# Patient Record
Sex: Male | Born: 1975 | Race: White | Hispanic: No | Marital: Single | Smoking: Former smoker
Health system: Southern US, Community
[De-identification: ages and names within clinical notes are randomized; demographics above are authoritative.]

## PROBLEM LIST (undated history)

## (undated) DIAGNOSIS — F419 Anxiety disorder, unspecified: Secondary | ICD-10-CM

## (undated) DIAGNOSIS — F200 Paranoid schizophrenia: Secondary | ICD-10-CM

## (undated) DIAGNOSIS — J45909 Unspecified asthma, uncomplicated: Secondary | ICD-10-CM

## (undated) DIAGNOSIS — F449 Dissociative and conversion disorder, unspecified: Secondary | ICD-10-CM

## (undated) DIAGNOSIS — F32A Depression, unspecified: Secondary | ICD-10-CM

## (undated) HISTORY — DX: Depression, unspecified: F32.A

## (undated) HISTORY — DX: Unspecified asthma, uncomplicated: J45.909

---

## 2007-11-18 ENCOUNTER — Emergency Department (HOSPITAL_COMMUNITY): Admission: EM | Admit: 2007-11-18 | Discharge: 2007-11-18 | Payer: Self-pay | Admitting: Emergency Medicine

## 2010-11-03 LAB — CBC
HCT: 42.5
Hemoglobin: 14.3
MCHC: 33.5
MCV: 89.4
Platelets: 267
RBC: 4.76
RDW: 12.2
WBC: 7.1

## 2010-11-03 LAB — DIFFERENTIAL
Basophils Absolute: 0
Basophils Relative: 0
Eosinophils Absolute: 0.2
Eosinophils Relative: 3
Lymphocytes Relative: 11 — ABNORMAL LOW
Lymphs Abs: 0.8
Monocytes Absolute: 0.4
Monocytes Relative: 6
Neutro Abs: 5.7
Neutrophils Relative %: 80 — ABNORMAL HIGH

## 2010-11-03 LAB — MONONUCLEOSIS SCREEN: Mono Screen: NEGATIVE

## 2014-02-13 ENCOUNTER — Emergency Department (HOSPITAL_COMMUNITY)
Admission: EM | Admit: 2014-02-13 | Discharge: 2014-02-13 | Disposition: A | Discharge status: Home / Self Care | Payer: Self-pay | Attending: Emergency Medicine | Admitting: Emergency Medicine

## 2014-02-13 ENCOUNTER — Encounter (HOSPITAL_COMMUNITY): Payer: Self-pay | Admitting: Emergency Medicine

## 2014-02-13 ENCOUNTER — Emergency Department (HOSPITAL_COMMUNITY): Payer: Self-pay

## 2014-02-13 DIAGNOSIS — R058 Other specified cough: Secondary | ICD-10-CM

## 2014-02-13 DIAGNOSIS — J40 Bronchitis, not specified as acute or chronic: Secondary | ICD-10-CM

## 2014-02-13 DIAGNOSIS — Z87891 Personal history of nicotine dependence: Secondary | ICD-10-CM | POA: Insufficient documentation

## 2014-02-13 DIAGNOSIS — R05 Cough: Secondary | ICD-10-CM

## 2014-02-13 DIAGNOSIS — J209 Acute bronchitis, unspecified: Secondary | ICD-10-CM | POA: Insufficient documentation

## 2014-02-13 DIAGNOSIS — J3489 Other specified disorders of nose and nasal sinuses: Secondary | ICD-10-CM

## 2014-02-13 MED ORDER — AZITHROMYCIN 250 MG PO TABS: ORAL_TABLET | ORAL | Status: DC

## 2014-02-13 NOTE — Discharge Instructions (Signed)
Continue to stay well-hydrated. Continue to alternate between Tylenol and Ibuprofen for pain or fever. Use Mucinex for cough suppression/expectoration of mucus. Use netipot and flonase to help with nasal congestion. May consider over-the-counter Benadryl or other antihistamine to decrease secretions and for watery itchy eyes. Take antibiotic as directed. Followup with your primary care doctor in 5-7 days for recheck of ongoing symptoms. Return to emergency department for emergent changing or worsening of symptoms.   Cough, Adult  A cough is a reflex. It helps you clear your throat and airways. A cough can help heal your body. A cough can last 2 or 3 weeks (acute) or may last more than 8 weeks (chronic). Some common causes of a cough can include an infection, allergy, or a cold. HOME CARE  Only take medicine as told by your doctor.  If given, take your medicines (antibiotics) as told. Finish them even if you start to feel better.  Use a cold steam vaporizer or humidifier in your home. This can help loosen thick spit (secretions).  Sleep so you are almost sitting up (semi-upright). Use pillows to do this. This helps reduce coughing.  Rest as needed.  Stop smoking if you smoke. GET HELP RIGHT AWAY IF:  You have yellowish-white fluid (pus) in your thick spit.  Your cough gets worse.  Your medicine does not reduce coughing, and you are losing sleep.  You cough up blood.  You have trouble breathing.  Your pain gets worse and medicine does not help.  You have a fever. MAKE SURE YOU:   Understand these instructions.  Will watch your condition.  Will get help right away if you are not doing well or get worse. Document Released: 10/01/2010 Document Revised: 06/04/2013 Document Reviewed: 10/01/2010 [facility] Patient Information 2015 [place], [place]. This information is not intended to replace advice given to you by your health care provider. Make sure you discuss any questions you have  with your health care provider.  Upper Respiratory Infection, Adult An upper respiratory infection (URI) is also known as the common cold. It is often caused by a type of germ (virus). Colds are easily spread (contagious). You can pass it to others by kissing, coughing, sneezing, or drinking out of the same glass. Usually, you get better in 1 or 2 weeks.  HOME CARE   Only take medicine as told by your doctor.  Use a warm mist humidifier or breathe in steam from a hot shower.  Drink enough water and fluids to keep your pee (urine) clear or pale yellow.  Get plenty of rest.  Return to work when your temperature is back to normal or as told by your doctor. You may use a face mask and wash your hands to stop your cold from spreading. GET HELP RIGHT AWAY IF:   After the first few days, you feel you are getting worse.  You have questions about your medicine.  You have chills, shortness of breath, or brown or red spit (mucus).  You have yellow or brown snot (nasal discharge) or pain in the face, especially when you bend forward.  You have a fever, puffy (swollen) neck, pain when you swallow, or white spots in the back of your throat.  You have a bad headache, ear pain, sinus pain, or chest pain.  You have a high-pitched whistling sound when you breathe in and out (wheezing).  You have a lasting cough or cough up blood.  You have sore muscles or a stiff neck. MAKE SURE  YOU:   Understand these instructions.  Will watch your condition.  Will get help right away if you are not doing well or get worse. Document Released: 07/07/2007 Document Revised: 04/12/2011 Document Reviewed: 04/25/2013 [facility] Patient Information 2015 [place], [place]. This information is not intended to replace advice given to you by your health care provider. Make sure you discuss any questions you have with your health care provider.

## 2014-02-13 NOTE — ED Notes (Signed)
Pt c/o productive cough x 2 weeks that has green sputum; pt denies fever

## 2014-02-13 NOTE — ED Provider Notes (Signed)
CSN: [id number]     Arrival date & time 02/13/14  1148 History  This chart was scribed for non-physician practitioner, Zacarias Pontes, PA-C working with Fredia Sorrow, MD by Frederich Balding, ED scribe. This patient was seen in room TR09C/TR09C and the patient's care was started at 12:21 PM.    Chief Complaint  Patient presents with  . Cough   Patient is a 39 y.o. male presenting with cough. The history is provided by the patient. No language interpreter was used.  Cough Cough characteristics:  Productive Sputum characteristics:  Yellow and brown Severity:  Moderate Onset quality:  Gradual Duration:  3 weeks Timing:  Intermittent Progression:  Unchanged Chronicity:  New Relieved by:  Nothing Worsened by:  Lying down Ineffective treatments: mucinex. Associated symptoms: rhinorrhea   Associated symptoms: no chest pain, no chills, no ear pain, no fever, no myalgias, no rash, no shortness of breath, no sore throat and no wheezing   Rhinorrhea:    Quality:  Yellow   Duration:  3 weeks   Timing:  Intermittent   Progression:  Unchanged   HPI Comments: Isaiah Black is a 39 y.o. male with history of seasonal allergies who presents to the Emergency Department complaining of productive cough of yellow brown sputum that started 3 weeks ago. Cough is worse in the mornings and when he lays down. Also reports yellow rhinorrhea. He has taken generic mucinex with no relief. Denies fever, chills, ear pain, ear discharge, sore throat, chest pain, SOB, wheezing, DOE, abdominal pain, nausea, emesis, diarrhea, or LE swelling. Pt's mother-in-law was recently sick with similar symptoms. He states he "vapes every now and then" but does not smoke cigarettes. Denies history of asthma or COPD.   History reviewed. No pertinent past medical history. History reviewed. No pertinent past surgical history. History reviewed. No pertinent family history. History  Substance Use Topics  . Smoking status:  Former Research scientist (life sciences)  . Smokeless tobacco: Not on file  . Alcohol Use: Yes     Comment: occ    Review of Systems  Constitutional: Negative for fever and chills.  HENT: Positive for rhinorrhea. Negative for ear discharge, ear pain, sinus pressure, sore throat and trouble swallowing.   Respiratory: Positive for cough. Negative for shortness of breath and wheezing.   Cardiovascular: Negative for chest pain and leg swelling.  Gastrointestinal: Negative for nausea, vomiting, abdominal pain and diarrhea.  Musculoskeletal: Negative for myalgias and arthralgias.  Skin: Negative for rash.  Allergic/Immunologic: Positive for environmental allergies. Negative for immunocompromised state.  Neurological: Negative for weakness, light-headedness and numbness.  10 systems reviewed and are negative for acute changes except as noted in the HPI.  Allergies  Review of patient's allergies indicates no known allergies.  Home Medications   Prior to Admission medications   Not on File   BP 118/74 mmHg  Pulse 111  Temp(Src) 98.1 F (36.7 C) (Oral)  Resp 18  SpO2 99%   Physical Exam  Constitutional: He is oriented to person, place, and time. Vital signs are normal. He appears well-developed and well-nourished.  Non-toxic appearance. No distress.  Afebrile, non toxic, NAD, mildly tachycardic which resolved during exam.  HENT:  Head: Normocephalic and atraumatic.  Right Ear: Hearing, tympanic membrane, external ear and ear canal normal.  Left Ear: Hearing, tympanic membrane, external ear and ear canal normal.  Nose: Nose normal. No mucosal edema or rhinorrhea.  Mouth/Throat: Uvula is midline, oropharynx is clear and moist and mucous membranes are normal. No trismus in the jaw.  No uvula swelling.  Bilateral ears clear. Nose clear. Oropharynx clear and moist without pharyngeal erythema or tonsillar swelling, no exudates.  Eyes: Conjunctivae and EOM are normal. Right eye exhibits no discharge. Left eye exhibits  no discharge.  Neck: Normal range of motion. Neck supple.  Cardiovascular: Normal rate, regular rhythm, normal heart sounds and intact distal pulses.  Exam reveals no gallop and no friction rub.   No murmur heard. Pulmonary/Chest: Effort normal. No respiratory distress. He has no decreased breath sounds. He has no wheezes. He has rhonchi in the left lower field. He has no rales.  Scant rhonchi in LLF that clears with cough. No wheezes or rales, no decreased breath sounds. No increased WOB. No hypoxia. Speaking in full sentences.   Abdominal: Soft. Normal appearance and bowel sounds are normal. He exhibits no distension. There is no tenderness. There is no rigidity, no rebound and no guarding.  Soft, NTND, +BS throughout, no r/g/r  Musculoskeletal: Normal range of motion.  MAE x4  Lymphadenopathy:       Head (right side): No submandibular and no tonsillar adenopathy present.       Head (left side): No submandibular and no tonsillar adenopathy present.    He has no cervical adenopathy.  No head or neck lymphadenopathy.  Neurological: He is alert and oriented to person, place, and time. He has normal strength. No sensory deficit. Gait normal.  Skin: Skin is warm, dry and intact. No rash noted.  Psychiatric: He has a normal mood and affect. His behavior is normal.  Nursing note and vitals reviewed.   ED Course  Procedures (including critical care time)  DIAGNOSTIC STUDIES: Oxygen Saturation is 99% on RA, normal by my interpretation.    COORDINATION OF CARE: 12:25 PM-Discussed treatment plan which includes chest xray with pt at bedside and pt agreed to plan.   Labs Review Labs Reviewed - No data to display  Imaging Review Dg Chest 2 View  02/13/2014   CLINICAL DATA:  Cough, productive sputum for 3 weeks  EXAM: CHEST  2 VIEW  COMPARISON:  None.  FINDINGS: Cardiomediastinal silhouette is unremarkable. No acute infiltrate or pleural effusion. No pulmonary edema. Bony thorax is  unremarkable.  IMPRESSION: No active cardiopulmonary disease.   Electronically Signed   By: Lahoma Crocker M.D.   On: 02/13/2014 13:24     EKG Interpretation None      MDM   Final diagnoses:  Cough productive of purulent sputum  Rhinorrhea  Bronchitis    39 y.o. male with productive cough x3 weeks. Pt is afebrile with slight LLF rhonchi on lung exam that clears with cough. No hypoxia or increased WOB. Discussed that this is Likely viral URI vs seasonal allergies, but given duration of symptoms and not getting better, agreed to start abx. CXR obtained which was neg for PNA. No wheezing, no hypoxia, no need for breathing tx. No need for prednisone. Pt is agreeable to symptomatic treatment for cough with OTC meds, and close follow up with PCP as needed but spoke at length about emergent changing or worsening of symptoms that should prompt return to ER. Pt voices understanding and is agreeable to plan. Stable at time of discharge.   BP 118/74 mmHg  Pulse 111  Temp(Src) 98.1 F (36.7 C) (Oral)  Resp 18  SpO2 99%  Meds ordered this encounter  Medications  . azithromycin (ZITHROMAX Z-PAK) 250 MG tablet    Sig: 2 po day one, then 1 daily x 4 days  Dispense:  5 tablet    Refill:  0    Order Specific Question:  Supervising Provider    Answer:  Noemi Chapel D [[id number]]     I personally performed the services described in this documentation, which was scribed in my presence. The recorded information has been reviewed and is accurate.  Patty Sermons [place], [place] 02/13/14 [place], MD 02/14/14 [phone]

## 2014-04-04 ENCOUNTER — Emergency Department (HOSPITAL_COMMUNITY)
Admission: EM | Admit: 2014-04-04 | Discharge: 2014-04-04 | Disposition: A | Discharge status: Home / Self Care | Payer: Self-pay | Attending: Emergency Medicine | Admitting: Emergency Medicine

## 2014-04-04 ENCOUNTER — Emergency Department (HOSPITAL_COMMUNITY): Payer: Self-pay

## 2014-04-04 ENCOUNTER — Encounter (HOSPITAL_COMMUNITY): Payer: Self-pay | Admitting: *Deleted

## 2014-04-04 DIAGNOSIS — J4520 Mild intermittent asthma, uncomplicated: Secondary | ICD-10-CM | POA: Insufficient documentation

## 2014-04-04 DIAGNOSIS — Z87891 Personal history of nicotine dependence: Secondary | ICD-10-CM | POA: Insufficient documentation

## 2014-04-04 DIAGNOSIS — J069 Acute upper respiratory infection, unspecified: Secondary | ICD-10-CM | POA: Insufficient documentation

## 2014-04-04 DIAGNOSIS — R1110 Vomiting, unspecified: Secondary | ICD-10-CM | POA: Insufficient documentation

## 2014-04-04 DIAGNOSIS — Z792 Long term (current) use of antibiotics: Secondary | ICD-10-CM | POA: Insufficient documentation

## 2014-04-04 LAB — BASIC METABOLIC PANEL
ANION GAP: 5 (ref 5–15)
BUN: 13 mg/dL (ref 6–23)
CHLORIDE: 101 mmol/L (ref 96–112)
CO2: 33 mmol/L — ABNORMAL HIGH (ref 19–32)
CREATININE: 1.01 mg/dL (ref 0.50–1.35)
Calcium: 8.5 mg/dL (ref 8.4–10.5)
Glucose, Bld: 113 mg/dL — ABNORMAL HIGH (ref 70–99)
POTASSIUM: 4.3 mmol/L (ref 3.5–5.1)
Sodium: 139 mmol/L (ref 135–145)

## 2014-04-04 LAB — CBC WITH DIFFERENTIAL/PLATELET
BASOS ABS: 0.1 10*3/uL (ref 0.0–0.1)
BASOS PCT: 1 % (ref 0–1)
EOS ABS: 0.4 10*3/uL (ref 0.0–0.7)
Eosinophils Relative: 5 % (ref 0–5)
HCT: 42.6 % (ref 39.0–52.0)
Hemoglobin: 15 g/dL (ref 13.0–17.0)
Lymphocytes Relative: 13 % (ref 12–46)
Lymphs Abs: 1.1 10*3/uL (ref 0.7–4.0)
MCH: 30.5 pg (ref 26.0–34.0)
MCHC: 35.2 g/dL (ref 30.0–36.0)
MCV: 86.8 fL (ref 78.0–100.0)
MONOS PCT: 7 % (ref 3–12)
Monocytes Absolute: 0.6 10*3/uL (ref 0.1–1.0)
NEUTROS ABS: 6.3 10*3/uL (ref 1.7–7.7)
NEUTROS PCT: 74 % (ref 43–77)
PLATELETS: 291 10*3/uL (ref 150–400)
RBC: 4.91 MIL/uL (ref 4.22–5.81)
RDW: 12 % (ref 11.5–15.5)
WBC: 8.5 10*3/uL (ref 4.0–10.5)

## 2014-04-04 MED ORDER — AEROCHAMBER PLUS W/MASK MISC
1.0000 | Freq: Once | Status: AC
  Administered 2014-04-04: 1
  Filled 2014-04-04: qty 1

## 2014-04-04 MED ORDER — PREDNISONE 20 MG PO TABS
50.0000 mg | ORAL_TABLET | Freq: Every day | ORAL | Status: DC
  Administered 2014-04-04: 50 mg via ORAL
  Filled 2014-04-04: qty 3

## 2014-04-04 MED ORDER — GUAIFENESIN-CODEINE 100-10 MG/5ML PO SOLN: 5.0000 mL | Freq: Four times a day (QID) | ORAL | Status: DC | PRN

## 2014-04-04 MED ORDER — ALBUTEROL SULFATE HFA 108 (90 BASE) MCG/ACT IN AERS
2.0000 | INHALATION_SPRAY | Freq: Once | RESPIRATORY_TRACT | Status: AC
  Administered 2014-04-04: 2 via RESPIRATORY_TRACT
  Filled 2014-04-04: qty 6.7

## 2014-04-04 MED ORDER — PREDNISONE 20 MG PO TABS: 40.0000 mg | ORAL_TABLET | Freq: Every day | ORAL | Status: DC

## 2014-04-04 NOTE — Discharge Instructions (Signed)
Upper Respiratory Infection, Adult An upper respiratory infection (URI) is also sometimes known as the common cold. The upper respiratory tract includes the nose, sinuses, throat, trachea, and bronchi. Bronchi are the airways leading to the lungs. Most people improve within 1 week, but symptoms can last up to 2 weeks. A residual cough may last even longer.  CAUSES Many different viruses can infect the tissues lining the upper respiratory tract. The tissues become irritated and inflamed and often become very moist. Mucus production is also common. A cold is contagious. You can easily spread the virus to others by oral contact. This includes kissing, sharing a glass, coughing, or sneezing. Touching your mouth or nose and then touching a surface, which is then touched by another person, can also spread the virus. SYMPTOMS  Symptoms typically develop 1 to 3 days after you come in contact with a cold virus. Symptoms vary from person to person. They may include:  Runny nose.  Sneezing.  Nasal congestion.  Sinus irritation.  Sore throat.  Loss of voice (laryngitis).  Cough.  Fatigue.  Muscle aches.  Loss of appetite.  Headache.  Low-grade fever. DIAGNOSIS  You might diagnose your own cold based on familiar symptoms, since most people get a cold 2 to 3 times a year. Your caregiver can confirm this based on your exam. Most importantly, your caregiver can check that your symptoms are not due to another disease such as strep throat, sinusitis, pneumonia, asthma, or epiglottitis. Blood tests, throat tests, and X-rays are not necessary to diagnose a common cold, but they may sometimes be helpful in excluding other more serious diseases. Your caregiver will decide if any further tests are required. RISKS AND COMPLICATIONS  You may be at risk for a more severe case of the common cold if you smoke cigarettes, have chronic heart disease (such as heart failure) or lung disease (such as asthma), or if  you have a weakened immune system. The very young and very old are also at risk for more serious infections. Bacterial sinusitis, middle ear infections, and bacterial pneumonia can complicate the common cold. The common cold can worsen asthma and chronic obstructive pulmonary disease (COPD). Sometimes, these complications can require emergency medical care and may be life-threatening. PREVENTION  The best way to protect against getting a cold is to practice good hygiene. Avoid oral or hand contact with people with cold symptoms. Wash your hands often if contact occurs. There is no clear evidence that vitamin C, vitamin E, echinacea, or exercise reduces the chance of developing a cold. However, it is always recommended to get plenty of rest and practice good nutrition. TREATMENT  Treatment is directed at relieving symptoms. There is no cure. Antibiotics are not effective, because the infection is caused by a virus, not by bacteria. Treatment may include:  Increased fluid intake. Sports drinks offer valuable electrolytes, sugars, and fluids.  Breathing heated mist or steam (vaporizer or shower).  Eating chicken soup or other clear broths, and maintaining good nutrition.  Getting plenty of rest.  Using gargles or lozenges for comfort.  Controlling fevers with ibuprofen or acetaminophen as directed by your caregiver.  Increasing usage of your inhaler if you have asthma. Zinc gel and zinc lozenges, taken in the first 24 hours of the common cold, can shorten the duration and lessen the severity of symptoms. Pain medicines may help with fever, muscle aches, and throat pain. A variety of non-prescription medicines are available to treat congestion and runny nose. Your caregiver  can make recommendations and may suggest nasal or lung inhalers for other symptoms.  HOME CARE INSTRUCTIONS   Only take over-the-counter or prescription medicines for pain, discomfort, or fever as directed by your  caregiver.  Use a warm mist humidifier or inhale steam from a shower to increase air moisture. This may keep secretions moist and make it easier to breathe.  Drink enough water and fluids to keep your urine clear or pale yellow.  Rest as needed.  Return to work when your temperature has returned to normal or as your caregiver advises. You may need to stay home longer to avoid infecting others. You can also use a face mask and careful hand washing to prevent spread of the virus. SEEK MEDICAL CARE IF:   After the first few days, you feel you are getting worse rather than better.  You need your caregiver's advice about medicines to control symptoms.  You develop chills, worsening shortness of breath, or brown or red sputum. These may be signs of pneumonia.  You develop yellow or brown nasal discharge or pain in the face, especially when you bend forward. These may be signs of sinusitis.  You develop a fever, swollen neck glands, pain with swallowing, or white areas in the back of your throat. These may be signs of strep throat. SEEK IMMEDIATE MEDICAL CARE IF:   You have a fever.  You develop severe or persistent headache, ear pain, sinus pain, or chest pain.  You develop wheezing, a prolonged cough, cough up blood, or have a change in your usual mucus (if you have chronic lung disease).  You develop sore muscles or a stiff neck. Document Released: 07/14/2000 Document Revised: 04/12/2011 Document Reviewed: 04/25/2013 [facility] Patient Information 2015 [place], [place]. This information is not intended to replace advice given to you by your health care provider. Make sure you discuss any questions you have with your health care provider.   How to Use an Inhaler Proper inhaler technique is very important. Good technique ensures that the medicine reaches the lungs. Poor technique results in depositing the medicine on the tongue and back of the throat rather than in the airways. If you do not  use the inhaler with good technique, the medicine will not help you. STEPS TO FOLLOW IF USING AN INHALER WITHOUT AN EXTENSION TUBE  Remove the cap from the inhaler.  If you are using the inhaler for the first time, you will need to prime it. Shake the inhaler for 5 seconds and release four puffs into the air, away from your face. Ask your health care provider or pharmacist if you have questions about priming your inhaler.  Shake the inhaler for 5 seconds before each breath in (inhalation).  Position the inhaler so that the top of the canister faces up.  Put your index finger on the top of the medicine canister. Your thumb supports the bottom of the inhaler.  Open your mouth.  Either place the inhaler between your teeth and place your lips tightly around the mouthpiece, or hold the inhaler 1-2 inches away from your open mouth. If you are unsure of which technique to use, ask your health care provider.  Breathe out (exhale) normally and as completely as possible.  Press the canister down with your index finger to release the medicine.  At the same time as the canister is pressed, inhale deeply and slowly until your lungs are completely filled. This should take 4-6 seconds. Keep your tongue down.  Hold the medicine  in your lungs for 5-10 seconds (10 seconds is best). This helps the medicine get into the small airways of your lungs.  Breathe out slowly, through pursed lips. Whistling is an example of pursed lips.  Wait at least 15-30 seconds between puffs. Continue with the above steps until you have taken the number of puffs your health care provider has ordered. Do not use the inhaler more than your health care provider tells you.  Replace the cap on the inhaler.  Follow the directions from your health care provider or the inhaler insert for cleaning the inhaler. STEPS TO FOLLOW IF USING AN INHALER WITH AN EXTENSION (SPACER)  Remove the cap from the inhaler.  If you are using the  inhaler for the first time, you will need to prime it. Shake the inhaler for 5 seconds and release four puffs into the air, away from your face. Ask your health care provider or pharmacist if you have questions about priming your inhaler.  Shake the inhaler for 5 seconds before each breath in (inhalation).  Place the open end of the spacer onto the mouthpiece of the inhaler.  Position the inhaler so that the top of the canister faces up and the spacer mouthpiece faces you.  Put your index finger on the top of the medicine canister. Your thumb supports the bottom of the inhaler and the spacer.  Breathe out (exhale) normally and as completely as possible.  Immediately after exhaling, place the spacer between your teeth and into your mouth. Close your lips tightly around the spacer.  Press the canister down with your index finger to release the medicine.  At the same time as the canister is pressed, inhale deeply and slowly until your lungs are completely filled. This should take 4-6 seconds. Keep your tongue down and out of the way.  Hold the medicine in your lungs for 5-10 seconds (10 seconds is best). This helps the medicine get into the small airways of your lungs. Exhale.  Repeat inhaling deeply through the spacer mouthpiece. Again hold that breath for up to 10 seconds (10 seconds is best). Exhale slowly. If it is difficult to take this second deep breath through the spacer, breathe normally several times through the spacer. Remove the spacer from your mouth.  Wait at least 15-30 seconds between puffs. Continue with the above steps until you have taken the number of puffs your health care provider has ordered. Do not use the inhaler more than your health care provider tells you.  Remove the spacer from the inhaler, and place the cap on the inhaler.  Follow the directions from your health care provider or the inhaler insert for cleaning the inhaler and spacer. If you are using different  kinds of inhalers, use your quick relief medicine to open the airways 10-15 minutes before using a steroid if instructed to do so by your health care provider. If you are unsure which inhalers to use and the order of using them, ask your health care provider, nurse, or respiratory therapist. If you are using a steroid inhaler, always rinse your mouth with water after your last puff, then gargle and spit out the water. Do not swallow the water. AVOID:  Inhaling before or after starting the spray of medicine. It takes practice to coordinate your breathing with triggering the spray.  Inhaling through the nose (rather than the mouth) when triggering the spray. HOW TO DETERMINE IF YOUR INHALER IS FULL OR NEARLY EMPTY You cannot know when an  inhaler is empty by shaking it. A few inhalers are now being made with dose counters. Ask your health care provider for a prescription that has a dose counter if you feel you need that extra help. If your inhaler does not have a counter, ask your health care provider to help you determine the date you need to refill your inhaler. Write the refill date on a calendar or your inhaler canister. Refill your inhaler 7-10 days before it runs out. Be sure to keep an adequate supply of medicine. This includes making sure it is not expired, and that you have a spare inhaler.  SEEK MEDICAL CARE IF:   Your symptoms are only partially relieved with your inhaler.  You are having trouble using your inhaler.  You have some increase in phlegm. SEEK IMMEDIATE MEDICAL CARE IF:   You feel little or no relief with your inhalers. You are still wheezing and are feeling shortness of breath or tightness in your chest or both.  You have dizziness, headaches, or a fast heart rate.  You have chills, fever, or night sweats.  You have a noticeable increase in phlegm production, or there is blood in the phlegm. MAKE SURE YOU:   Understand these instructions.  Will watch your  condition.  Will get help right away if you are not doing well or get worse. Document Released: 01/16/2000 Document Revised: 11/08/2012 Document Reviewed: 08/17/2012 [facility] Patient Information 2015 [place], [place]. This information is not intended to replace advice given to you by your health care provider. Make sure you discuss any questions you have with your health care provider.  Bronchospasm A bronchospasm is a spasm or tightening of the airways going into the lungs. During a bronchospasm breathing becomes more difficult because the airways get smaller. When this happens there can be coughing, a whistling sound when breathing (wheezing), and difficulty breathing. Bronchospasm is often associated with asthma, but not all patients who experience a bronchospasm have asthma. CAUSES  A bronchospasm is caused by inflammation or irritation of the airways. The inflammation or irritation may be triggered by:   Allergies (such as to animals, pollen, food, or mold). Allergens that cause bronchospasm may cause wheezing immediately after exposure or many hours later.   Infection. Viral infections are believed to be the most common cause of bronchospasm.   Exercise.   Irritants (such as pollution, cigarette smoke, strong odors, aerosol sprays, and paint fumes).   Weather changes. Winds increase molds and pollens in the air. Rain refreshes the air by washing irritants out. Cold air may cause inflammation.   Stress and emotional upset.  SIGNS AND SYMPTOMS   Wheezing.   Excessive nighttime coughing.   Frequent or severe coughing with a simple cold.   Chest tightness.   Shortness of breath.  DIAGNOSIS  Bronchospasm is usually diagnosed through a history and physical exam. Tests, such as chest X-rays, are sometimes done to look for other conditions. TREATMENT   Inhaled medicines can be given to open up your airways and help you breathe. The medicines can be given using either an  inhaler or a nebulizer machine.  Corticosteroid medicines may be given for severe bronchospasm, usually when it is associated with asthma. HOME CARE INSTRUCTIONS   Always have a plan prepared for seeking medical care. Know when to call your health care provider and local emergency services (911 in the [place] Know where you can access local emergency care.  Only take medicines as directed by your health care provider.  If you were prescribed an inhaler or nebulizer machine, ask your health care provider to explain how to use it correctly. Always use a spacer with your inhaler if you were given one.  It is necessary to remain calm during an attack. Try to relax and breathe more slowly.  Control your home environment in the following ways:   Change your heating and air conditioning filter at least once a month.   Limit your use of fireplaces and wood stoves.  Do not smoke and do not allow smoking in your home.   Avoid exposure to perfumes and fragrances.   Get rid of pests (such as roaches and mice) and their droppings.   Throw away plants if you see mold on them.   Keep your house clean and dust free.   Replace carpet with wood, tile, or vinyl flooring. Carpet can trap dander and dust.   Use allergy-proof pillows, mattress covers, and box spring covers.   Wash bed sheets and blankets every week in hot water and dry them in a dryer.   Use blankets that are made of polyester or cotton.   Wash hands frequently. SEEK MEDICAL CARE IF:   You have muscle aches.   You have chest pain.   The sputum changes from clear or white to yellow, green, gray, or bloody.   The sputum you cough up gets thicker.   There are problems that may be related to the medicine you are given, such as a rash, itching, swelling, or trouble breathing.  SEEK IMMEDIATE MEDICAL CARE IF:   You have worsening wheezing and coughing even after taking your prescribed medicines.   You have  increased difficulty breathing.   You develop severe chest pain. MAKE SURE YOU:   Understand these instructions.  Will watch your condition.  Will get help right away if you are not doing well or get worse. Document Released: 01/21/2003 Document Revised: 01/23/2013 Document Reviewed: 07/10/2012 [facility] Patient Information 2015 [place], [place]. This information is not intended to replace advice given to you by your health care provider. Make sure you discuss any questions you have with your health care provider.

## 2014-04-04 NOTE — ED Notes (Signed)
Pt in c/o cough and congestion for the last few days, unsure of fever, post tussive emesis at times, no distress noted

## 2014-04-04 NOTE — ED Provider Notes (Signed)
CSN: [id number]     Arrival date & time 04/04/14  1531 History  This chart was scribed for non-physician practitioner, Margarita Mail PA,  working with No att. providers found, by Eustaquio Maize, ED Scribe. This patient was seen in room TR09C/TR09C and the patient's care was started at 5:07 PM.   Chief Complaint  Patient presents with  . Cough   The history is provided by the patient. No language interpreter was used.     HPI Comments: Isaiah Black is a 39 y.o. male who presents to the Emergency Department complaining of a productive cough that began approximately 5 days ago. He mentions that he has had greenish yellow colored phlegm with the cough. Pt states that the coughing is worse at night time. He also complains of post tussive vomiting and reports that he has vomited twice since his symptoms began. Pt also reports that he has a tightness in his chest after coughing along with mild wheezing. Pt claims that he feels a "tickle" in his throat and cannot stop coughing. He states that in the first day of his symptoms, he was coughing up a white foaming substance which changed to the greenish yellow phlegm. He denies fever, chills, or any other symptoms. Pt had previous similar symptoms 1 month ago with normal CXR. Admits to using e-cigarettes. Admits to family history of asthma.   History reviewed. No pertinent past medical history. History reviewed. No pertinent past surgical history. History reviewed. No pertinent family history. History  Substance Use Topics  . Smoking status: Former Research scientist (life sciences)  . Smokeless tobacco: Not on file  . Alcohol Use: Yes     Comment: occ    Review of Systems A complete 10 system review of systems was obtained and all systems are negative except as noted in the HPI and PMH.     Allergies  Review of patient's allergies indicates no known allergies.  Home Medications   Prior to Admission medications   Medication Sig Start Date End Date Taking? Authorizing  Provider  azithromycin (ZITHROMAX Z-PAK) 250 MG tablet 2 po day one, then 1 daily x 4 days 02/13/14   Mercedes Strupp Camprubi-Soms, PA-C  guaiFENesin-codeine 100-10 MG/5ML syrup Take 5-10 mLs by mouth every 6 (six) hours as needed for cough. 04/04/14   Margarita Mail, PA-C  predniSONE (DELTASONE) 20 MG tablet Take 2 tablets (40 mg total) by mouth daily. 04/04/14   Margarita Mail, PA-C   Triage Vital: BP 137/86 mmHg  Pulse 117  Temp(Src) 98.4 F (36.9 C)  Resp 20  Wt 119 lb 5 oz (54.12 kg)  SpO2 96%  Physical Exam  Constitutional: He is oriented to person, place, and time. He appears well-developed and well-nourished. No distress.  HENT:  Head: Normocephalic and atraumatic.  Eyes: Conjunctivae and EOM are normal.  Neck: Neck supple. No tracheal deviation present.  Cardiovascular: Normal rate.   Pulmonary/Chest: Effort normal. No respiratory distress.  Tight cough. No wheezes. No accessory muscles.   Musculoskeletal: Normal range of motion.  Neurological: He is alert and oriented to person, place, and time.  Skin: Skin is warm and dry.  Psychiatric: He has a normal mood and affect. His behavior is normal.  Nursing note and vitals reviewed.   ED Course  Procedures (including critical care time)  DIAGNOSTIC STUDIES: Oxygen Saturation is 96% on RA, normal by my interpretation.    COORDINATION OF CARE:   5:12 PM-Discussed treatment plan which includes CXR with pt at bedside and pt agreed to  plan.   Labs Review Labs Reviewed  BASIC METABOLIC PANEL - Abnormal; Notable for the following:    CO2 33 (*)    Glucose, Bld 113 (*)    All other components within normal limits  CBC WITH DIFFERENTIAL/PLATELET    Imaging Review Dg Chest 2 View  04/04/2014   CLINICAL DATA:  Productive cough for 5 days  EXAM: CHEST  2 VIEW  COMPARISON:  02/13/2014  FINDINGS: The heart size and mediastinal contours are within normal limits. Both lungs are clear. The visualized skeletal structures are  unremarkable.  IMPRESSION: No active cardiopulmonary disease.   Electronically Signed   By: Inez Catalina M.D.   On: 04/04/2014 16:34     EKG Interpretation None      MDM   Final diagnoses:  URI (upper respiratory infection)  RAD (reactive airway disease), mild intermittent, uncomplicated   Pt CXR negative for acute infiltrate. Patients symptoms are consistent with URI, likely viral etiology. Discussed that antibiotics are not indicated for viral infections. Pt will be discharged with symptomatic treatment.  Verbalizes understanding and is agreeable with plan. Pt is hemodynamically stable & in NAD prior to dc.  Patient will be discharged with prednisone, codeine cough syrup and albuterol  I personally performed the services described in this documentation, which was scribed in my presence. The recorded information has been reviewed and is accurate.        Margarita Mail, PA-C 04/04/14 1823  Blanchie Dessert, MD 04/04/14 [phone]

## 2016-05-15 ENCOUNTER — Encounter (HOSPITAL_COMMUNITY): Payer: Self-pay | Admitting: Emergency Medicine

## 2016-05-15 ENCOUNTER — Emergency Department (HOSPITAL_COMMUNITY)
Admission: EM | Admit: 2016-05-15 | Discharge: 2016-05-16 | Disposition: A | Discharge status: Home / Self Care | Payer: Medicare Other | Attending: Emergency Medicine | Admitting: Emergency Medicine

## 2016-05-15 DIAGNOSIS — Y999 Unspecified external cause status: Secondary | ICD-10-CM | POA: Diagnosis not present

## 2016-05-15 DIAGNOSIS — S0101XA Laceration without foreign body of scalp, initial encounter: Secondary | ICD-10-CM

## 2016-05-15 DIAGNOSIS — W228XXA Striking against or struck by other objects, initial encounter: Secondary | ICD-10-CM | POA: Insufficient documentation

## 2016-05-15 DIAGNOSIS — Z87891 Personal history of nicotine dependence: Secondary | ICD-10-CM | POA: Insufficient documentation

## 2016-05-15 DIAGNOSIS — Y939 Activity, unspecified: Secondary | ICD-10-CM | POA: Insufficient documentation

## 2016-05-15 DIAGNOSIS — Y929 Unspecified place or not applicable: Secondary | ICD-10-CM | POA: Insufficient documentation

## 2016-05-15 NOTE — ED Triage Notes (Signed)
Pt presents with head lac to right head, bleeding controlled, denies LOC; pt states was sitting, then went to stand striking head on mantle

## 2016-05-16 MED ORDER — IBUPROFEN 400 MG PO TABS
600.0000 mg | ORAL_TABLET | Freq: Once | ORAL | Status: AC
  Administered 2016-05-16: 01:00:00 via ORAL
  Filled 2016-05-16: qty 1

## 2016-05-16 NOTE — ED Notes (Signed)
ED Provider at bedside. 

## 2016-05-16 NOTE — ED Provider Notes (Signed)
[facility] DEPT Provider Note   CSN: [id number] Arrival date & time: 05/15/16  2315     History   Chief Complaint Chief Complaint  Patient presents with  . Head Laceration    HPI Isaiah Black is a 41 y.o. male presents today with complaints of head laceration to head since 21:30. Patient reports his pain as throbbing, sharp, gradually worsening, constant, 8/10. He reports associated nausea and tingling sensation to down both arms that lasted about 15 minutes and has resolved. Patient reports nothing makes symptoms better or worse. He has tried nothing for his symptoms. Patient denies LOC, vomiting, blurry vision, double vision, photophobia, changes in gait. Patient denies taking any blood thinners. Patient states his tetanus is UTD within that last 5 years. Patient reports he was sitting and upon stand up he hit his head on a mantle.   The history is provided by the patient. No language interpreter was used.  Head Laceration  Pertinent negatives include no chest pain and no shortness of breath.    History reviewed. No pertinent past medical history.  There are no active problems to display for this patient.   History reviewed. No pertinent surgical history.     Home Medications    Prior to Admission medications   Medication Sig Start Date End Date Taking? Authorizing Provider  azithromycin (ZITHROMAX Z-PAK) 250 MG tablet 2 po day one, then 1 daily x 4 days 02/13/14   [place], PA-C  guaiFENesin-codeine 100-10 MG/5ML syrup Take 5-10 mLs by mouth every 6 (six) hours as needed for cough. 04/04/14   Margarita Mail, PA-C  predniSONE (DELTASONE) 20 MG tablet Take 2 tablets (40 mg total) by mouth daily. 04/04/14   Margarita Mail, PA-C    Family History History reviewed. No pertinent family history.  Social History Social History  Substance Use Topics  . Smoking status: Former Research scientist (life sciences)  . Smokeless tobacco: Never Used     Comment: vape  . Alcohol use Yes     Comment:  occ     Allergies   Patient has no known allergies.   Review of Systems Review of Systems  Constitutional: Negative for chills and fever.  HENT:       Head pain, laceration, bleeding  Eyes: Negative for photophobia and visual disturbance.  Respiratory: Negative for shortness of breath.   Cardiovascular: Negative for chest pain.  Musculoskeletal: Negative for gait problem, neck pain and neck stiffness.  Neurological: Negative for dizziness, syncope, speech difficulty and weakness.     Physical Exam Updated Vital Signs BP 118/82   Pulse 96   Temp 98.4 F (36.9 C) (Oral)   Resp 14   Ht 5\' 6"  (1.676 m)   Wt 54.4 kg   SpO2 96%   BMI 19.37 kg/m   Physical Exam  Constitutional: He is oriented to person, place, and time. He appears well-developed and well-nourished.  Well appearing  HENT:  Head: Normocephalic and atraumatic.  Nose: Nose normal.  Mouth/Throat: Oropharynx is clear and moist.  About 1.5 cm laceration to top right of scalp. No active bleeding. Mildly tender to palpation. No obvious swelling or bruising. No other sign of bleeding, redness, swelling, deformity. Negative Battle sign. Negative raccoon sign.  Eyes: Conjunctivae and EOM are normal. Pupils are equal, round, and reactive to light.  Neck: Normal range of motion.  Cardiovascular: Normal rate.   Pulmonary/Chest: Effort normal. No respiratory distress.  Normal work of breathing. No respiratory distress noted.   Abdominal: Soft.  Musculoskeletal:  Normal range of motion.  Neurological: He is alert and oriented to person, place, and time. He has normal strength. No cranial nerve deficit or sensory deficit. GCS eye subscore is 4. GCS verbal subscore is 5. GCS motor subscore is 6.  Skin: Skin is warm.  Psychiatric: He has a normal mood and affect. His behavior is normal.  Nursing note and vitals reviewed.    ED Treatments / Results  Labs (all labs ordered are listed, but only abnormal results are  displayed) Labs Reviewed - No data to display  EKG  EKG Interpretation None       Radiology No results found.  Procedures .Marland KitchenLaceration Repair Date/Time: 05/16/2016 12:58 AM Performed by: Bettey Costa Authorized by: Bettey Costa   Consent:    Consent obtained:  Verbal   Consent given by:  Patient   Risks discussed:  Infection, pain, poor cosmetic result and poor wound healing   Alternatives discussed:  No treatment and delayed treatment Anesthesia (see MAR for exact dosages):    Anesthesia method:  None Laceration details:    Location:  Scalp   Scalp location:  R parietal   Length (cm):  1.5   Depth (mm):  2 Repair type:    Repair type:  Simple Exploration:    Hemostasis achieved with:  Direct pressure   Wound exploration: entire depth of wound probed and visualized     Wound extent: areolar tissue violated     Contaminated: no   Treatment:    Area cleansed with:  Saline   Amount of cleaning:  Standard   Irrigation solution:  Sterile saline   Irrigation volume:  20 cc   Irrigation method:  Pressure wash   Visualized foreign bodies/material removed: no   Skin repair:    Repair method:  Staples   Number of staples:  3 Approximation:    Approximation:  Close   Vermilion border: well-aligned   Post-procedure details:    Dressing:  Open (no dressing)   Patient tolerance of procedure:  Tolerated well, no immediate complications   (including critical care time)  Medications Ordered in ED Medications  ibuprofen (ADVIL,MOTRIN) tablet 600 mg (not administered)     Initial Impression / Assessment and Plan / ED Course  I have reviewed the triage vital signs and the nursing notes.  Pertinent labs & imaging results that were available during my care of the patient were reviewed by me and considered in my medical decision making (see chart for details).    Pressure irrigation performed. Wound explored and base of wound visualized in a  bloodless field without evidence of foreign body.  Laceration occurred < 8 hours prior to repair which was well tolerated. Tdap UTD.  Pt has no comorbidities to effect normal wound healing. I don't believe patient needs a CT at this time. Patient has no neurological symptoms. Pt discharged without antibiotics.  Discussed suture home care with patient and answered questions. Pt to follow-up for wound check and suture removal in 5- 7 days; they are to return to the ED sooner for signs of infection. Pt is hemodynamically stable with no complaints prior to dc.    Final Clinical Impressions(s) / ED Diagnoses   Final diagnoses:  Laceration of scalp, initial encounter    New Prescriptions New Prescriptions   No medications on file     [place], [place] 05/16/16 [place], [place] 05/16/16 0128    Ripley Fraise, MD 05/18/16 [phone]

## 2016-05-16 NOTE — Discharge Instructions (Signed)
Keep area clean and dry. Pat dry area. Please go to urgent care or back here in ED for suture removal and wound recheck in 5-7 days. You can also apply Neosporin as needed. Cold compress may also help relieve pain.  Contact your health care provider if: You have a fever. You have chills. You have drainage, redness, swelling, or pain at your wound. There is a bad smell coming from your wound. The skin edges of your wound start to separate after your sutures have been removed. Your wound becomes thick, raised, and darker in color after your sutures come out (scarring).

## 2016-05-30 ENCOUNTER — Encounter (HOSPITAL_COMMUNITY): Payer: Self-pay | Admitting: Emergency Medicine

## 2016-05-30 ENCOUNTER — Emergency Department (HOSPITAL_COMMUNITY)
Admission: EM | Admit: 2016-05-30 | Discharge: 2016-05-30 | Disposition: A | Discharge status: Home / Self Care | Payer: Medicare Other | Attending: Emergency Medicine | Admitting: Emergency Medicine

## 2016-05-30 DIAGNOSIS — Z87891 Personal history of nicotine dependence: Secondary | ICD-10-CM | POA: Diagnosis not present

## 2016-05-30 DIAGNOSIS — Z4802 Encounter for removal of sutures: Secondary | ICD-10-CM | POA: Insufficient documentation

## 2016-05-30 NOTE — ED Triage Notes (Signed)
Pt. Here for staple removal right side of head.

## 2016-05-30 NOTE — ED Provider Notes (Signed)
[name] DEPT Provider Note    By signing my name below, I, Bea Graff, attest that this documentation has been prepared under the direction and in the presence of Varney Biles, MD. Electronically Signed: Bea Graff, ED Scribe. 05/30/16. 5:12 PM.    History   Chief Complaint Chief Complaint  Patient presents with  . Suture / Staple Removal   The history is provided by the patient and medical records. No language interpreter was used.    Isaiah Black is a 41 y.o. male who presents to the Emergency Department needing three staples removed from the right parietal scalp that were placed 14 days ago. He states he was instructed to return in one week but he was busy and did not. There are no modifying factors. He denies fever, chills, drainage, bleeding or pain from the area.   History reviewed. No pertinent past medical history.  There are no active problems to display for this patient.   History reviewed. No pertinent surgical history.     Home Medications    Prior to Admission medications   Medication Sig Start Date End Date Taking? Authorizing Provider  azithromycin (ZITHROMAX Z-PAK) 250 MG tablet 2 po day one, then 1 daily x 4 days 02/13/14   [place], PA-C  guaiFENesin-codeine 100-10 MG/5ML syrup Take 5-10 mLs by mouth every 6 (six) hours as needed for cough. 04/04/14   Margarita Mail, PA-C  predniSONE (DELTASONE) 20 MG tablet Take 2 tablets (40 mg total) by mouth daily. 04/04/14   Margarita Mail, PA-C    Family History No family history on file.  Social History Social History  Substance Use Topics  . Smoking status: Former Research scientist (life sciences)  . Smokeless tobacco: Never Used     Comment: vape  . Alcohol use Yes     Comment: occ     Allergies   Patient has no known allergies.   Review of Systems Review of Systems  Constitutional: Negative for chills and fever.  Skin: Positive for wound.     Physical Exam Updated Vital Signs BP 118/77 (BP  Location: Left Arm)   Pulse 86   Temp 98.8 F (37.1 C) (Oral)   Resp 16   Ht 5\' 6"  (1.676 m)   Wt 120 lb (54.4 kg)   SpO2 98%   BMI 19.37 kg/m   Physical Exam  Constitutional: He is oriented to person, place, and time. He appears well-developed and well-nourished.  HENT:  Head: Normocephalic.  3 staples closing a 2 cm laceration of the right parietal scalp.  Eyes: EOM are normal.  Neck: Normal range of motion.  Pulmonary/Chest: Effort normal.  Abdominal: He exhibits no distension.  Musculoskeletal: Normal range of motion.  Neurological: He is alert and oriented to person, place, and time.  Psychiatric: He has a normal mood and affect.  Nursing note and vitals reviewed.    ED Treatments / Results  DIAGNOSTIC STUDIES: Oxygen Saturation is 98% on RA, normal by my interpretation.   COORDINATION OF CARE: 5:10 PM- Will remove sutures. Pt verbalizes understanding and agrees to plan.  Medications - No data to display  Labs (all labs ordered are listed, but only abnormal results are displayed) Labs Reviewed - No data to display  EKG  EKG Interpretation None       Radiology No results found.  Procedures .Suture Removal Date/Time: 05/30/2016 5:11 PM Performed by: Varney Biles Authorized by: Varney Biles   Consent:    Consent obtained:  Verbal   Consent given by:  Patient Location:    Location:  [place]   Head/neck location:  Scalp Procedure details:    Wound appearance:  No signs of infection   Number of staples removed:  3 Post-procedure details:    Patient tolerance of procedure:  Tolerated well, no immediate complications    (including critical care time)  Medications Ordered in ED Medications - No data to display   Initial Impression / Assessment and Plan / ED Course  I have reviewed the triage vital signs and the nursing notes.  Pertinent labs & imaging results that were available during my care of the patient were reviewed by me and  considered in my medical decision making (see chart for details).     Pt comes in with cc of suture removal. Wound has healed well.   Final Clinical Impressions(s) / ED Diagnoses   Final diagnoses:  Encounter for staple removal  Removal of staples    New Prescriptions New Prescriptions   No medications on file    I personally performed the services described in this documentation, which was scribed in my presence. The recorded information has been reviewed and is accurate.      Varney Biles, MD 05/30/16 1714

## 2016-06-04 ENCOUNTER — Encounter (HOSPITAL_COMMUNITY): Payer: Self-pay

## 2016-06-04 ENCOUNTER — Emergency Department (HOSPITAL_COMMUNITY)
Admission: EM | Admit: 2016-06-04 | Discharge: 2016-06-04 | Disposition: A | Discharge status: Home / Self Care | Payer: Medicare Other | Attending: Emergency Medicine | Admitting: Emergency Medicine

## 2016-06-04 DIAGNOSIS — S0502XA Injury of conjunctiva and corneal abrasion without foreign body, left eye, initial encounter: Secondary | ICD-10-CM | POA: Insufficient documentation

## 2016-06-04 DIAGNOSIS — Z87891 Personal history of nicotine dependence: Secondary | ICD-10-CM | POA: Insufficient documentation

## 2016-06-04 DIAGNOSIS — Y999 Unspecified external cause status: Secondary | ICD-10-CM | POA: Diagnosis not present

## 2016-06-04 DIAGNOSIS — Z79899 Other long term (current) drug therapy: Secondary | ICD-10-CM | POA: Insufficient documentation

## 2016-06-04 DIAGNOSIS — X58XXXA Exposure to other specified factors, initial encounter: Secondary | ICD-10-CM | POA: Diagnosis not present

## 2016-06-04 DIAGNOSIS — Y929 Unspecified place or not applicable: Secondary | ICD-10-CM | POA: Diagnosis not present

## 2016-06-04 DIAGNOSIS — Y939 Activity, unspecified: Secondary | ICD-10-CM | POA: Insufficient documentation

## 2016-06-04 MED ORDER — FLUORESCEIN SODIUM 0.6 MG OP STRP
1.0000 | ORAL_STRIP | Freq: Once | OPHTHALMIC | Status: AC
  Administered 2016-06-04: 1 via OPHTHALMIC
  Filled 2016-06-04: qty 1

## 2016-06-04 MED ORDER — TETRACAINE HCL 0.5 % OP SOLN
1.0000 [drp] | Freq: Once | OPHTHALMIC | Status: AC
  Administered 2016-06-04: 1 [drp] via OPHTHALMIC
  Filled 2016-06-04: qty 2

## 2016-06-04 MED ORDER — ERYTHROMYCIN 5 MG/GM OP OINT
1.0000 "application " | TOPICAL_OINTMENT | Freq: Once | OPHTHALMIC | Status: AC
  Administered 2016-06-04: 1 via OPHTHALMIC
  Filled 2016-06-04: qty 3.5

## 2016-06-04 NOTE — Discharge Instructions (Signed)
You have been diagnosed with a corneal abrasion to the left eye. Corneal abrasions can be painful, but generally heal within 3-4 days. Please take your antibiotics (erythromycin ointment) as follows: apply a 0.5 cm ribbon of ointment 4 times daily to the left eye for the next 4 days. If pain persists after 4 days, please call and make an appointment with Dr. Manuella Ghazi. If you develop new symptoms including visual changes, drainage, or redness or swelling to the eye, please return for re-evaluation. Please do not wear a patch over the eye.

## 2016-06-04 NOTE — ED Triage Notes (Signed)
Pt presents for evaluation of L eye pain. States was weed eating and something flew up into eye. Pt reports he was unable to find foreign body in eye but believes he may have scratched it. Denies visual changes or light sensitivity.

## 2016-06-04 NOTE — ED Provider Notes (Signed)
[facility] DEPT Provider Note   CSN: [id number] Arrival date & time: 06/04/16  1410   By signing my name below, I, Avnee Patel, attest that this documentation has been prepared under the direction and in the presence of  Ramani Riva, PA-C . Electronically Signed: [place], ED Scribe. 06/04/16. 5:21 PM.   History   Chief Complaint Chief Complaint  Patient presents with  . Eye Pain    HPI Comments:  Isaiah Black is a 41 y.o. male who presents to the Emergency Department complaining of acute onset, intermittent, sharp left eye pain beginning at 1 PM today. Pt states he was weed eating grass when something flew into his eye. His pain is worse when he looks up. No alleviating factors noted. Pt denies blurry vision, headaches or any other associated symptoms. Pt also denies eye glasses/contact use, any medication allergies and notes he is not followed by an ophthalmologist. No other complaints noted at this time.    The history is provided by the patient. No language interpreter was used.    History reviewed. No pertinent past medical history.  There are no active problems to display for this patient.   History reviewed. No pertinent surgical history.     Home Medications    Prior to Admission medications   Medication Sig Start Date End Date Taking? Authorizing Provider  azithromycin (ZITHROMAX Z-PAK) 250 MG tablet 2 po day one, then 1 daily x 4 days 02/13/14   [place], [place], PA-C  guaiFENesin-codeine 100-10 MG/5ML syrup Take 5-10 mLs by mouth every 6 (six) hours as needed for cough. 04/04/14   Harris, Vernie Shanks, PA-C  predniSONE (DELTASONE) 20 MG tablet Take 2 tablets (40 mg total) by mouth daily. 04/04/14   Margarita Mail, PA-C    Family History No family history on file.  Social History Social History  Substance Use Topics  . Smoking status: Former Research scientist (life sciences)  . Smokeless tobacco: Never Used     Comment: vape  . Alcohol use Yes     Comment: occ     Allergies     Patient has no known allergies.   Review of Systems Review of Systems  Constitutional: Negative for activity change.  Eyes: Positive for pain. Negative for photophobia, itching and visual disturbance.  Respiratory: Negative for shortness of breath.   Cardiovascular: Negative for chest pain.  Gastrointestinal: Negative for abdominal pain.  Musculoskeletal: Negative for back pain.  Skin: Negative for rash.  Allergic/Immunologic: Negative for immunocompromised state.  Neurological: Negative for headaches.   Physical Exam Updated Vital Signs BP 118/83 (BP Location: Right Arm)   Pulse 71   Temp 98.8 F (37.1 C) (Oral)   Resp 18   SpO2 100%   Physical Exam  Constitutional: He appears well-developed and well-nourished.  HENT:  Head: Normocephalic and atraumatic.  Eyes: EOM are normal. Pupils are equal, round, and reactive to light. Lids are everted and swept, no foreign bodies found. Right eye exhibits no discharge. No foreign body present in the right eye. Left eye exhibits no discharge. No foreign body present in the left eye. Right conjunctiva is not injected. Right conjunctiva has no hemorrhage. Left conjunctiva is injected (minimally injected). Left conjunctiva has no hemorrhage.  Slit lamp exam:      The left eye shows corneal abrasion and fluorescein uptake. The left eye shows no corneal flare, no corneal ulcer, no foreign body, no hyphema and no hypopyon.  Minimal injection to the left eye. Fluorescein uptake in the 2 o'clock upper,  outer quadrant. Approximately 0.5 cm in a linear fashion. Right eye with normal slit lamp exam. On left eye, no FB or acute abnormalities noted on slit lamp exam.   Neck: Neck supple.  Cardiovascular: Normal rate and regular rhythm.   No murmur heard. Pulmonary/Chest: Effort normal and breath sounds normal. No respiratory distress. He has no wheezes. He has no rales.  Abdominal: Soft. He exhibits no distension. There is no tenderness. There is no  guarding.  Musculoskeletal: He exhibits no edema.  Neurological: He is alert.  Skin: Skin is warm and dry.  Psychiatric: His behavior is normal.  Nursing note and vitals reviewed.    ED Treatments / Results  DIAGNOSTIC STUDIES:  Oxygen Saturation is 100% on RA, normal by my interpretation.    COORDINATION OF CARE:  5:20 PM Discussed treatment plan with pt at bedside and pt agreed to plan.  Labs (all labs ordered are listed, but only abnormal results are displayed) Labs Reviewed - No data to display  EKG  EKG Interpretation None       Radiology No results found.  Procedures Procedures (including critical care time)  Medications Ordered in ED Medications  fluorescein ophthalmic strip 1 strip (1 strip Left Eye Given by Other 06/04/16 1738)  tetracaine (PONTOCAINE) 0.5 % ophthalmic solution 1 drop (1 drop Left Eye Given by Other 06/04/16 1738)  erythromycin ophthalmic ointment 1 application (1 application Left Eye Given 06/04/16 1831)     Initial Impression / Assessment and Plan / ED Course  I have reviewed the triage vital signs and the nursing notes.  Pertinent labs & imaging results that were available during my care of the patient were reviewed by me and considered in my medical decision making (see chart for details).     Corneal abrasion  Pt with corneal abrasion on slit lamp exam. Eye irrigated w NS, no evidence of FB.  No change in vision, acuity equal bilaterally.  Pt is not a contact lens wearer.  Exam non-concerning for orbital cellulitis, hyphema, corneal ulcerations. Patient will be discharged home with erythromycin ointment.   Patient understands to follow up with ophthalmology, & to return to ER if new symptoms develop including change in vision, purulent drainage, or entrapment. VSS. NAD.   Final Clinical Impressions(s) / ED Diagnoses   Final diagnoses:  Abrasion of left cornea, initial encounter    New Prescriptions Discharge Medication List as of  06/04/2016  6:18 PM    I personally performed the services described in this documentation, which was scribed in my presence. The recorded information has been reviewed and is accurate.     Joline Maxcy A, PA-C 06/05/16 1810    Daleen Bo, MD 06/09/16 [phone]

## 2017-05-10 ENCOUNTER — Emergency Department (HOSPITAL_COMMUNITY)
Admission: EM | Admit: 2017-05-10 | Discharge: 2017-05-12 | Disposition: A | Discharge status: Home / Self Care | Payer: Medicare HMO | Attending: Emergency Medicine | Admitting: Emergency Medicine

## 2017-05-10 ENCOUNTER — Other Ambulatory Visit: Payer: Self-pay

## 2017-05-10 ENCOUNTER — Encounter (HOSPITAL_COMMUNITY): Payer: Self-pay

## 2017-05-10 ENCOUNTER — Emergency Department (HOSPITAL_COMMUNITY): Payer: Medicare HMO

## 2017-05-10 DIAGNOSIS — Z79899 Other long term (current) drug therapy: Secondary | ICD-10-CM | POA: Insufficient documentation

## 2017-05-10 DIAGNOSIS — R05 Cough: Secondary | ICD-10-CM | POA: Insufficient documentation

## 2017-05-10 DIAGNOSIS — R059 Cough, unspecified: Secondary | ICD-10-CM

## 2017-05-10 DIAGNOSIS — Z87891 Personal history of nicotine dependence: Secondary | ICD-10-CM | POA: Insufficient documentation

## 2017-05-10 NOTE — ED Triage Notes (Signed)
Pt presents with 2 month h/o productive cough and nasal congestion.  Pt taking OTC medication that initially helped is no longer working.

## 2017-05-11 MED ORDER — PSEUDOEPHEDRINE HCL ER 120 MG PO TB12: 120.0000 mg | ORAL_TABLET | Freq: Two times a day (BID) | ORAL | 0 refills | Status: DC

## 2017-05-11 MED ORDER — BENZONATATE 100 MG PO CAPS: 100.0000 mg | ORAL_CAPSULE | Freq: Three times a day (TID) | ORAL | 0 refills | Status: DC

## 2017-05-11 NOTE — Discharge Instructions (Signed)
Take the prescribed medication as directed. °Follow-up with your primary care doctor. °Return to the ED for new or worsening symptoms. °

## 2017-05-11 NOTE — ED Provider Notes (Signed)
[facility] Provider Note   CSN: [id number] Arrival date & time: 05/10/17  1523     History   Chief Complaint Chief Complaint  Patient presents with  . Cough    HPI [facility] is a 42 y.o. male.  The history is provided by the patient and medical records.  Cough      42 year old male presenting to the ED with cough for the past 2 months.  States did have some periods where he got better while taking Mucinex, he started taking it again about a week ago but has not had any change.  He reports some associated clear rhinorrhea and nasal congestion.  Denies any chest pain or shortness of breath, does feel like he pulled a muscle in his left chest from coughing, this is a little sore to the touch.  He has not had any fever or chills.  History reviewed. No pertinent past medical history.  There are no active problems to display for this patient.   History reviewed. No pertinent surgical history.      Home Medications    Prior to Admission medications   Medication Sig Start Date End Date Taking? Authorizing Provider  albuterol (PROVENTIL HFA;VENTOLIN HFA) 108 (90 Base) MCG/ACT inhaler Inhale 1-2 puffs into the lungs every 6 (six) hours as needed for wheezing or shortness of breath.   Yes [provider]  guaiFENesin (MUCINEX) 600 MG 12 hr tablet Take 600 mg by mouth 2 (two) times daily as needed for cough or to loosen phlegm.   Yes [provider]  azithromycin (ZITHROMAX Z-PAK) 250 MG tablet 2 po day one, then 1 daily x 4 days Patient not taking: Reported on 05/10/2017 02/13/14   Street, [place], PA-C  guaiFENesin-codeine 100-10 MG/5ML syrup Take 5-10 mLs by mouth every 6 (six) hours as needed for cough. Patient not taking: Reported on 05/10/2017 04/04/14   Margarita Mail, PA-C  predniSONE (DELTASONE) 20 MG tablet Take 2 tablets (40 mg total) by mouth daily. Patient not taking: Reported on 05/10/2017 04/04/14   Margarita Mail,  PA-C    Family History History reviewed. No pertinent family history.  Social History Social History   Tobacco Use  . Smoking status: Former Research scientist (life sciences)  . Smokeless tobacco: Never Used  . Tobacco comment: vape  Substance Use Topics  . Alcohol use: Yes    Comment: occ  . Drug use: No     Allergies   Patient has no known allergies.   Review of Systems Review of Systems  HENT: Positive for congestion.   Respiratory: Positive for cough.   All other systems reviewed and are negative.    Physical Exam Updated Vital Signs BP 118/90 (BP Location: Right Arm)   Pulse 87   Temp 98.3 F (36.8 C) (Oral)   Resp 16   Ht 5\' 6"  (1.676 m)   Wt 54.4 kg (120 lb)   SpO2 96%   BMI 19.37 kg/m   Physical Exam  Constitutional: He is oriented to person, place, and time. He appears well-developed and well-nourished.  HENT:  Head: Normocephalic and atraumatic.  Right Ear: Tympanic membrane and ear canal normal.  Left Ear: Tympanic membrane and ear canal normal.  Nose: Mucosal edema and rhinorrhea (clear) present.  Mouth/Throat: Uvula is midline, oropharynx is clear and moist and mucous membranes are normal.  Eyes: Pupils are equal, round, and reactive to light. Conjunctivae and EOM are normal.  Neck: Normal range of motion.  Cardiovascular: Normal  rate, regular rhythm and normal heart sounds.  Pulmonary/Chest: Effort normal and breath sounds normal. No stridor. No respiratory distress. He has no wheezes. He has no rhonchi.  Lungs are clear on exam, dry cough present  Abdominal: Soft. Bowel sounds are normal.  Musculoskeletal: Normal range of motion.  Neurological: He is alert and oriented to person, place, and time.  Skin: Skin is warm and dry.  Psychiatric: He has a normal mood and affect.  Nursing note and vitals reviewed.    ED Treatments / Results  Labs (all labs ordered are listed, but only abnormal results are displayed) Labs Reviewed - No data to  display  EKG None  Radiology Dg Chest 2 View  Result Date: 05/10/2017 CLINICAL DATA:  Cough EXAM: CHEST - 2 VIEW COMPARISON:  04/04/2014 FINDINGS: The heart size and mediastinal contours are within normal limits. Both lungs are clear. The visualized skeletal structures are unremarkable. IMPRESSION: No active cardiopulmonary disease. Electronically Signed   By: Donavan Foil M.D.   On: 05/10/2017 16:38    Procedures Procedures (including critical care time)  Medications Ordered in ED Medications - No data to display   Initial Impression / Assessment and Plan / ED Course  I have reviewed the triage vital signs and the nursing notes.  Pertinent labs & imaging results that were available during my care of the patient were reviewed by me and considered in my medical decision making (see chart for details).  42 year old male here with 2 months of cough.  This is dry on exam here.  He is afebrile and nontoxic.  Lungs are clear without any wheezes or rhonchi.  He is in no acute distress.  Does have some nasal congestion and clear rhinorrhea.  Chest x-ray was obtained from triage and is negative.  He is not on any ACEI that would account for ongoing cough.  Suspect may be viral process.  Can continue OTC decongestants, tessalon for cough.  Follow-up with PCP.  Discussed plan with patient, he acknowledged understanding and agreed with plan of care.  Return precautions given for new or worsening symptoms.  Final Clinical Impressions(s) / ED Diagnoses   Final diagnoses:  Cough    ED Discharge Orders        Ordered    benzonatate (TESSALON) 100 MG capsule  Every 8 hours     05/11/17 0031    pseudoephedrine (SUDAFED 12 HOUR) 120 MG 12 hr tablet  2 times daily     05/11/17 0031       Larene Pickett, PA-C 05/11/17 0430    Orpah Greek, MD 05/11/17 [phone]

## 2017-07-05 ENCOUNTER — Other Ambulatory Visit: Payer: Self-pay

## 2017-07-05 ENCOUNTER — Emergency Department (HOSPITAL_COMMUNITY)
Admission: EM | Admit: 2017-07-05 | Discharge: 2017-07-06 | Disposition: A | Discharge status: Other Acute Inpatient Hospital | Payer: Medicare Other | Attending: Emergency Medicine | Admitting: Emergency Medicine

## 2017-07-05 ENCOUNTER — Encounter (HOSPITAL_COMMUNITY): Payer: Self-pay | Admitting: *Deleted

## 2017-07-05 ENCOUNTER — Ambulatory Visit (HOSPITAL_COMMUNITY)
Admission: RE | Admit: 2017-07-05 | Discharge: 2017-07-05 | Disposition: A | Discharge status: Home / Self Care | Payer: Medicare Other | Source: Home / Self Care | Attending: Psychiatry | Admitting: Psychiatry

## 2017-07-05 DIAGNOSIS — F332 Major depressive disorder, recurrent severe without psychotic features: Secondary | ICD-10-CM | POA: Diagnosis not present

## 2017-07-05 DIAGNOSIS — F99 Mental disorder, not otherwise specified: Secondary | ICD-10-CM | POA: Diagnosis present

## 2017-07-05 DIAGNOSIS — F419 Anxiety disorder, unspecified: Secondary | ICD-10-CM | POA: Insufficient documentation

## 2017-07-05 DIAGNOSIS — F200 Paranoid schizophrenia: Secondary | ICD-10-CM | POA: Diagnosis not present

## 2017-07-05 DIAGNOSIS — Z653 Problems related to other legal circumstances: Secondary | ICD-10-CM | POA: Diagnosis not present

## 2017-07-05 DIAGNOSIS — R45851 Suicidal ideations: Secondary | ICD-10-CM | POA: Insufficient documentation

## 2017-07-05 DIAGNOSIS — Z7289 Other problems related to lifestyle: Secondary | ICD-10-CM | POA: Diagnosis not present

## 2017-07-05 DIAGNOSIS — Z87891 Personal history of nicotine dependence: Secondary | ICD-10-CM | POA: Diagnosis not present

## 2017-07-05 DIAGNOSIS — F209 Schizophrenia, unspecified: Secondary | ICD-10-CM | POA: Diagnosis present

## 2017-07-05 HISTORY — DX: Dissociative and conversion disorder, unspecified: F44.9

## 2017-07-05 HISTORY — DX: Paranoid schizophrenia: F20.0

## 2017-07-05 HISTORY — DX: Anxiety disorder, unspecified: F41.9
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
## 2017-07-05 NOTE — H&P (Signed)
Behavioral Health Medical Screening Exam  [facility] is an 42 y.o. male.  Total Time spent with patient: 20 minutes  Psychiatric Specialty Exam: Physical Exam  Constitutional: He is oriented to person, place, and time. He appears well-developed and well-nourished. No distress.  HENT:  Head: Normocephalic and atraumatic.  Right Ear: External ear normal.  Left Ear: External ear normal.  Eyes: Conjunctivae are normal.  Respiratory: Effort normal. No respiratory distress.  Musculoskeletal: Normal range of motion.  Neurological: He is alert and oriented to person, place, and time.  Skin: Skin is warm and dry. He is not diaphoretic.  Psychiatric: His mood appears anxious. He is actively hallucinating. Cognition and memory are normal. He expresses impulsivity and inappropriate judgment. He exhibits a depressed mood. He expresses suicidal ideation. He expresses suicidal plans.    Review of Systems  Constitutional: Negative for chills, fever and weight loss.  Psychiatric/Behavioral: Positive for depression, hallucinations and suicidal ideas. Negative for memory loss. The patient is nervous/anxious and has insomnia.   All other systems reviewed and are negative.   Blood pressure (!) 127/91, pulse (!) 107, temperature 98.6 F (37 C), resp. rate 18.There is no height or weight on file to calculate BMI.  General Appearance: Casual and Fairly Groomed  Eye Contact:  Fair  Speech:  Clear and Coherent and Normal Rate  Volume:  Normal  Mood:  Anxious and Depressed  Affect:  Congruent and Depressed  Thought Process:  Coherent, Goal Directed and Descriptions of Associations: Intact  Orientation:  Full (Time, Place, and Person)  Thought Content:  Logical and Hallucinations: Auditory  Suicidal Thoughts:  Yes.  with intent/plan  Homicidal Thoughts:  No  Memory:  Immediate;   Good Recent;   Good  Judgement:  Impaired  Insight:  Fair  Psychomotor Activity:  Normal  Concentration: Concentration:  Fair and Attention Span: Fair  Recall:  Good  Fund of Knowledge:Good  Language: Good  Akathisia:  No  Handed:  Right  AIMS (if indicated):     Assets:  Communication Skills Desire for Improvement Financial Resources/Insurance Housing Intimacy Leisure Time Physical Health  Sleep:       Musculoskeletal: Strength & Muscle Tone: within normal limits Gait & Station: normal   Blood pressure (!) 127/91, pulse (!) 107, temperature 98.6 F (37 C), resp. rate 18.  Recommendations:  Based on my evaluation the patient does not appear to have an emergency medical condition.  Rozetta Nunnery, NP 07/05/2017, 10:47 PM
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
## 2017-07-06 ENCOUNTER — Inpatient Hospital Stay (HOSPITAL_COMMUNITY)
Admission: AD | Admit: 2017-07-06 | Discharge: 2017-07-12 | DRG: 885 | Disposition: A | Discharge status: Home / Self Care | Payer: Medicare Other | Source: Intra-hospital | Attending: Psychiatry | Admitting: Psychiatry

## 2017-07-06 ENCOUNTER — Other Ambulatory Visit: Payer: Self-pay

## 2017-07-06 ENCOUNTER — Encounter (HOSPITAL_COMMUNITY): Payer: Self-pay | Admitting: *Deleted

## 2017-07-06 DIAGNOSIS — Z653 Problems related to other legal circumstances: Secondary | ICD-10-CM | POA: Diagnosis not present

## 2017-07-06 DIAGNOSIS — Z91410 Personal history of adult physical and sexual abuse: Secondary | ICD-10-CM

## 2017-07-06 DIAGNOSIS — F329 Major depressive disorder, single episode, unspecified: Secondary | ICD-10-CM | POA: Diagnosis not present

## 2017-07-06 DIAGNOSIS — R45851 Suicidal ideations: Secondary | ICD-10-CM

## 2017-07-06 DIAGNOSIS — F1099 Alcohol use, unspecified with unspecified alcohol-induced disorder: Secondary | ICD-10-CM | POA: Diagnosis not present

## 2017-07-06 DIAGNOSIS — F17210 Nicotine dependence, cigarettes, uncomplicated: Secondary | ICD-10-CM | POA: Diagnosis not present

## 2017-07-06 DIAGNOSIS — F209 Schizophrenia, unspecified: Secondary | ICD-10-CM | POA: Diagnosis present

## 2017-07-06 DIAGNOSIS — Z87891 Personal history of nicotine dependence: Secondary | ICD-10-CM | POA: Diagnosis not present

## 2017-07-06 DIAGNOSIS — R450 Nervousness: Secondary | ICD-10-CM | POA: Diagnosis not present

## 2017-07-06 DIAGNOSIS — F419 Anxiety disorder, unspecified: Secondary | ICD-10-CM | POA: Diagnosis present

## 2017-07-06 DIAGNOSIS — F332 Major depressive disorder, recurrent severe without psychotic features: Secondary | ICD-10-CM

## 2017-07-06 DIAGNOSIS — Z7289 Other problems related to lifestyle: Secondary | ICD-10-CM

## 2017-07-06 DIAGNOSIS — Z62810 Personal history of physical and sexual abuse in childhood: Secondary | ICD-10-CM | POA: Diagnosis not present

## 2017-07-06 DIAGNOSIS — Z818 Family history of other mental and behavioral disorders: Secondary | ICD-10-CM

## 2017-07-06 DIAGNOSIS — G4700 Insomnia, unspecified: Secondary | ICD-10-CM | POA: Diagnosis present

## 2017-07-06 LAB — SALICYLATE LEVEL: Salicylate Lvl: <7 mg/dL (ref 2.8–30.0)

## 2017-07-06 LAB — RAPID URINE DRUG SCREEN, HOSP PERFORMED
Amphetamines: NOT DETECTED
BENZODIAZEPINES: NOT DETECTED
Barbiturates: NOT DETECTED
COCAINE: NOT DETECTED
Opiates: NOT DETECTED
Tetrahydrocannabinol: NOT DETECTED

## 2017-07-06 LAB — CBC
HCT: 45.9 % (ref 39.0–52.0)
Hemoglobin: 16 g/dL (ref 13.0–17.0)
MCH: 31.3 pg (ref 26.0–34.0)
MCHC: 34.9 g/dL (ref 30.0–36.0)
MCV: 89.6 fL (ref 78.0–100.0)
PLATELETS: 251 10*3/uL (ref 150–400)
RBC: 5.12 MIL/uL (ref 4.22–5.81)
RDW: 12 % (ref 11.5–15.5)
WBC: 12.8 10*3/uL — AB (ref 4.0–10.5)

## 2017-07-06 LAB — COMPREHENSIVE METABOLIC PANEL
ALBUMIN: 5 g/dL (ref 3.5–5.0)
ALK PHOS: 53 U/L (ref 38–126)
ALT: 23 U/L (ref 17–63)
ANION GAP: 9 (ref 5–15)
AST: 20 U/L (ref 15–41)
BUN: 23 mg/dL — ABNORMAL HIGH (ref 6–20)
CHLORIDE: 104 mmol/L (ref 101–111)
CO2: 27 mmol/L (ref 22–32)
CREATININE: 0.85 mg/dL (ref 0.61–1.24)
Calcium: 9.2 mg/dL (ref 8.9–10.3)
GFR calc Af Amer: >60 mL/min (ref >60)
GFR calc non Af Amer: >60 mL/min (ref >60)
GLUCOSE: 105 mg/dL — AB (ref 65–99)
Potassium: 4.3 mmol/L (ref 3.5–5.1)
SODIUM: 140 mmol/L (ref 135–145)
Total Bilirubin: 1.4 mg/dL — ABNORMAL HIGH (ref 0.3–1.2)
Total Protein: 7.6 g/dL (ref 6.5–8.1)

## 2017-07-06 LAB — ACETAMINOPHEN LEVEL

## 2017-07-06 LAB — ETHANOL

## 2017-07-06 MED ORDER — ALUM & MAG HYDROXIDE-SIMETH 200-200-20 MG/5ML PO SUSP: 30.0000 mL | ORAL | Status: DC | PRN

## 2017-07-06 MED ORDER — ACETAMINOPHEN 325 MG PO TABS: 650.0000 mg | ORAL_TABLET | Freq: Four times a day (QID) | ORAL | Status: DC | PRN

## 2017-07-06 MED ORDER — FLUOXETINE HCL 20 MG PO CAPS
20.0000 mg | ORAL_CAPSULE | Freq: Every day | ORAL | Status: DC
  Administered 2017-07-07 – 2017-07-12 (×6): 20 mg via ORAL
  Filled 2017-07-06 (×2): qty 1
  Filled 2017-07-06: qty 7
  Filled 2017-07-06 (×5): qty 1

## 2017-07-06 MED ORDER — FLUOXETINE HCL 20 MG PO CAPS
20.0000 mg | ORAL_CAPSULE | Freq: Every day | ORAL | Status: DC
  Administered 2017-07-06: 20 mg via ORAL
  Filled 2017-07-06: qty 1

## 2017-07-06 MED ORDER — MAGNESIUM HYDROXIDE 400 MG/5ML PO SUSP: 30.0000 mL | Freq: Every day | ORAL | Status: DC | PRN

## 2017-07-06 MED ORDER — OLANZAPINE 2.5 MG PO TABS
2.5000 mg | ORAL_TABLET | Freq: Two times a day (BID) | ORAL | Status: DC
  Administered 2017-07-06: 2.5 mg via ORAL
  Filled 2017-07-06: qty 1

## 2017-07-06 MED ORDER — OLANZAPINE 2.5 MG PO TABS
2.5000 mg | ORAL_TABLET | Freq: Two times a day (BID) | ORAL | Status: DC
  Administered 2017-07-06 – 2017-07-07 (×2): 2.5 mg via ORAL
  Filled 2017-07-06 (×4): qty 1

## 2017-07-06 NOTE — ED Notes (Signed)
Bed: OER84 Expected date:  Expected time:  Means of arrival:  Comments: 30

## 2017-07-06 NOTE — Progress Notes (Signed)
Pt transferred to the 500 hall per Dr. Mallie Darting request. Writer reviewed meds with pt and administered Zyprexa to pt as scheduled. Pt endorses AH. Pt appears limited and childlike during the assessment. Pt c/o feeling nauseated and tired on approach. Pt offered ginger ale and an antiemetic med. Pt declined antiemetic med. Pt requested to rest. Writer informed [facility] on the hall that the pt will not be attending supper in the cafeteria and to bring back pt's supper to the unit.

## 2017-07-06 NOTE — Progress Notes (Signed)
Pt in room until group session.  Pt took long time to answer if he was SI.  Pt sts he is still thinking about it but has no plan.  Pt contract for safety.  Pt sts he still has a sour stomach and is not eating well. Pt encouraged to keep his fluids up and is offered some crackers to eat. Pt returns to room and is in bed.

## 2017-07-06 NOTE — Progress Notes (Signed)
Pt reports ongoing chatter in his head and is unable to determine if someone is talking to him.

## 2017-07-06 NOTE — Progress Notes (Signed)
Adult Psychoeducational Group Note  Date:  07/06/2017 Time:  8:57 PM  Group Topic/Focus:  Wrap-Up Group:   The focus of this group is to help patients review their daily goal of treatment and discuss progress on daily workbooks.  Participation Level:  Active  Participation Quality:  Appropriate  Affect:  Flat  Cognitive:  Oriented  Insight: Limited  Engagement in Group:  Engaged  Modes of Intervention:  Socialization and Support  Additional Comments:  Patient attended and participated in group tonight. He reports having some sleep today. He did not eat anything today due to having an upset stomach.  Salley [facility] 07/06/2017, 8:57 PM

## 2017-07-06 NOTE — BH Assessment (Signed)
Patient evaluated by Dr. Mariea Clonts and Waylan Boga, DN. Patient meets criteria for inpatient treatment at [facility]. Patient assigned to bed 401-2 after 12:30pm today. Support paperwork completed and faxed to [facility]. Patient is voluntary and pending [facility] transport.

## 2017-07-06 NOTE — ED Provider Notes (Signed)
[facility] DEPT Provider Note  CSN: [id number] Arrival date & time: 07/05/17 2252  Chief Complaint(s) Suicidal  HPI Isaiah Black is a 42 y.o. male who presents to the emergency department after being evaluated at behavioral health for suicidal ideations.  Please see their note for full details.  They recommended inpatient management but do not have beds available.  Sent here to await admission.  Patient currently denies any physical complaints including headache, chest pain, shortness of breath, nausea, vomiting, abdominal pain.    HPI  Past Medical History Past Medical History:  Diagnosis Date  . Anxiety   . Disassociation disorder   . Paranoid schizophrenia ([place])    There are no active problems to display for this patient.  Home Medication(s) Prior to Admission medications   Medication Sig Start Date End Date Taking? Authorizing Provider  risperiDONE (RISPERDAL) 2 MG tablet Take 2 mg by mouth as needed (agitation).   Yes [provider]  azithromycin (ZITHROMAX Z-PAK) 250 MG tablet 2 po day one, then 1 daily x 4 days Patient not taking: Reported on 05/10/2017 02/13/14   Street, [place], PA-C  benzonatate (TESSALON) 100 MG capsule Take 1 capsule (100 mg total) by mouth every 8 (eight) hours. Patient not taking: Reported on 07/05/2017 05/11/17   Larene Pickett, PA-C  guaiFENesin-codeine 100-10 MG/5ML syrup Take 5-10 mLs by mouth every 6 (six) hours as needed for cough. Patient not taking: Reported on 05/10/2017 04/04/14   Margarita Mail, PA-C  predniSONE (DELTASONE) 20 MG tablet Take 2 tablets (40 mg total) by mouth daily. Patient not taking: Reported on 05/10/2017 04/04/14   Margarita Mail, PA-C  pseudoephedrine (SUDAFED 12 HOUR) 120 MG 12 hr tablet Take 1 tablet (120 mg total) by mouth 2 (two) times daily. Patient not taking: Reported on 07/05/2017 05/11/17   Kathryne Hitch                                                             Past Surgical History History reviewed. No pertinent surgical history. Family History No family history on file.  Social History Social History   Tobacco Use  . Smoking status: Former Research scientist (life sciences)  . Smokeless tobacco: Never Used  . Tobacco comment: vape  Substance Use Topics  . Alcohol use: Yes    Comment: occ  . Drug use: Yes    Types: Marijuana    Comment: Pt stated "I was vaping the pens"   Allergies Patient has no known allergies.  Review of Systems Review of Systems All other systems are reviewed and are negative for acute change except as noted in the HPI  Physical Exam Vital Signs  I have reviewed the triage vital signs BP (!) 142/98 (BP Location: Right Arm)   Pulse 95   Temp 98.5 F (36.9 C) (Oral)   Resp 18   Ht 5\' 5"  (1.651 m)   Wt 53.5 kg (118 lb)   SpO2 98%   BMI 19.64 kg/m   Physical Exam  Constitutional: He is oriented to person, place, and time. He appears well-developed and well-nourished. No distress.  HENT:  Head: Normocephalic and atraumatic.  Right Ear: External ear normal.  Left Ear: External ear normal.  Nose: Nose normal.  Mouth/Throat: Mucous membranes are normal. No trismus in the  jaw.  Eyes: Conjunctivae and EOM are normal. No scleral icterus.  Neck: Normal range of motion and phonation normal.  Cardiovascular: Normal rate and regular rhythm.  Pulmonary/Chest: Effort normal. No stridor. No respiratory distress.  Abdominal: He exhibits no distension.  Musculoskeletal: Normal range of motion. He exhibits no edema.  Neurological: He is alert and oriented to person, place, and time.  Skin: He is not diaphoretic.  Psychiatric: He has a normal mood and affect. His behavior is normal.  Vitals reviewed.   ED Results and Treatments Labs (all labs ordered are listed, but only abnormal results are displayed) Labs Reviewed  COMPREHENSIVE METABOLIC PANEL - Abnormal; Notable for the  following components:      Result Value   Glucose, Bld 105 (*)    BUN 23 (*)    Total Bilirubin 1.4 (*)    All other components within normal limits  ACETAMINOPHEN LEVEL - Abnormal; Notable for the following components:   Acetaminophen (Tylenol), Serum <10 (*)    All other components within normal limits  CBC - Abnormal; Notable for the following components:   WBC 12.8 (*)    All other components within normal limits  ETHANOL  SALICYLATE LEVEL  RAPID URINE DRUG SCREEN, HOSP PERFORMED                                                                                                                         EKG  EKG Interpretation  Date/Time:    Ventricular Rate:    PR Interval:    QRS Duration:   QT Interval:    QTC Calculation:   R Axis:     Text Interpretation:        Radiology No results found. Pertinent labs & imaging results that were available during my care of the patient were reviewed by me and considered in my medical decision making (see chart for details).  Medications Ordered in ED Medications  acetaminophen (TYLENOL) tablet 650 mg (has no administration in time range)                                                                                                                                    Procedures Procedures  (including critical care time)  Medical Decision Making / ED Course I have reviewed the nursing notes for this encounter and the patient's prior records (if available in EHR or on provided paperwork).  Medical screening labs obtain and grossly reassuring.  Patient is medically cleared.  Will await bed placement.  Final Clinical Impression(s) / ED Diagnoses Final diagnoses:  Suicidal ideation      This chart was dictated using voice recognition software.  Despite best efforts to proofread,  errors can occur which can change the documentation meaning.   Fatima Blank, MD 07/06/17 [phone]

## 2017-07-06 NOTE — Tx Team (Signed)
Initial Treatment Plan 07/06/2017 2:12 PM Isaiah Black HUD:[id number]    PATIENT STRESSORS: Marital or family conflict Medication change or noncompliance   PATIENT STRENGTHS: Ability for insight Average or above average intelligence Capable of independent living General fund of knowledge Motivation for treatment/growth   PATIENT IDENTIFIED PROBLEMS: Depression Suicidal thoughts Auditory hallucinations "Would like to get back on medications"                     DISCHARGE CRITERIA:  Ability to meet basic life and health needs Improved stabilization in mood, thinking, and/or behavior Reduction of life-threatening or endangering symptoms to within safe limits Verbal commitment to aftercare and medication compliance  PRELIMINARY DISCHARGE PLAN: Attend aftercare/continuing care group Return to previous living arrangement  PATIENT/FAMILY INVOLVEMENT: This treatment plan has been presented to and reviewed with the patient, Isaiah Black, and/or family member, .  The patient and family have been given the opportunity to ask questions and make suggestions.  [place], [place], [place] 07/06/2017, 2:12 PM
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
## 2017-07-06 NOTE — ED Notes (Signed)
[facility] transport on unit to transfer pt to [facility] Adult unit per MD order. Personal property given to transport for transfer. Pt ambulatory off unit.

## 2017-07-06 NOTE — Progress Notes (Signed)
Isaiah Black is a 42 year old male pt admitted on voluntary basis. On admission, he endorses depression and passive SI but is able to contract for safety on the unit. He vaguely spoke about some family issues and spoke more about how he was diagnosed as schizophrenic in 2011 and spoke about how he was seeing Dr. Loni Muse for medication management and spoke about being on risperdal in the past. He spoke about how he could not afford to keep going due to a $40 dollar co-pay. He does endorse auditory hallucinations and reports it sounds like "chatter in my head". He reports that he would like to get back on medications while he is here. He reports that he has his own place and lives alone and reports that he will go back there after discharge. Isaiah Black was oriented to the unit and safety maintained.

## 2017-07-07 DIAGNOSIS — Z62810 Personal history of physical and sexual abuse in childhood: Secondary | ICD-10-CM

## 2017-07-07 DIAGNOSIS — F419 Anxiety disorder, unspecified: Secondary | ICD-10-CM

## 2017-07-07 DIAGNOSIS — Z653 Problems related to other legal circumstances: Secondary | ICD-10-CM

## 2017-07-07 DIAGNOSIS — F209 Schizophrenia, unspecified: Principal | ICD-10-CM

## 2017-07-07 DIAGNOSIS — F329 Major depressive disorder, single episode, unspecified: Secondary | ICD-10-CM

## 2017-07-07 DIAGNOSIS — R450 Nervousness: Secondary | ICD-10-CM

## 2017-07-07 DIAGNOSIS — G4700 Insomnia, unspecified: Secondary | ICD-10-CM

## 2017-07-07 DIAGNOSIS — F1099 Alcohol use, unspecified with unspecified alcohol-induced disorder: Secondary | ICD-10-CM

## 2017-07-07 DIAGNOSIS — F17210 Nicotine dependence, cigarettes, uncomplicated: Secondary | ICD-10-CM

## 2017-07-07 DIAGNOSIS — R45851 Suicidal ideations: Secondary | ICD-10-CM

## 2017-07-07 DIAGNOSIS — Z818 Family history of other mental and behavioral disorders: Secondary | ICD-10-CM

## 2017-07-07 MED ORDER — TRAZODONE HCL 50 MG PO TABS
50.0000 mg | ORAL_TABLET | Freq: Every evening | ORAL | Status: DC | PRN
  Administered 2017-07-09: 50 mg via ORAL
  Filled 2017-07-07: qty 1

## 2017-07-07 MED ORDER — PALIPERIDONE ER 6 MG PO TB24
6.0000 mg | ORAL_TABLET | Freq: Every day | ORAL | Status: DC
  Administered 2017-07-07 – 2017-07-12 (×6): 6 mg via ORAL
  Filled 2017-07-07 (×5): qty 1
  Filled 2017-07-07: qty 7
  Filled 2017-07-07 (×3): qty 1

## 2017-07-07 MED ORDER — HYDROXYZINE HCL 50 MG PO TABS
50.0000 mg | ORAL_TABLET | Freq: Four times a day (QID) | ORAL | Status: DC | PRN
Start: 2017-07-07 — End: 2017-07-12
  Administered 2017-07-09 – 2017-07-11 (×2): 50 mg via ORAL
  Filled 2017-07-07: qty 1
  Filled 2017-07-07: qty 10
  Filled 2017-07-07 (×2): qty 1

## 2017-07-07 MED ORDER — PALIPERIDONE PALMITATE ER 234 MG/1.5ML IM SUSY
234.0000 mg | PREFILLED_SYRINGE | Freq: Once | INTRAMUSCULAR | Status: AC
  Administered 2017-07-07: 234 mg via INTRAMUSCULAR
  Filled 2017-07-07: qty 1.5

## 2017-07-07 MED ORDER — PALIPERIDONE PALMITATE ER 156 MG/ML IM SUSY
156.0000 mg | PREFILLED_SYRINGE | INTRAMUSCULAR | Status: DC
  Administered 2017-07-11: 156 mg via INTRAMUSCULAR
  Filled 2017-07-07: qty 1

## 2017-07-07 NOTE — [facility] Group Notes (Signed)
[facility] Notes:  (Nursing/MHT/Case Management/Adjunct)  Date:  07/07/2017  Time:  5:08 PM  Type of Therapy:  Psychoeducational Skills  Participation Level:  Did Not Attend      Isaiah Black 07/07/2017, 5:08 PM
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
## 2017-07-07 NOTE — Progress Notes (Signed)
Recreation Therapy Notes  Date: 6.6.19 Time: 1000 Location: 500 Hall Dayroom  Group Topic: Communication, Team Building, Problem Solving  Goal Area(s) Addresses:  Patient will effectively work with peer towards shared goal.  Patient will identify skill used to make activity successful.  Patient will identify how skills used during activity can be used to reach post d/c goals.   Behavioral Response: Engaged  Intervention: STEM Activity   Activity: Metallurgist. In teams, patients were asked to build the tallest freestanding tower possible out of 15 pipe cleaners. Systematically resources were removed, for example patient ability to use both hands and patient ability to verbally communicate.    Education: Education officer, community, Dentist.   Education Outcome: Acknowledges education/In group clarification offered/Needs additional education.   Clinical Observations/Feedback: Pt worked well with peer.  Pt took on more of the leader role in the group.  Pt was smiling at times.  Pt was also patient with his peer.  Pt stated they used teamwork and communication to complete the activity.      Victorino Sparrow, LRT/CTRS     Ria Comment, Annastyn Silvey A 07/07/2017 11:14 AM

## 2017-07-07 NOTE — Plan of Care (Signed)
  Problem: Safety: Goal: Periods of time without injury will increase Outcome: Progressing   Problem: Medication: Goal: Compliance with prescribed medication regimen will improve Outcome: Progressing   Problem: Health Behavior/Discharge Planning: Goal: Compliance with prescribed medication regimen will improve Outcome: Progressing   Problem: Safety: Goal: Ability to remain free from injury will improve Outcome: Progressing  DAR NOTE: Patient presents with anxious affect and mood.  States suicidal thoughts comes and go with no plan.  Reports hearing chattering noises and sees bright lights.  Rates depression at 8, hopelessness at 9, and anxiety at 8.  Maintained on routine safety checks.  Medications given as prescribed.  Support and encouragement offered as needed.  Attended group and participated.  States goal for today is "to feel normal."  Patient visible in milieu with minimal interaction with staff and peers.  Received Kirt Boys with no adverse reaction.  Patient is safe on the unit.

## 2017-07-07 NOTE — Progress Notes (Signed)
Pt in dayroom interacting with other patient.  Pt has cold pack to L upper are d/t injection.  Pt rates pain a 3.  Pt denies SI, HI and AVH.  Pt contracts for safety.  Pt attends group, eats snack and watches TV. Pt denies any needs at this time. Pt remains safe on unit.
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
## 2017-07-07 NOTE — BHH Group Notes (Signed)
[facility] Group Therapy  07/07/2017 , 1:20 PM    Type of Therapy:  Mental Health Association Presentation  Participation Level:  Active  Participation Quality:  Attentive  Affect:  Blunted  Cognitive:  Oriented  Insight:  Limited  Engagement in Therapy:  Engaged  Modes of Intervention:  Discussion, Education and Socialization  Summary of Progress/Problems:  Tammi  from [place] came to present her recovery story, encourage group  members to share something about their story, and present information about the MHA.   Shared his coping skills at the end of group. Roque Lias B 07/07/2017 , 1:20 PM

## 2017-07-07 NOTE — [facility] Suicide Risk Assessment (Addendum)
[facility] INPATIENT:  Family/Significant Other Suicide Prevention Education  Suicide Prevention Education:  Education Completed; Mrs Isaiah Black, mother [phone]  has been identified by the patient as the family member/significant other with whom the patient will be residing, and identified as the person(s) who will aid the patient in the event of a mental health crisis (suicidal ideations/suicide attempt).  With written consent from the patient, the family member/significant other has been provided the following suicide prevention education, prior to the and/or following the discharge of the patient.  The suicide prevention education provided includes the following:  Suicide risk factors  Suicide prevention and interventions  National Suicide Hotline telephone number  [facility] assessment telephone number  [facility] Emergency Assistance [place] and/or Residential Mobile Crisis Unit telephone number  Request made of family/significant other to:  Remove weapons (e.g., guns, rifles, knives), all items previously/currently identified as safety concern.    Remove drugs/medications (over-the-counter, prescriptions, illicit drugs), all items previously/currently identified as a safety concern.  The family member/significant other verbalizes understanding of the suicide prevention education information provided.  The family member/significant other agrees to remove the items of safety concern listed above. "He sits in his house all day surrounded by his daughter's belongings and getting more and more depressed.  I'm concerned about his safety. He has no guns."   Isaiah Black 07/07/2017, 3:50 PM

## 2017-07-07 NOTE — Progress Notes (Signed)
Adult Psychoeducational Group Note  Date:  07/07/2017 Time:  8:29 PM  Group Topic/Focus:  Wrap-Up Group:   The focus of this group is to help patients review their daily goal of treatment and discuss progress on daily workbooks.  Participation Level:  Active  Participation Quality:  Appropriate  Affect:  Appropriate  Cognitive:  Alert  Insight: Appropriate  Engagement in Group:  Engaged  Modes of Intervention:  Discussion  Additional Comments:  Pt stated today was a good day. Pt stated that he was able to talk with family and got his meds adjusted. He found the second group today to be very helpful and reliable.   Wynelle Fanny R 07/07/2017, 8:29 PM

## 2017-07-08 DIAGNOSIS — Z87891 Personal history of nicotine dependence: Secondary | ICD-10-CM

## 2017-07-08 LAB — LIPID PANEL
Cholesterol: 176 mg/dL (ref 0–200)
HDL: 58 mg/dL (ref >40)
LDL Cholesterol: 109 mg/dL — ABNORMAL HIGH (ref 0–99)
Total CHOL/HDL Ratio: 3 RATIO
Triglycerides: 44 mg/dL (ref <150)
VLDL: 9 mg/dL (ref 0–40)

## 2017-07-08 LAB — TSH: TSH: 6.137 u[IU]/mL — AB (ref 0.350–4.500)

## 2017-07-08 LAB — HEMOGLOBIN A1C
HEMOGLOBIN A1C: 5.1 % (ref 4.8–5.6)
MEAN PLASMA GLUCOSE: 99.67 mg/dL
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
## 2017-07-08 NOTE — [facility] Group Notes (Signed)
[facility] LCSW Group Therapy  07/08/2017  1:05 PM  Type of Therapy:  Group therapy  Participation Level:  Active  Participation Quality:  Attentive  Affect:  Flat  Cognitive:  Oriented  Insight:  Limited  Engagement in Therapy:  Limited  Modes of Intervention:  Discussion, Socialization  Summary of Progress/Problems:  Chaplain was here to lead a group on themes of hope and courage. "Hope is holdin' on.  And I'm still here." Minimal interaction, contributions.  Declined to comment further about "complicated relationships with females."  Trish Mage 07/08/2017 1:31 PM

## 2017-07-08 NOTE — Tx Team (Signed)
Interdisciplinary Treatment and Diagnostic Plan Update  07/08/2017 Time of Session: 10:10 AM  Isaiah Black MRN: [id number]  Principal Diagnosis: Schizophrenia [facility])  Secondary Diagnoses: Principal Problem:   Schizophrenia ([place])   Current Medications:  Current Facility-Administered Medications  Medication Dose Route Frequency Provider Last Rate Last Dose  . alum & mag hydroxide-simeth (MAALOX/MYLANTA) 200-200-20 MG/5ML suspension 30 mL  30 mL Oral Q4H PRN Patrecia Pour, NP      . FLUoxetine (PROZAC) capsule 20 mg  20 mg Oral Daily Patrecia Pour, NP   20 mg at 07/08/17 0758  . hydrOXYzine (ATARAX/VISTARIL) tablet 50 mg  50 mg Oral Q6H PRN Pennelope Bracken, MD      . magnesium hydroxide (MILK OF MAGNESIA) suspension 30 mL  30 mL Oral Daily PRN Patrecia Pour, NP      . Derrill Memo ON 07/11/2017] paliperidone (INVEGA SUSTENNA) injection 156 mg  156 mg Intramuscular Q28 days Maris Berger T, MD      . paliperidone (INVEGA) 24 hr tablet 6 mg  6 mg Oral Daily Pennelope Bracken, MD   6 mg at 07/08/17 0758  . traZODone (DESYREL) tablet 50 mg  50 mg Oral QHS PRN,MR X 1 [place], Randa Ngo, MD        PTA Medications: Medications Prior to Admission  Medication Sig Dispense Refill Last Dose  . azithromycin (ZITHROMAX Z-PAK) 250 MG tablet 2 po day one, then 1 daily x 4 days (Patient not taking: Reported on 05/10/2017) 5 tablet 0 Not Taking at Unknown time  . benzonatate (TESSALON) 100 MG capsule Take 1 capsule (100 mg total) by mouth every 8 (eight) hours. (Patient not taking: Reported on 07/05/2017) 21 capsule 0 Not Taking at Unknown time  . guaiFENesin-codeine 100-10 MG/5ML syrup Take 5-10 mLs by mouth every 6 (six) hours as needed for cough. (Patient not taking: Reported on 05/10/2017) 120 mL 0 Not Taking at Unknown time  . predniSONE (DELTASONE) 20 MG tablet Take 2 tablets (40 mg total) by mouth daily. (Patient not taking: Reported on 05/10/2017) 10 tablet 0 Not Taking at  Unknown time  . pseudoephedrine (SUDAFED 12 HOUR) 120 MG 12 hr tablet Take 1 tablet (120 mg total) by mouth 2 (two) times daily. (Patient not taking: Reported on 07/05/2017) 30 tablet 0 Not Taking at Unknown time  . risperiDONE (RISPERDAL) 2 MG tablet Take 2 mg by mouth as needed (agitation).   Past Month at Unknown time    Patient Stressors: Marital or family conflict Medication change or noncompliance  Patient Strengths: Ability for insight Average or above average intelligence Capable of independent living General fund of knowledge Motivation for treatment/growth  Treatment Modalities: Medication Management, Group therapy, Case management,  1 to 1 session with clinician, Psychoeducation, Recreational therapy.   Physician Treatment Plan for Primary Diagnosis: Schizophrenia ([place]) Long Term Goal(s): Improvement in symptoms so as ready for discharge  Short Term Goals: Ability to identify and develop effective coping behaviors will improve Ability to maintain clinical measurements within normal limits will improve  Medication Management: Evaluate patient's response, side effects, and tolerance of medication regimen.  Therapeutic Interventions: 1 to 1 sessions, Unit Group sessions and Medication administration.  Evaluation of Outcomes: Progressing  Physician Treatment Plan for Secondary Diagnosis: Principal Problem:   Schizophrenia ([place])   Long Term Goal(s): Improvement in symptoms so as ready for discharge  Short Term Goals: Ability to identify and develop effective coping behaviors will improve Ability to maintain clinical measurements within normal limits will  improve  Medication Management: Evaluate patient's response, side effects, and tolerance of medication regimen.  Therapeutic Interventions: 1 to 1 sessions, Unit Group sessions and Medication administration.  Evaluation of Outcomes: Progressing   RN Treatment Plan for Primary Diagnosis: Schizophrenia ([place]) Long Term  Goal(s): Knowledge of disease and therapeutic regimen to maintain health will improve  Short Term Goals: Ability to identify and develop effective coping behaviors will improve and Compliance with prescribed medications will improve  Medication Management: RN will administer medications as ordered by provider, will assess and evaluate patient's response and provide education to patient for prescribed medication. RN will report any adverse and/or side effects to prescribing provider.  Therapeutic Interventions: 1 on 1 counseling sessions, Psychoeducation, Medication administration, Evaluate responses to treatment, Monitor vital signs and CBGs as ordered, Perform/monitor CIWA, COWS, AIMS and Fall Risk screenings as ordered, Perform wound care treatments as ordered.  Evaluation of Outcomes: Progressing   LCSW Treatment Plan for Primary Diagnosis: Schizophrenia ([place]) Long Term Goal(s): Safe transition to appropriate next level of care at discharge, Engage patient in therapeutic group addressing interpersonal concerns.  Short Term Goals: Engage patient in aftercare planning with referrals and resources  Therapeutic Interventions: Assess for all discharge needs, 1 to 1 time with Social worker, Explore available resources and support systems, Assess for adequacy in community support network, Educate family and significant other(s) on suicide prevention, Complete Psychosocial Assessment, Interpersonal group therapy.  Evaluation of Outcomes: Met  Return home, follow up [facility]   Progress in Treatment: Attending groups: Yes Participating in groups: Yes Taking medication as prescribed: Yes Toleration medication: Yes, no side effects reported at this time Family/Significant other contact made: Yes Patient understands diagnosis: Yes AEB asking for help with depression, AH Discussing patient identified problems/goals with staff: Yes Medical problems stabilized or resolved: Yes Denies  suicidal/homicidal ideation: Yes Issues/concerns per patient self-inventory: None Other: N/A  New problem(s) identified: None identified at this time.   New Short Term/Long Term Goal(s): "I want to get back to not thinking about killing myself, to not hearing voices, to not picturing myself dying.  Also, learning to deal with stress better."   Discharge Plan or Barriers:   Reason for Continuation of Hospitalization: Depression Hallucinations  Medication stabilization Suicidal ideation   Estimated Length of Stay: 6/12  Attendees: Patient: Isaiah Black 07/08/2017  10:10 AM  Physician: Maris Berger, MD 07/08/2017  10:10 AM  Nursing: Sena Hitch, RN 07/08/2017  10:10 AM  RN Care Manager: Lars Pinks, RN 07/08/2017  10:10 AM  Social Worker: Ripley Fraise 07/08/2017  10:10 AM  Recreational Therapist: Winfield Cunas 07/08/2017  10:10 AM  Other: Norberto Sorenson 07/08/2017  10:10 AM  Other:  07/08/2017  10:10 AM    Scribe for Treatment Team:  Roque Lias LCSW 07/08/2017 10:10 AM
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
## 2017-07-08 NOTE — Plan of Care (Signed)
  Problem: Activity: Goal: Interest or engagement in activities will improve Outcome: Progressing   Problem: Safety: Goal: Periods of time without injury will increase Outcome: Progressing   Problem: Medication: Goal: Compliance with prescribed medication regimen will improve Outcome: Progressing   DAR NOTE: Pt present with flat affect and depressed mood in the unit. Pt has been isolating himself, not interacting with peers.  Pt denies physical pain, took all his meds as scheduled. As per self inventory, pt had a fair night sleep, fair appetite, low energy, and poor concentration. Pt rate depression at 5, hopeless ness at 5, and anxiety at 5. Pt's safety ensured with 15 minute and environmental checks. Pt currently denies SI/HI and A/V hallucinations. Pt verbally agrees to seek staff if SI/HI or A/VH occurs and to consult with staff before acting on these thoughts. Will continue POC.

## 2017-07-08 NOTE — Plan of Care (Signed)
D: Pt passive SI/ AVH- contracts for safety, AVH- very less . Pt is pleasant and cooperative. Pt stated he was feeling better , " since the shot things are brighter"   A: Pt was offered support and encouragement. Pt was given scheduled medications. Pt was encourage to attend groups. Q 15 minute checks were done for safety.   R:Pt attends groups and interacts well with peers and staff. Pt is taking medication. Pt has no complaints.Pt receptive to treatment and safety maintained on unit.   Problem: Education: Goal: Emotional status will improve Outcome: Progressing   Problem: Education: Goal: Mental status will improve Outcome: Progressing   Problem: Activity: Goal: Sleeping patterns will improve Outcome: Progressing   Problem: Coping: Goal: Coping ability will improve Outcome: Progressing   Problem: Coping: Goal: Coping ability will improve Outcome: Progressing

## 2017-07-08 NOTE — Progress Notes (Signed)
Recreation Therapy Notes  Date: 6.7.19 Time: 1000 Location: 500 Hall Dayroom  Group Topic: Anger Management  Goal Area(s) Addresses:  Patient will identify triggers for anger.  Patient will identify physical reaction to anger.   Patient will identify benefit of using coping skills when angry.  Intervention:  Worksheet, pencils  Activity: Triggers.  Patients were given a worksheet in which they were to identify their biggest triggers.  Patients were to then describe what strategies they use to avoid or limit their interaction with their trigger and lastly what strategies they use when they can't avoid their triggers.  Education: Anger Management, Discharge Planning   Education Outcome: Acknowledges education/In group clarification offered/Needs additional education.   Clinical Observations/Feedback: Pt did not attend group.    Victorino Sparrow, LRT/CTRS          Victorino Sparrow A 07/08/2017 12:56 PM

## 2017-07-08 NOTE — Progress Notes (Signed)
Adult Psychoeducational Group Note  Date:  07/08/2017 Time:  9:02 PM  Group Topic/Focus:  Wrap-Up Group:   The focus of this group is to help patients review their daily goal of treatment and discuss progress on daily workbooks.  Participation Level:  Active  Participation Quality:  Appropriate  Affect:  Appropriate  Cognitive:  Appropriate  Insight: Appropriate  Engagement in Group:  Engaged  Modes of Intervention:  Discussion  Additional Comments: The patient expressed that he rates today a 7.The patient also said that he attended hope group.  Nash Shearer 07/08/2017, 9:02 PM

## 2017-07-09 LAB — PROLACTIN: PROLACTIN: 51.1 ng/mL — AB (ref 4.0–15.2)

## 2017-07-09 NOTE — BHH Group Notes (Signed)
[place] Group Notes:  (Nursing/MHT/Case Management/Adjunct)  Date:  07/09/2017  Time:  4:55 PM  Type of Therapy:  Psychoeducational Skills  Participation Level:  Did Not Attend   Coralyn Mark Illianna Paschal 07/09/2017, 4:55 PM

## 2017-07-09 NOTE — BHH Group Notes (Signed)
  BHH/BMU LCSW Group Therapy Note  Date/Time:  07/09/2017 11:15AM-12:00PM  Type of Therapy and Topic:  Group Therapy:  Feelings About Hospitalization  Participation Level:  Active   Description of Group This process group involved patients discussing their feelings related to being hospitalized, as well as the benefits they see to being in the hospital.  These feelings and benefits were itemized.  The group then brainstormed specific ways in which they could seek those same benefits when they discharge and return home.  Therapeutic Goals 1. Patient will identify and describe positive and negative feelings related to hospitalization 2. Patient will verbalize benefits of hospitalization to themselves personally 3. Patients will brainstorm together ways they can obtain similar benefits in the outpatient setting, identify barriers to wellness and possible solutions  Summary of Patient Progress:  The patient expressed his primary feelings about being hospitalized are "grateful because I'm starting to feel better."  He smiled spontaneously several times during group, and his eyes tracked each speaker in the room as they were talking.  He himself did not talk any further.  CSW remarked on how relaxed his face appeared, and he was in agreement that he is much less tense.  Therapeutic Modalities Cognitive Behavioral Therapy Motivational Interviewing    Selmer Dominion, LCSW 07/09/2017, 2:10 PM

## 2017-07-09 NOTE — Progress Notes (Signed)
Adult Psychoeducational Group Note  Date:  07/09/2017 Time:  8:55 PM  Group Topic/Focus:  Wrap-Up Group:   The focus of this group is to help patients review their daily goal of treatment and discuss progress on daily workbooks.  Participation Level:  Active  Participation Quality:  Appropriate  Affect:  Appropriate  Cognitive:  Appropriate  Insight: Appropriate  Engagement in Group:  Engaged  Modes of Intervention:  Discussion  Additional Comments The patient expressed that he rates today a 8.The patient also said that he attended group and his goal is to think positive.  Nash Shearer 07/09/2017, 8:55 PM

## 2017-07-09 NOTE — Plan of Care (Signed)
D: Pt denies SI/HI/AVH . Pt is pleasant and cooperative. Pt seen in dayroom for a little while , pt presents anxious , pt stated he was going to try the sleeping medication this evening.   A: Pt was offered support and encouragement. Pt was given scheduled medications. Pt was encourage to attend groups. Q 15 minute checks were done for safety.   R:Pt attends groups and interacts well with peers and staff. Pt is taking medication. Pt has no complaints.Pt receptive to treatment and safety maintained on unit.   Problem: Education: Goal: Emotional status will improve Outcome: Progressing   Problem: Education: Goal: Mental status will improve Outcome: Progressing   Problem: Activity: Goal: Sleeping patterns will improve Outcome: Progressing   Problem: Safety: Goal: Periods of time without injury will increase Outcome: Progressing
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
## 2017-07-09 NOTE — Plan of Care (Signed)
  Problem: Safety: Goal: Periods of time without injury will increase Outcome: Progressing   Problem: Medication: Goal: Compliance with prescribed medication regimen will improve Outcome: Progressing  DAR NOTE: Patient presents with anxious affect and depressed mood.  Denies suicidal thoughts, pain, auditory and visual hallucinations.  Rates depression at 4, hopelessness at 5, and anxiety at 4.  Maintained on routine safety checks.  Medications given as prescribed.  Support and encouragement offered as needed.  Attended group and participated.  States goal for today is "be more active."  Patient visible in milieu without interacting.  Offered no complaint.

## 2017-07-10 NOTE — [facility] Group Notes (Signed)
[facility] LCSW Group Therapy Note  Date/Time:  07/10/2017  11:00AM-12:00PM  Type of Therapy and Topic:  Group Therapy:  Music and Mood  Participation Level:  Active   Description of Group: In this process group, members listened to a variety of genres of music and identified that different types of music evoke different responses.  Patients were encouraged to identify music that was soothing for them and music that was energizing for them.  Patients discussed how this knowledge can help with wellness and recovery in various ways including managing depression and anxiety as well as encouraging healthy sleep habits.    Therapeutic Goals: 1. Patients will explore the impact of different varieties of music on mood 2. Patients will verbalize the thoughts they have when listening to different types of music 3. Patients will identify music that is soothing to them as well as music that is energizing to them 4. Patients will discuss how to use this knowledge to assist in maintaining wellness and recovery 5. Patients will explore the use of music as a coping skill  Summary of Patient Progress:  At the beginning of group, patient expressed that he felt "tired" and rated it at a 5 out of 10.  At the end of group he said it was improved, now about a 3.  He was attentive throughout group and spoke when called on, but not spontaneously.  Therapeutic Modalities: Solution Focused Brief Therapy Activity   Selmer Dominion, LCSW

## 2017-07-10 NOTE — [facility] Group Notes (Signed)
[facility] Group Notes:  (Nursing/MHT/Case Management/Adjunct)  Date:  07/10/2017  Time:  12:12 PM  Type of Therapy:  Psychoeducational Skills  Participation Level:  Did Not Attend  Participation Quality:    Affect:    Cognitive:    Insight:    Engagement in Group:    Modes of Intervention:    Summary of Progress/Problems:  Cammy Copa 07/10/2017, 12:12 PM
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
## 2017-07-10 NOTE — Plan of Care (Signed)
Pt progressing in the following metrics D: pt found in bed this morning; compliant with medication administration. Pt denies any physical pain. Pt stated he slept fair last night. Pt rates his depression/hopelessness/anxiety a 4/5/3 respectively out of 10. Pt stated his goal for today is to try to be more active and he will achieve this by trying to go to groups. Pt denies any si/hi/vh but describes ah as "background noise". Pt verbally agrees to approach staff if anything changes and contracts for safety. A: pt provided support and encouragement. Pt provided medications per standing orders and protocol. Q54m safety checks implemented and continued.  R: pt resting now. Pt safe on the unit. Will continue to monitor.  Problem: Education: Goal: Emotional status will improve Outcome: Progressing Goal: Mental status will improve Outcome: Progressing   Problem: Activity: Goal: Interest or engagement in activities will improve Outcome: Progressing Goal: Sleeping patterns will improve Outcome: Progressing   Problem: Coping: Goal: Ability to demonstrate self-control will improve Outcome: Progressing   Problem: Health Behavior/Discharge Planning: Goal: Compliance with treatment plan for underlying cause of condition will improve Outcome: Progressing   Problem: Physical Regulation: Goal: Ability to maintain clinical measurements within normal limits will improve Outcome: Progressing   Problem: Safety: Goal: Periods of time without injury will increase Outcome: Progressing   Problem: Education: Goal: Ability to make informed decisions regarding treatment will improve Outcome: Progressing   Problem: Coping: Goal: Coping ability will improve Outcome: Progressing   Problem: Medication: Goal: Compliance with prescribed medication regimen will improve Outcome: Progressing   Problem: Self-Concept: Goal: Ability to disclose and discuss suicidal ideas will improve Outcome: Progressing    Problem: Education: Goal: Utilization of techniques to improve thought processes will improve Outcome: Progressing Goal: Knowledge of the prescribed therapeutic regimen will improve Outcome: Progressing   Problem: Activity: Goal: Interest or engagement in leisure activities will improve Outcome: Progressing Goal: Imbalance in normal sleep/wake cycle will improve Outcome: Progressing   Problem: Coping: Goal: Will verbalize feelings Outcome: Progressing   Problem: Health Behavior/Discharge Planning: Goal: Ability to make decisions will improve Outcome: Progressing Goal: Compliance with therapeutic regimen will improve Outcome: Progressing   Problem: Self-Concept: Goal: Level of anxiety will decrease Outcome: Progressing   Problem: Education: Goal: Will be free of psychotic symptoms Outcome: Progressing Goal: Knowledge of the prescribed therapeutic regimen will improve Outcome: Progressing   Problem: Health Behavior/Discharge Planning: Goal: Compliance with prescribed medication regimen will improve Outcome: Progressing   Problem: Safety: Goal: Ability to remain free from injury will improve Outcome: Progressing

## 2017-07-10 NOTE — Plan of Care (Signed)
D: Pt denies SI/HI/VH, endorsed  AH- chatter, but minimal. Pt is pleasant and cooperative. Pt stated he was doing fine, pt said he still had issues sleeping and was willing to just try the vistaril to help him relax and possibly go to sleep.   A: Pt was offered support and encouragement. Pt was given scheduled medications. Pt was encourage to attend groups. Q 15 minute checks were done for safety.   R:Pt attends groups and interacts well with peers and staff. Pt is taking medication. Pt receptive to treatment and safety maintained on unit.   Problem: Education: Goal: Emotional status will improve Outcome: Progressing   Problem: Education: Goal: Mental status will improve Outcome: Progressing   Problem: Activity: Goal: Sleeping patterns will improve Outcome: Progressing   Problem: Safety: Goal: Periods of time without injury will increase Outcome: Progressing   Problem: Coping: Goal: Coping ability will improve Outcome: Progressing

## 2017-07-11 NOTE — Progress Notes (Signed)
D:  Patient's self inventory sheet, patient has good sleep, no sleep medication given.  Good appetite, normal energy level, good concentration.  Rated depression and hopeless #2, anxiety 3.  Denied withdrawals.  Denied SI.  Denied physical problems.  Denied physical pain.  Goal is new medications and discharge.  Plans to take medications and attend groups.  No discharge plans. A:  Medications administered per MD orders.  Emotional support and encouragement given patient. R:  Denied SI and HI, contracts for safety.  Denied A/V hallucinations.  Safety maintained with 15 minute checks.

## 2017-07-11 NOTE — Progress Notes (Signed)
Recreation Therapy Notes  Date: 6.10.19 Time: 1000 Location: 500 [address] Dayroom  Group Topic:  Goal Setting  Goal Area(s) Addresses:  Patient will be able to identify at least 3 life goals.  Patient will be able to identify benefit of investing in life goals.  Patient will be able to identify benefit of setting life goals.   Intervention: Worksheet  Activity: Lobbyist.  Patients were to identify goals they wanted to accomplish in a week, then next month, within a year and within the next 5 years.  Patients were to also identify obstacles to reaching goals, what they needed in order to achieve goals and what they an do tomorrow to start working towards their goals.  Education: Discharge Planning, Radiographer, therapeutic, Leisure Education  Education Outcome: Acknowledges Education/In Group Clarification Provided/Needs Additional Education  Clinical Observations: Pt did not attend group.    Victorino Sparrow, LRT/CTRS         Victorino Sparrow A 07/11/2017 12:41 PM

## 2017-07-11 NOTE — Plan of Care (Signed)
Nurse discussed depression, anxiety, coping skills with patient.  
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
## 2017-07-11 NOTE — Plan of Care (Signed)
D: Pt denies SI/HI/VH, AH- chatter but are decreasing. Pt is pleasant and cooperative. Pt visible on the unit at times , pt keeps to himself though. Pt stated he was excited about leaving tomorrow.   A: Pt was offered support and encouragement. Pt was given scheduled medications. Pt was encourage to attend groups. Q 15 minute checks were done for safety.   R:Pt attends groups and interacts well with peers and staff. Pt is taking medication. Pt has no complaints.Pt receptive to treatment and safety maintained on unit.   Problem: Education: Goal: Emotional status will improve Outcome: Progressing   Problem: Education: Goal: Mental status will improve Outcome: Progressing   Problem: Safety: Goal: Periods of time without injury will increase Outcome: Progressing   Problem: Coping: Goal: Coping ability will improve Outcome: Progressing

## 2017-07-12 MED ORDER — TRAZODONE HCL 50 MG PO TABS: 50.0000 mg | ORAL_TABLET | Freq: Every evening | ORAL | 0 refills | Status: DC | PRN

## 2017-07-12 MED ORDER — PALIPERIDONE PALMITATE ER 156 MG/ML IM SUSY: 156.0000 mg | PREFILLED_SYRINGE | INTRAMUSCULAR | 0 refills | Status: DC

## 2017-07-12 MED ORDER — PALIPERIDONE ER 6 MG PO TB24: 6.0000 mg | ORAL_TABLET | Freq: Every day | ORAL | 0 refills | Status: DC

## 2017-07-12 MED ORDER — FLUOXETINE HCL 20 MG PO CAPS: 20.0000 mg | ORAL_CAPSULE | Freq: Every day | ORAL | 0 refills | Status: DC

## 2017-07-12 MED ORDER — HYDROXYZINE HCL 50 MG PO TABS: 50.0000 mg | ORAL_TABLET | Freq: Four times a day (QID) | ORAL | 0 refills | Status: DC | PRN

## 2017-07-12 MED ORDER — TRAZODONE HCL 50 MG PO TABS
50.0000 mg | ORAL_TABLET | Freq: Every evening | ORAL | Status: DC | PRN
  Filled 2017-07-12: qty 7

## 2017-07-12 NOTE — Tx Team (Signed)
Interdisciplinary Treatment and Diagnostic Plan Update  07/12/2017 Time of Session: 8:48 AM  Majour Frei MRN: [id number]  Principal Diagnosis: Schizophrenia [facility])  Secondary Diagnoses: Principal Problem:   Schizophrenia ([place])   Current Medications:  Current Facility-Administered Medications  Medication Dose Route Frequency Provider Last Rate Last Dose  . alum & mag hydroxide-simeth (MAALOX/MYLANTA) 200-200-20 MG/5ML suspension 30 mL  30 mL Oral Q4H PRN Patrecia Pour, NP      . FLUoxetine (PROZAC) capsule 20 mg  20 mg Oral Daily Patrecia Pour, NP   20 mg at 07/12/17 0758  . hydrOXYzine (ATARAX/VISTARIL) tablet 50 mg  50 mg Oral Q6H PRN Pennelope Bracken, MD   50 mg at 07/11/17 2116  . magnesium hydroxide (MILK OF MAGNESIA) suspension 30 mL  30 mL Oral Daily PRN Patrecia Pour, NP      . paliperidone (INVEGA SUSTENNA) injection 156 mg  156 mg Intramuscular Q28 days Pennelope Bracken, MD   156 mg at 07/11/17 1016  . paliperidone (INVEGA) 24 hr tablet 6 mg  6 mg Oral Daily Pennelope Bracken, MD   6 mg at 07/12/17 0757  . traZODone (DESYREL) tablet 50 mg  50 mg Oral QHS PRN,MR X 1 Pennelope Bracken, MD   50 mg at 07/09/17 2143    PTA Medications: Medications Prior to Admission  Medication Sig Dispense Refill Last Dose  . azithromycin (ZITHROMAX Z-PAK) 250 MG tablet 2 po day one, then 1 daily x 4 days (Patient not taking: Reported on 05/10/2017) 5 tablet 0 Not Taking at Unknown time  . benzonatate (TESSALON) 100 MG capsule Take 1 capsule (100 mg total) by mouth every 8 (eight) hours. (Patient not taking: Reported on 07/05/2017) 21 capsule 0 Not Taking at Unknown time  . guaiFENesin-codeine 100-10 MG/5ML syrup Take 5-10 mLs by mouth every 6 (six) hours as needed for cough. (Patient not taking: Reported on 05/10/2017) 120 mL 0 Not Taking at Unknown time  . predniSONE (DELTASONE) 20 MG tablet Take 2 tablets (40 mg total) by mouth daily. (Patient not taking: Reported  on 05/10/2017) 10 tablet 0 Not Taking at Unknown time  . pseudoephedrine (SUDAFED 12 HOUR) 120 MG 12 hr tablet Take 1 tablet (120 mg total) by mouth 2 (two) times daily. (Patient not taking: Reported on 07/05/2017) 30 tablet 0 Not Taking at Unknown time  . risperiDONE (RISPERDAL) 2 MG tablet Take 2 mg by mouth as needed (agitation).   Past Month at Unknown time    Patient Stressors: Marital or family conflict Medication change or noncompliance  Patient Strengths: Ability for insight Average or above average intelligence Capable of independent living General fund of knowledge Motivation for treatment/growth  Treatment Modalities: Medication Management, Group therapy, Case management,  1 to 1 session with clinician, Psychoeducation, Recreational therapy.   Physician Treatment Plan for Primary Diagnosis: Schizophrenia ([place]) Long Term Goal(s): Improvement in symptoms so as ready for discharge  Short Term Goals: Ability to identify and develop effective coping behaviors will improve Ability to maintain clinical measurements within normal limits will improve  Medication Management: Evaluate patient's response, side effects, and tolerance of medication regimen.  Therapeutic Interventions: 1 to 1 sessions, Unit Group sessions and Medication administration.  Evaluation of Outcomes: Adequate for Discharge  Physician Treatment Plan for Secondary Diagnosis: Principal Problem:   Schizophrenia ([place])   Long Term Goal(s): Improvement in symptoms so as ready for discharge  Short Term Goals: Ability to identify and develop effective coping behaviors will improve Ability  to maintain clinical measurements within normal limits will improve  Medication Management: Evaluate patient's response, side effects, and tolerance of medication regimen.  Therapeutic Interventions: 1 to 1 sessions, Unit Group sessions and Medication administration.  Evaluation of Outcomes: Adequate for Discharge   RN  Treatment Plan for Primary Diagnosis: Schizophrenia ([place]) Long Term Goal(s): Knowledge of disease and therapeutic regimen to maintain health will improve  Short Term Goals: Ability to identify and develop effective coping behaviors will improve and Compliance with prescribed medications will improve  Medication Management: RN will administer medications as ordered by provider, will assess and evaluate patient's response and provide education to patient for prescribed medication. RN will report any adverse and/or side effects to prescribing provider.  Therapeutic Interventions: 1 on 1 counseling sessions, Psychoeducation, Medication administration, Evaluate responses to treatment, Monitor vital signs and CBGs as ordered, Perform/monitor CIWA, COWS, AIMS and Fall Risk screenings as ordered, Perform wound care treatments as ordered.  Evaluation of Outcomes: Adequate for Discharge   LCSW Treatment Plan for Primary Diagnosis: Schizophrenia [facility]) Long Term Goal(s): Safe transition to appropriate next level of care at discharge, Engage patient in therapeutic group addressing interpersonal concerns.  Short Term Goals: Engage patient in aftercare planning with referrals and resources  Therapeutic Interventions: Assess for all discharge needs, 1 to 1 time with Social worker, Explore available resources and support systems, Assess for adequacy in community support network, Educate family and significant other(s) on suicide prevention, Complete Psychosocial Assessment, Interpersonal group therapy.  Evaluation of Outcomes: Met  Return home, follow up [facility]   Progress in Treatment: Attending groups: Yes Participating in groups: Yes Taking medication as prescribed: Yes Toleration medication: Yes, no side effects reported at this time Family/Significant other contact made: Yes Patient understands diagnosis: Yes AEB asking for help with depression, AH Discussing patient identified problems/goals with  staff: Yes Medical problems stabilized or resolved: Yes Denies suicidal/homicidal ideation: Yes Issues/concerns per patient self-inventory: None Other: N/A  New problem(s) identified: None identified at this time.   New Short Term/Long Term Goal(s): "I want to get back to not thinking about killing myself, to not hearing voices, to not picturing myself dying.  Also, learning to deal with stress better."   Discharge Plan or Barriers:   Reason for Continuation of Hospitalization:   Estimated Length of Stay: D/C today  Attendees: Patient:  07/12/2017  8:48 AM  Physician: Maris Berger, MD 07/12/2017  8:48 AM  Nursing: Sena Hitch, RN 07/12/2017  8:48 AM  RN Care Manager: Lars Pinks, RN 07/12/2017  8:48 AM  Social Worker: Ripley Fraise 07/12/2017  8:48 AM  Recreational Therapist: Winfield Cunas 07/12/2017  8:48 AM  Other: Norberto Sorenson 07/12/2017  8:48 AM  Other:  07/12/2017  8:48 AM    Scribe for Treatment Team:  Roque Lias LCSW 07/12/2017 8:48 AM
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
## 2017-07-12 NOTE — Progress Notes (Signed)
Patient ID: Isaiah Black, male   DOB: [birth year], 42 y.o.   MRN: [id number] Patient discharged to home/self care in the presence of family.  Patient denies SI, HI and AVH.  Patient acknowledged understanding of all discharge instructions and receipt of all personal belongings.  Patient was eager to discharge and had a bright affect upon leaving.

## 2017-07-12 NOTE — Progress Notes (Signed)
  [facility] Adult Case Management Discharge Plan :  Will you be returning to the same living situation after discharge:  Yes,  home At discharge, do you have transportation home?: Yes,  family Do you have the ability to pay for your medications: Yes,  insurance  Release of information consent forms completed and in the chart;  Patient's signature needed at discharge.  Patient to Follow up at: [place] Follow up.   Why:  They will be glad to set up another appointment for you after you have taken care of your $60.00 balance Contact information: [place] [address] [phone]        [facility] Follow up on 07/14/2017.   Specialty:  Behavioral Health Why:  Thursday at [place] with Bettina Gavia for your hospital follow up appointment.  Please bring your ID, insurance info and hospital d/c paperwork Contact information: [place] [address] [phone]           Next level of care provider has access to [place] and Suicide Prevention discussed: Yes,  yes  Have you used any form of tobacco in the last 30 days? (Cigarettes, Smokeless Tobacco, Cigars, and/or Pipes): Yes  Has patient been referred to the Quitline?: Patient refused referral  Patient has been referred for addiction treatment: Pt. refused referral  Trish Mage, LCSW 07/12/2017, 8:47 AM

## 2019-03-05 ENCOUNTER — Emergency Department (HOSPITAL_COMMUNITY)
Admission: EM | Admit: 2019-03-05 | Discharge: 2019-03-06 | Disposition: A | Discharge status: Other Acute Inpatient Hospital | Payer: Medicaid Other | Attending: Emergency Medicine | Admitting: Emergency Medicine

## 2019-03-05 ENCOUNTER — Encounter (HOSPITAL_COMMUNITY): Payer: Self-pay | Admitting: Emergency Medicine

## 2019-03-05 DIAGNOSIS — R45851 Suicidal ideations: Secondary | ICD-10-CM | POA: Diagnosis not present

## 2019-03-05 DIAGNOSIS — F419 Anxiety disorder, unspecified: Secondary | ICD-10-CM | POA: Insufficient documentation

## 2019-03-05 DIAGNOSIS — F449 Dissociative and conversion disorder, unspecified: Secondary | ICD-10-CM | POA: Insufficient documentation

## 2019-03-05 DIAGNOSIS — R440 Auditory hallucinations: Secondary | ICD-10-CM

## 2019-03-05 DIAGNOSIS — F209 Schizophrenia, unspecified: Secondary | ICD-10-CM | POA: Diagnosis not present

## 2019-03-05 DIAGNOSIS — R799 Abnormal finding of blood chemistry, unspecified: Secondary | ICD-10-CM | POA: Diagnosis not present

## 2019-03-05 DIAGNOSIS — Z20822 Contact with and (suspected) exposure to covid-19: Secondary | ICD-10-CM | POA: Insufficient documentation

## 2019-03-05 LAB — CBC
HCT: 37.5 % — ABNORMAL LOW (ref 39.0–52.0)
Hemoglobin: 12.7 g/dL — ABNORMAL LOW (ref 13.0–17.0)
MCH: 30.9 pg (ref 26.0–34.0)
MCHC: 33.9 g/dL (ref 30.0–36.0)
MCV: 91.2 fL (ref 80.0–100.0)
Platelets: 270 10*3/uL (ref 150–400)
RBC: 4.11 MIL/uL — ABNORMAL LOW (ref 4.22–5.81)
RDW: 11.5 % (ref 11.5–15.5)
WBC: 10 10*3/uL (ref 4.0–10.5)
nRBC: 0 % (ref 0.0–0.2)

## 2019-03-05 LAB — COMPREHENSIVE METABOLIC PANEL
ALT: 27 U/L (ref 0–44)
AST: 38 U/L (ref 15–41)
Albumin: 3.7 g/dL (ref 3.5–5.0)
Alkaline Phosphatase: 49 U/L (ref 38–126)
Anion gap: 16 — ABNORMAL HIGH (ref 5–15)
BUN: 16 mg/dL (ref 6–20)
CO2: 18 mmol/L — ABNORMAL LOW (ref 22–32)
Calcium: 8.4 mg/dL — ABNORMAL LOW (ref 8.9–10.3)
Chloride: 107 mmol/L (ref 98–111)
Creatinine, Ser: 1.02 mg/dL (ref 0.61–1.24)
GFR calc Af Amer: >60 mL/min (ref >60)
GFR calc non Af Amer: >60 mL/min (ref >60)
Glucose, Bld: 124 mg/dL — ABNORMAL HIGH (ref 70–99)
Potassium: 3.9 mmol/L (ref 3.5–5.1)
Sodium: 141 mmol/L (ref 135–145)
Total Bilirubin: 0.9 mg/dL (ref 0.3–1.2)
Total Protein: 6.3 g/dL — ABNORMAL LOW (ref 6.5–8.1)

## 2019-03-05 LAB — SALICYLATE LEVEL: Salicylate Lvl: <7 mg/dL — ABNORMAL LOW (ref 7.0–30.0)

## 2019-03-05 LAB — ACETAMINOPHEN LEVEL: Acetaminophen (Tylenol), Serum: <10 ug/mL — ABNORMAL LOW (ref 10–30)

## 2019-03-05 LAB — ETHANOL: Alcohol, Ethyl (B): <10 mg/dL (ref <10)

## 2019-03-05 MED ORDER — SODIUM CHLORIDE 0.9 % IV BOLUS
1000.0000 mL | Freq: Once | INTRAVENOUS | Status: AC
  Administered 2019-03-06: 01:00:00 1000 mL via INTRAVENOUS

## 2019-03-05 NOTE — ED Triage Notes (Signed)
Pt arrives to hospital after walking here from a few miles away per pt. Pt states he was released from jail on Friday and has since been living on the street and has not had any of his medication. Pt states today she began to have SI thoughts and wanted to walk out in front of a car.

## 2019-03-05 NOTE — Social Work (Signed)
EDCSW met PT at bedside to provide homelessness resources

## 2019-03-05 NOTE — ED Provider Notes (Signed)
[name] EMERGENCY DEPARTMENT Provider Note   CSN: [id number] Arrival date & time: 03/05/19  1841     History Chief Complaint  Patient presents with  . Suicidal    Isaiah Black is a 44 y.o. male with a history of paranoid schizophrenia, disassociation disorder, and anxiety who presents to the emergency department with a chief complaint of auditory hallucinations.  The patient reports that he began hearing voices telling him to kill himself earlier today.  He does also report that he has been having suicidal thoughts over the last few days.  He reports that he had a plan to walk out in front of a bus.  He denies visual hallucinations or homicidal ideations.  He reports that he was incarcerated until 3 days ago.  He was compliant with his home Risperdal while he was incarcerated, but has not had a dose since Friday morning.  He denies fever, chills, chest pain, shortness of breath, bruising or bleeding, rashes, melena, hematochezia, rectal pain, abdominal pain, cough, nausea, vomiting, or diarrhea.  He is a non-smoker.  He denies illicit or recreational substance use.  Reports that he has not had any alcohol to drink since he was incarcerated.  Suspect that his last drink was approximately a year and a half ago.  The history is provided by the patient. No language interpreter was used.       Past Medical History:  Diagnosis Date  . Anxiety   . Disassociation disorder   . Paranoid schizophrenia [facility])     Patient Active Problem List   Diagnosis Date Noted  . Schizophrenia ([place]) 07/06/2017    History reviewed. No pertinent surgical history.     No family history on file.  Social History   Tobacco Use  . Smoking status: Former Research scientist (life sciences)  . Smokeless tobacco: Never Used  . Tobacco comment: vape  Substance Use Topics  . Alcohol use: Yes    Comment: occ  . Drug use: Yes    Types: Marijuana    Comment: Pt stated "I was vaping the pens"    Home  Medications Prior to Admission medications   Medication Sig Start Date End Date Taking? Authorizing Provider  albuterol (VENTOLIN HFA) 108 (90 Base) MCG/ACT inhaler Inhale 2 puffs into the lungs every 6 (six) hours as needed for wheezing or shortness of breath.   Yes [provider]  risperidone (RISPERDAL) 4 MG tablet Take 4 mg by mouth 2 (two) times daily.   Yes [provider]    Allergies    Patient has no known allergies.  Review of Systems   Review of Systems  Constitutional: Negative for appetite change, chills and fever.  Respiratory: Negative for shortness of breath.   Cardiovascular: Negative for chest pain and palpitations.  Gastrointestinal: Negative for abdominal pain, anal bleeding, blood in stool, diarrhea, nausea, rectal pain and vomiting.  Genitourinary: Negative for dysuria.  Musculoskeletal: Negative for back pain, joint swelling, neck pain and neck stiffness.  Skin: Negative for rash.  Allergic/Immunologic: Negative for immunocompromised state.  Neurological: Negative for dizziness, seizures, syncope, weakness, numbness and headaches.  Psychiatric/Behavioral: Positive for hallucinations and suicidal ideas. Negative for agitation, confusion and decreased concentration. The patient is not nervous/anxious.     Physical Exam Updated Vital Signs BP 104/75 (BP Location: Right Arm)   Pulse 91   Temp 98.6 F (37 C) (Oral)   Resp 18   SpO2 98%   Physical Exam Vitals and nursing note reviewed.  Constitutional:  General: He is not in acute distress.    Appearance: He is well-developed. He is not ill-appearing, toxic-appearing or diaphoretic.     Comments: Thin male.  No acute distress.  HENT:     Head: Normocephalic.  Eyes:     Conjunctiva/sclera: Conjunctivae normal.  Cardiovascular:     Rate and Rhythm: Normal rate and regular rhythm.     Pulses: Normal pulses.     Heart sounds: Normal heart sounds. No murmur. No friction rub. No gallop.    Pulmonary:     Effort: Pulmonary effort is normal. No respiratory distress.     Breath sounds: No stridor. No wheezing, rhonchi or rales.  Chest:     Chest wall: No tenderness.  Abdominal:     General: There is no distension.     Palpations: Abdomen is soft. There is no mass.     Tenderness: There is no abdominal tenderness. There is no right CVA tenderness, left CVA tenderness, guarding or rebound.     Hernia: No hernia is present.  Musculoskeletal:     Cervical back: Neck supple.     Right lower leg: No edema.     Left lower leg: No edema.  Skin:    General: Skin is warm and dry.     Capillary Refill: Capillary refill takes less than 2 seconds.     Coloration: Skin is not jaundiced.     Findings: No bruising or rash.  Neurological:     Mental Status: He is alert.  Psychiatric:        Attention and Perception: He perceives auditory hallucinations. He does not perceive visual hallucinations.        Mood and Affect: Affect is flat. Affect is not inappropriate.        Speech: Speech normal.        Behavior: Behavior is cooperative.        Thought Content: Thought content includes suicidal ideation. Thought content does not include homicidal ideation. Thought content includes suicidal plan. Thought content does not include homicidal plan.        Cognition and Memory: Cognition and memory normal.     Comments: Does not appear to be responding to internal stimuli     ED Results / Procedures / Treatments   Labs (all labs ordered are listed, but only abnormal results are displayed) Labs Reviewed  COMPREHENSIVE METABOLIC PANEL - Abnormal; Notable for the following components:      Result Value   CO2 18 (*)    Glucose, Bld 124 (*)    Calcium 8.4 (*)    Total Protein 6.3 (*)    Anion gap 16 (*)    All other components within normal limits  SALICYLATE LEVEL - Abnormal; Notable for the following components:   Salicylate Lvl Q000111Q (*)    All other components within normal limits   ACETAMINOPHEN LEVEL - Abnormal; Notable for the following components:   Acetaminophen (Tylenol), Serum <10 (*)    All other components within normal limits  CBC - Abnormal; Notable for the following components:   RBC 4.11 (*)    Hemoglobin 12.7 (*)    HCT 37.5 (*)    All other components within normal limits  RESPIRATORY PANEL BY RT PCR (FLU A&B, COVID)  ETHANOL  RAPID URINE DRUG SCREEN, HOSP PERFORMED    EKG None  Radiology No results found.  Procedures Procedures (including critical care time)  Medications Ordered in ED Medications  risperiDONE (RISPERDAL) tablet 4 mg (  has no administration in time range)  sodium chloride 0.9 % bolus 1,000 mL (0 mLs Intravenous Stopped 03/06/19 0349)    ED Course  I have reviewed the triage vital signs and the nursing notes.  Pertinent labs & imaging results that were available during my care of the patient were reviewed by me and considered in my medical decision making (see chart for details).    MDM Rules/Calculators/A&P                      44 year old male with a history of paranoid schizophrenia, disassociation disorder, and anxiety presenting with auditory hallucinations, onset today.  He has been having suicidal thoughts with a plan over the last few days.  No HI or visual hallucinations.  No other associated symptoms.  Hemoglobin is 12.7, down from 16 in June 2019.  No bruising or bleeding.  No melena or hematochezia.  Bicarb is also 18 with an anion gap of 16.  Will order IV fluid bolus.  Initial heart rate documented was 130s.  Pulse is not tachycardic on my exam.  Will repeat vitals.  Repeat vitals with normal heart rate.  Vitals are otherwise unremarkable.  Pt medically cleared at this time. Psych hold orders and home med orders placed. TTS consulted and recommended overnight observation; please see psych team notes for further documentation of care/dispo. Pt stable at time of med clearance.    Final Clinical Impression(s) /  ED Diagnoses Final diagnoses:  Suicidal ideation  Auditory hallucinations    Rx / DC Orders ED Discharge Orders    None       Baylee Mccorkel A, PA-C 03/06/19 Vazquez, Delice Bison, DO 03/06/19 [phone]

## 2019-03-06 ENCOUNTER — Other Ambulatory Visit: Payer: Self-pay

## 2019-03-06 ENCOUNTER — Encounter (HOSPITAL_COMMUNITY): Payer: Self-pay | Admitting: Psychiatry

## 2019-03-06 ENCOUNTER — Observation Stay (HOSPITAL_COMMUNITY)
Admission: AD | Admit: 2019-03-06 | Discharge: 2019-03-07 | Disposition: A | Discharge status: Home / Self Care | Payer: Medicaid Other | Source: Intra-hospital | Attending: Psychiatry | Admitting: Psychiatry

## 2019-03-06 DIAGNOSIS — Z818 Family history of other mental and behavioral disorders: Secondary | ICD-10-CM | POA: Diagnosis not present

## 2019-03-06 DIAGNOSIS — J449 Chronic obstructive pulmonary disease, unspecified: Secondary | ICD-10-CM | POA: Diagnosis not present

## 2019-03-06 DIAGNOSIS — Z79899 Other long term (current) drug therapy: Secondary | ICD-10-CM | POA: Insufficient documentation

## 2019-03-06 DIAGNOSIS — F419 Anxiety disorder, unspecified: Secondary | ICD-10-CM | POA: Diagnosis not present

## 2019-03-06 DIAGNOSIS — R45851 Suicidal ideations: Secondary | ICD-10-CM | POA: Insufficient documentation

## 2019-03-06 DIAGNOSIS — Z87891 Personal history of nicotine dependence: Secondary | ICD-10-CM | POA: Insufficient documentation

## 2019-03-06 DIAGNOSIS — F209 Schizophrenia, unspecified: Secondary | ICD-10-CM | POA: Diagnosis present

## 2019-03-06 DIAGNOSIS — F99 Mental disorder, not otherwise specified: Secondary | ICD-10-CM | POA: Diagnosis not present

## 2019-03-06 DIAGNOSIS — Z590 Homelessness: Secondary | ICD-10-CM | POA: Diagnosis not present

## 2019-03-06 DIAGNOSIS — F200 Paranoid schizophrenia: Principal | ICD-10-CM | POA: Insufficient documentation

## 2019-03-06 LAB — RESPIRATORY PANEL BY RT PCR (FLU A&B, COVID)
Influenza A by PCR: NEGATIVE
Influenza B by PCR: NEGATIVE
SARS Coronavirus 2 by RT PCR: NEGATIVE

## 2019-03-06 LAB — RAPID URINE DRUG SCREEN, HOSP PERFORMED
Amphetamines: NOT DETECTED
Barbiturates: NOT DETECTED
Benzodiazepines: NOT DETECTED
Cocaine: NOT DETECTED
Opiates: NOT DETECTED
Tetrahydrocannabinol: NOT DETECTED

## 2019-03-06 MED ORDER — HYDROXYZINE HCL 25 MG PO TABS
25.0000 mg | ORAL_TABLET | Freq: Three times a day (TID) | ORAL | Status: DC | PRN
  Administered 2019-03-06: 22:00:00 25 mg via ORAL
  Filled 2019-03-06: qty 1

## 2019-03-06 MED ORDER — ALUM & MAG HYDROXIDE-SIMETH 200-200-20 MG/5ML PO SUSP: 30.0000 mL | ORAL | Status: DC | PRN

## 2019-03-06 MED ORDER — ALBUTEROL SULFATE HFA 108 (90 BASE) MCG/ACT IN AERS: 2.0000 | INHALATION_SPRAY | Freq: Four times a day (QID) | RESPIRATORY_TRACT | Status: DC | PRN

## 2019-03-06 MED ORDER — ACETAMINOPHEN 325 MG PO TABS: 650.0000 mg | ORAL_TABLET | Freq: Four times a day (QID) | ORAL | Status: DC | PRN

## 2019-03-06 MED ORDER — RISPERIDONE 2 MG PO TABS
4.0000 mg | ORAL_TABLET | Freq: Two times a day (BID) | ORAL | Status: DC
  Administered 2019-03-06 – 2019-03-07 (×2): 4 mg via ORAL

## 2019-03-06 MED ORDER — MAGNESIUM HYDROXIDE 400 MG/5ML PO SUSP: 30.0000 mL | Freq: Every day | ORAL | Status: DC | PRN

## 2019-03-06 MED ORDER — RISPERIDONE 2 MG PO TABS
4.0000 mg | ORAL_TABLET | Freq: Two times a day (BID) | ORAL | Status: DC
  Filled 2019-03-06 (×2): qty 2

## 2019-03-06 MED ORDER — RISPERIDONE 3 MG PO TABS
4.0000 mg | ORAL_TABLET | Freq: Two times a day (BID) | ORAL | Status: DC
  Administered 2019-03-06: 4 mg via ORAL
  Filled 2019-03-06: qty 1

## 2019-03-06 NOTE — Progress Notes (Addendum)
Pt accepted to [facility]; 405-01.     Shuvon Rankin NP is the accepting provider.    Dr. Jake Samples is the attending provider.    Call report to [place] @ [facility] ED notified.     Pt is Voluntary.    Pt may be transported by [facility].   Pt scheduled to arrive at [facility] after 2:00pm.   Domenic Schwab, MSW, [place] Disposition Social Worker [facility]/TTS [phone]

## 2019-03-06 NOTE — H&P (Signed)
[facility] Provider Admission PAA/H&P  Patient Identification: Isaiah Black MRN:  [id number] Date of Evaluation:  03/06/2019 Chief Complaint:  Schizophrenia Principal Diagnosis: <principal problem not specified> Diagnosis:  Active Problems:   * No active hospital problems. *  History of Present Illness: Isaiah Black an 44 y.o.male, who presents voluntary and unaccompanied to [facility].Clinician asked the pt, "what brought you to the hospital?"Pt reported, "hearing voices and suicidal thoughts." Pt reported, he's been having suicidal thoughts for a couple of days; hearing voices telling him to kill himself starting today. Pt reported, he sees faces/shadows and always hear chatter. Pt reported, he has a plan to throw himself in front of a bus. Pt reported, he was released from jail on Friday (03/02/2019) after a year and a half for felony indecent liberties with a minor. Pt reported, he has not yet registered (as a sex offender) and is on probation for nine months. Pt denies, HI, self-injurious behaviors and access to weapons.  Pt denies, substance use. Pt's UDS is pending. Pt reported, while in jail he was prescribed Risperdal. Pt reported, he has not had a dose since he was released from jail. Pt denies, beinglinked to OPT resources (medication management and/or counseling.)Pt reported, previous inpatient admission to [facility] two years ago.   Psychiatric reassessment: This is a 44 year old male who presented to [facility] ED for concerns as noted above. During this evaluation, patient is alert and oriented x4, calm and cooperative. He continues to endorse suicidal thoughts with a plan to , "throw myself in front of a bus." He reports these suicidal thoughts started two days prior. He continues to endorse auditory hallucinations described as," hearing hatter and sometimes, hearing voices telling me to kill myself." He reports he has heard these voices for the past couple of days. He reports a  history of schizophrenia and as noted above, he reports he was incarcerated for a year and a half and while incarcerated, he was taking Risperidone. He denies visual hallucinations, paranoid ideation or delusions. He denies homicidal ideations. He denies history of suicide attempts. Denies substance abuse or use (UDS and ethanol negative). He was psychiatrically hospitalized here at [facility] per chart review 07/06/2017. He reports no current outpatient psychiatric services due to being incarcerated. At the time of his discharge, patient was prescribed Prozac 20 mg, Vistaril 50 mg q6 as needed, Invega 156mg /ml IM q28 days, Invega 6 mg po daily and Trazodone 50 mg at bedtime as needed. At this time, he is unable to contract for safety so disposition as noted below.   Associated Signs/Symptoms: Depression Symptoms:  suicidal thoughts with specific plan, (Hypo) Manic Symptoms:  none Anxiety Symptoms:  none Psychotic Symptoms:  Hallucinations: Auditory PTSD Symptoms: NA Total Time spent with patient: 20 minutes  Past Psychiatric History:Schizophrenia, anxiety, disassociation disorder. He was psychiatrically hospitalized here at [facility] per chart review 07/06/2017.   Is the patient at risk to self? Yes.    Has the patient been a risk to self in the past 6 months? No.  Has the patient been a risk to self within the distant past? Yes.    Is the patient a risk to others? No.  Has the patient been a risk to others in the past 6 months? No.  Has the patient been a risk to others within the distant past? No.   Alcohol Screening:   Substance Abuse History in the last 12 months:  No. Consequences of Substance Abuse: NA Previous Psychotropic Medications: Yes  Psychological Evaluations: Yes  Past Medical History:  Past Medical History:  Diagnosis Date  . Anxiety   . Disassociation disorder   . Paranoid schizophrenia ([place])    No past surgical history on file. Family History: No family history on file. Family  Psychiatric History: father hx of bipolar, maternal aunt hx of schizophrenia, paternal uncle hx of schizophrenia. No family history of suicide attempt or completion in family. Tobacco Screening:   Social History:  Social History   Substance and Sexual Activity  Alcohol Use Yes   Comment: occ     Social History   Substance and Sexual Activity  Drug Use Yes  . Types: Marijuana   Comment: Pt stated "I was vaping the pens"    Additional Social History:      Allergies:  No Known Allergies Lab Results:  Results for orders placed or performed during the hospital encounter of 03/05/19 (from the past 48 hour(s))  Comprehensive metabolic panel     Status: Abnormal   Collection Time: 03/05/19  7:03 PM  Result Value Ref Range   Sodium 141 135 - 145 mmol/L   Potassium 3.9 3.5 - 5.1 mmol/L   Chloride 107 98 - 111 mmol/L   CO2 18 (L) 22 - 32 mmol/L   Glucose, Bld 124 (H) 70 - 99 mg/dL   BUN 16 6 - 20 mg/dL   Creatinine, Ser 1.02 0.61 - 1.24 mg/dL   Calcium 8.4 (L) 8.9 - 10.3 mg/dL   Total Protein 6.3 (L) 6.5 - 8.1 g/dL   Albumin 3.7 3.5 - 5.0 g/dL   AST 38 15 - 41 U/L   ALT 27 0 - 44 U/L    Comment: RESULTS CONFIRMED BY MANUAL DILUTION   Alkaline Phosphatase 49 38 - 126 U/L   Total Bilirubin 0.9 0.3 - 1.2 mg/dL   GFR calc non Af Amer >60 >60 mL/min   GFR calc Af Amer >60 >60 mL/min   Anion gap 16 (H) 5 - 15    Comment: Performed at [facility], [address] [place], [place] [address]  Ethanol     Status: None   Collection Time: 03/05/19  7:03 PM  Result Value Ref Range   Alcohol, Ethyl (B) <10 <10 mg/dL    Comment: (NOTE) Lowest detectable limit for serum alcohol is 10 mg/dL. For medical purposes only. Performed at [facility], [place] [address] [place], [place] [id number]   Salicylate level     Status: Abnormal   Collection Time: 03/05/19  7:03 PM  Result Value Ref Range   Salicylate Lvl Q000111Q (L) 7.0 - 30.0 mg/dL    Comment: Performed at [facility] [address], [place], [place] [address]  Acetaminophen level     Status: Abnormal   Collection Time: 03/05/19  7:03 PM  Result Value Ref Range   Acetaminophen (Tylenol), Serum <10 (L) 10 - 30 ug/mL    Comment: (NOTE) Therapeutic concentrations vary significantly. A range of 10-30 ug/mL  may be an effective concentration for many patients. However, some  are best treated at concentrations outside of this range. Acetaminophen concentrations >150 ug/mL at 4 hours after ingestion  and >50 ug/mL at 12 hours after ingestion are often associated with  toxic reactions. Performed at [facility], [place] [address], [place], [place] [address]   cbc     Status: Abnormal   Collection Time: 03/05/19  7:03 PM  Result Value Ref Range  WBC 10.0 4.0 - 10.5 K/uL   RBC 4.11 (L) 4.22 - 5.81 MIL/uL   Hemoglobin 12.7 (L) 13.0 - 17.0 g/dL   HCT 37.5 (L) 39.0 - 52.0 %   MCV 91.2 80.0 - 100.0 fL   MCH 30.9 26.0 - 34.0 pg   MCHC 33.9 30.0 - 36.0 g/dL   RDW 11.5 11.5 - 15.5 %   Platelets 270 150 - 400 K/uL   nRBC 0.0 0.0 - 0.2 %    Comment: Performed at [facility], [place] [address] [place], [place] [address]  Respiratory Panel by RT PCR (Flu A&B, Covid) - Nasopharyngeal Swab     Status: None   Collection Time: 03/06/19  1:09 AM   Specimen: Nasopharyngeal Swab  Result Value Ref Range   SARS Coronavirus 2 by RT PCR NEGATIVE NEGATIVE    Comment: (NOTE) SARS-CoV-2 target nucleic acids are NOT DETECTED. The SARS-CoV-2 RNA is generally detectable in upper respiratoy specimens during the acute phase of infection. The lowest concentration of SARS-CoV-2 viral copies this assay can detect is 131 copies/mL. A negative result does not preclude SARS-Cov-2 infection and should not be used as the sole basis for treatment or other patient management decisions. A negative result may occur with  improper specimen collection/handling, submission of specimen other than nasopharyngeal swab, presence of  viral mutation(s) within the areas targeted by this assay, and inadequate number of viral copies (<131 copies/mL). A negative result must be combined with clinical observations, patient history, and epidemiological information. The expected result is Negative. Fact Sheet for Patients:  PinkCheek.be Fact Sheet for Healthcare Providers:  GravelBags.it This test is not yet ap proved or cleared by the [place] FDA and  has been authorized for detection and/or diagnosis of SARS-CoV-2 by FDA under an Emergency Use Authorization (EUA). This EUA will remain  in effect (meaning this test can be used) for the duration of the COVID-19 declaration under Section 564(b)(1) of the Act, 21 U.S.C. section 360bbb-3(b)(1), unless the authorization is terminated or revoked sooner.    Influenza A by PCR NEGATIVE NEGATIVE   Influenza B by PCR NEGATIVE NEGATIVE    Comment: (NOTE) The Xpert Xpress SARS-CoV-2/FLU/RSV assay is intended as an aid in  the diagnosis of influenza from Nasopharyngeal swab specimens and  should not be used as a sole basis for treatment. Nasal washings and  aspirates are unacceptable for Xpert Xpress SARS-CoV-2/FLU/RSV  testing. Fact Sheet for Patients: PinkCheek.be Fact Sheet for Healthcare Providers: GravelBags.it This test is not yet approved or cleared by the [place] FDA and  has been authorized for detection and/or diagnosis of SARS-CoV-2 by  FDA under an Emergency Use Authorization (EUA). This EUA will remain  in effect (meaning this test can be used) for the duration of the  Covid-19 declaration under Section 564(b)(1) of the Act, 21  U.S.C. section 360bbb-3(b)(1), unless the authorization is  terminated or revoked. Performed at [facility], [place] [address], [place], [place] [address]   Rapid urine drug screen (hospital performed)      Status: None   Collection Time: 03/06/19  9:19 AM  Result Value Ref Range   Opiates NONE DETECTED NONE DETECTED   Cocaine NONE DETECTED NONE DETECTED   Benzodiazepines NONE DETECTED NONE DETECTED   Amphetamines NONE DETECTED NONE DETECTED   Tetrahydrocannabinol NONE DETECTED NONE DETECTED   Barbiturates NONE DETECTED NONE DETECTED    Comment: (NOTE) DRUG SCREEN FOR MEDICAL PURPOSES ONLY.  IF CONFIRMATION IS NEEDED  FOR ANY PURPOSE, NOTIFY LAB WITHIN 5 DAYS. LOWEST DETECTABLE LIMITS FOR URINE DRUG SCREEN Drug Class                     Cutoff (ng/mL) Amphetamine and metabolites    1000 Barbiturate and metabolites    200 Benzodiazepine                 [id number] Tricyclics and metabolites     300 Opiates and metabolites        300 Cocaine and metabolites        300 THC                            50 Performed at [facility], [place] [address] [place], [place] [address]     Blood Alcohol level:  Lab Results  Component Value Date   [facility] <10 03/05/2019   ETH <10 [id number]    Metabolic Disorder Labs:  Lab Results  Component Value Date   HGBA1C 5.1 07/08/2017   MPG 99.67 07/08/2017   Lab Results  Component Value Date   PROLACTIN 51.1 (H) 07/08/2017   Lab Results  Component Value Date   CHOL 176 07/08/2017   TRIG 44 07/08/2017   HDL 58 07/08/2017   CHOLHDL 3.0 07/08/2017   VLDL 9 07/08/2017   LDLCALC 109 (H) 07/08/2017    Current Medications: No current facility-administered medications for this encounter.   PTA Medications: Medications Prior to Admission  Medication Sig Dispense Refill Last Dose  . albuterol (VENTOLIN HFA) 108 (90 Base) MCG/ACT inhaler Inhale 2 puffs into the lungs every 6 (six) hours as needed for wheezing or shortness of breath.     . risperidone (RISPERDAL) 4 MG tablet Take 4 mg by mouth 2 (two) times daily.       Musculoskeletal: Unable to assess as evaluation via telepsych.  Psychiatric Specialty Exam: Physical Exam  Vitals  reviewed. Constitutional: He is oriented to person, place, and time.  Neurological: He is alert and oriented to person, place, and time.    Review of Systems  Psychiatric/Behavioral: Positive for hallucinations and suicidal ideas.    Blood pressure 112/77, pulse (!) 118, temperature 98.9 F (37.2 C), temperature source Oral, resp. rate 18, height 5\' 4"  (1.626 m), weight 52.2 kg, SpO2 96 %.Body mass index is 19.74 kg/m.  General Appearance: Fairly Groomed  Eye Contact:  Fair  Speech:  Clear and Coherent and Normal Rate  Volume:  Normal  Mood:  Anxious and Depressed  Affect:  Constricted and Flat  Thought Process:  Coherent and Descriptions of Associations: Intact  Orientation:  Full (Time, Place, and Person)  Thought Content:  Hallucinations: Auditory  Suicidal Thoughts:  Yes.  with intent/plan  Homicidal Thoughts:  No  Memory:  Immediate;   Fair Recent;   Fair  Judgement:  Fair  Insight:  Fair  Psychomotor Activity:  Normal  Concentration:  Concentration: Fair and Attention Span: Fair  Recall:  [employer] of Knowledge:  Fair  Language:  Good  Akathisia:  Negative  Handed:  Right  AIMS (if indicated):     Assets:  Communication Skills Desire for Improvement Resilience  ADL's:  Intact  Cognition:  WNL  Sleep:         Treatment Plan Summary: Daily contact with patient to assess and evaluate symptoms and progress in treatment  Observation Level/Precautions:  15 minute checks:   Medications:  Risperdal 4 mg po BID    Estimated LOS:To be determined  Other: Patient to be reassessed by psychiatry 03/07/2019.      Mordecai Maes, NP 2/2/202112:51 PM

## 2019-03-06 NOTE — ED Notes (Signed)
Breakfast ordered 

## 2019-03-06 NOTE — Progress Notes (Signed)
Patient ID: Isaiah Black, male   DOB: 14-May-1975, 44 y.o.   MRN: [id number] DAR Note: Pt currently calm and cooperative, endorsing +SI with a plan to run infront of a bus, verbally contracting for safety on the unit.  Pt also endorsing +AH of voices telling him to kill himself and +VH of shadows. Q15 minute safety checks are in place, will give all meds as ordered.

## 2019-03-06 NOTE — Progress Notes (Signed)
Admission:   Pt isa caucasian male of43 years, presents Voluntary SI with plan to walk into traffic, endorses AVH to kill himself. Pt denies drugs or alcohol use. Pt has been homeless for the last 3 days . His mother lives in the areas.   Patient very cautious in acalmmood, appearance unkept,on assessmentandstating that hedoes notsleep well.Appetitereported as"good"and energy level reported "low".Patient endorsed depressionat5of 10,anxiety at8of10and agitation at 7  of 10.  Vital signs monitored. LBM 03/06/19. Pt orientated to unit and room. Meal accepted and eaten. Support and encouragement provided. Routine safety checks conducted every 15 minutes. Patientagreedto notify staff with problems or concerns.  No signs of distessobserved. Patient compliant with treatment plan. Patient receptive, calm, and cooperative. Patient interacts well with others on the unit. Patient remains safe at this time.    Isaiah Black. Isaiah Rumpf MSN, [place], [facility] [phone]

## 2019-03-06 NOTE — ED Notes (Signed)
Report called to [facility] pt  Accepted to bed 405-1. Pt has signed voluntary admission consent and copy faxed to [facility].

## 2019-03-06 NOTE — BHH Counselor (Signed)
Clinicain called TTS cart however there was no answer. Clinician to check back.     Vertell Novak, [place], [facility], [facility] Triage Specialist [phone]

## 2019-03-06 NOTE — ED Notes (Signed)
Per [employer], pt accepted to [facility] will advise staff after meeting.

## 2019-03-06 NOTE — BHH Counselor (Signed)
Clinician to call TTS cart in 10 minutes. Discussed with Threasa Beards, RN.    Vertell Novak, [place], [facility], [facility] Triage Specialist [phone]

## 2019-03-06 NOTE — ED Notes (Signed)
Pt in purple scrubs when care was resumed by this RN. Pt's belongings inventoried by sitter. Belongings placed in locker #1. Per sitter pt had debit card in pocket. Pt did not want locked in security.

## 2019-03-06 NOTE — [facility] Counselor (Signed)
Pt consented for clinician to call his mother Claude Manges, [phone]) to obtain collateral information. Clinician left a HIPPA compliant voice message with call back number.     Vertell Novak, MS, [facility], [facility] Triage Specialist [phone].

## 2019-03-06 NOTE — ED Notes (Signed)
Pt reminded x2 that urine sample was needed

## 2019-03-06 NOTE — Progress Notes (Deleted)
Admission:  Pt isa caucasian male of43years, presents Voluntary SI with  Plan to walk into tracffic, endorses AVH to kill himself.  Pt denies drugs or alcohol use.  . Pt has been homeless for the last 3 days. His mother lives in the areas.   Patient very cautious  in acalmmood, appearance unkept,on assessmentandstating that hedoes notsleep well .Appetitereported as"good"and energy level reported "normal".Patient endorsed depressionat5of 10,anxiety at8of10and agitation at 7  of 10.Pt states his problems stem from homeless use.Pain to both feet with numbness  at 7 of 10.   Skin:multiple areas of scar tissue over body, no erythema in any areas. Large tatto on leftt lower leg. Vital signs monitored. LBM 03/04/19. Pt orientated to unit and room. Meal accepted and eaten. Support and encouragement provided. Routine safety checks conducted every 15 minutes. Patientagreedto notify staff with problems or concerns.  No signs of distessobserved. Patient compliant with treatment plan. Patient receptive, calm, and cooperative. Patient interacts well with others on the unit. Patient remains safe at this time.    Lolly Mustache. Bobby Rumpf MSN, [place], [facility] [phone]

## 2019-03-06 NOTE — Progress Notes (Addendum)
Patient ID: Isaiah Black, male   DOB: 1975/06/24, 44 y.o.   MRN: [id number]  Reassessment   HPI: Isaiah Black is an 44 y.o. male, who presents voluntary and unaccompanied to [facility]. Clinician asked the pt, "what brought you to the hospital?" Pt reported, "hearing voices and suicidal thoughts." Pt reported, he's been having suicidal thoughts for a couple of days; hearing voices telling him to kill himself starting today. Pt reported, he sees faces/shadows and always hear chatter. Pt reported, he has a plan to throw himself in front of a bus. Pt reported, he was released from jail on Friday (03/02/2019) after a year and a half for felony indecent liberties with a minor. Pt reported, he has not yet registered (as a sex offender) and is on probation for nine months. Pt denies, HI, self-injurious behaviors and access to weapons.  Pt denies, substance use. Pt's UDS is pending. Pt reported, while in jail he was prescribed Risperdal. Pt reported, he has not had a dose since he was released from jail. Pt denies, being linked to OPT resources (medication management and/or counseling.) Pt reported, previous inpatient admission to [facility] two years ago.   Psychiatric reassessment: This is a 44 year old male who presented to [facility] ED for concerns as noted above. During this evaluation, patient is alert and oriented x4, calm and cooperative. He continues to endorse suicidal thoughts with a plan to , "throw myself in front of a bus." He reports these suicidal thoughts started two days prior. He continues to endorse auditory hallucinations described as," hearing hatter and sometimes, hearing voices telling me to kill myself." He reports he has heard these voices for the past couple of days. He reports a history of schizophrenia and as noted above, he reports he was incarcerated for a year and a half and while incarcerated, he was taking Risperidone. He denies visual hallucinations, paranoid ideation or delusions. He denies  homicidal ideations. He denies history of suicide attempts. Denies substance abuse or use (UDS and ethanol negative). He was psychiatrically hospitalized here at [facility] per chart review 07/06/2017. He reports no current outpatient psychiatric services due to being incarcerated. At the time of his discharge, patient was prescribed Prozac 20 mg, Vistaril 50 mg q6 as needed, Invega 156mg /ml IM q28 days, Invega 6 mg po daily and Trazodone 50 mg at bedtime as needed. At this time, he is unable to contract for safety so disposition as noted below.   Disposition: Case discussed with [facility] treatment team. Recommending overnight observation to monitor mood and stability. The plan was to restart medication although this medication was restarted (see MAR). Patient has been assigned bed 405-1 here at [facility]. Patient will be reassessed by psychiatry in the morning SARS resulted as negative.
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
## 2019-03-07 DIAGNOSIS — F200 Paranoid schizophrenia: Secondary | ICD-10-CM

## 2019-03-07 LAB — TSH: TSH: 5.024 u[IU]/mL — ABNORMAL HIGH (ref 0.350–4.500)

## 2019-03-07 LAB — HEMOGLOBIN A1C
Hgb A1c MFr Bld: 4.6 % — ABNORMAL LOW (ref 4.8–5.6)
Mean Plasma Glucose: 85.32 mg/dL

## 2019-03-07 LAB — LIPID PANEL
Cholesterol: 132 mg/dL (ref 0–200)
HDL: 42 mg/dL (ref >40)
LDL Cholesterol: 80 mg/dL (ref 0–99)
Total CHOL/HDL Ratio: 3.1 RATIO
Triglycerides: 48 mg/dL (ref <150)
VLDL: 10 mg/dL (ref 0–40)

## 2019-03-07 MED ORDER — RISPERIDONE 4 MG PO TABS: 4.0000 mg | ORAL_TABLET | Freq: Two times a day (BID) | ORAL | 1 refills | Status: DC

## 2019-03-07 NOTE — Progress Notes (Signed)
[place] NOVEL CORONAVIRUS (COVID-19) DAILY CHECK-OFF SYMPTOMS - answer yes or no to each - every day NO YES  Have you had a fever in the past 24 hours?  . Fever (Temp > 37.80C / 100F) X   Have you had any of these symptoms in the past 24 hours? . New Cough .  Sore Throat  .  Shortness of Breath .  Difficulty Breathing .  Unexplained Body Aches   X   Have you had any one of these symptoms in the past 24 hours not related to allergies?   . Runny Nose .  Nasal Congestion .  Sneezing   X   If you have had runny nose, nasal congestion, sneezing in the past 24 hours, has it worsened?  X   EXPOSURES - check yes or no X   Have you traveled outside the state in the past 14 days?  X   Have you been in contact with someone with a confirmed diagnosis of COVID-19 or PUI in the past 14 days without wearing appropriate PPE?  X   Have you been living in the same home as a person with confirmed diagnosis of COVID-19 or a PUI (household contact)?    X   Have you been diagnosed with COVID-19?    X              What to do next: Answered NO to all: Answered YES to anything:   Proceed with unit schedule Follow the [facility] Inpatient Flowsheet.   Jordyn Doane K. Naila Elizondo MSN, RN, [facility] [phone] 

## 2019-03-07 NOTE — BH Assessment (Signed)
[place] Assessment Progress Note  Per Johnn Hai, MD, this pt does not require psychiatric hospitalization at this time.  Pt is to be discharged from the [facility] Observation Unit with referral information for [facility].  This has been included in pt's discharge instructions, along with information for supportive services for the homeless.  Pt's nurse, Felipa Eth, has been notified.  Jalene Mullet, [place] Triage Specialist [phone]

## 2019-03-07 NOTE — Discharge Summary (Signed)
Physician Discharge Summary Note  Patient:  Isaiah Black is an 44 y.o., male MRN:  [id number] DOB:  1975-06-03 Patient phone:  [phone] (home)  Patient address:   [place] [address],  Total Time spent with patient: 45 minutes  Date of Admission:  03/06/2019 Date of Discharge: 03/07/2019  Reason for Admission:   History of Present Illness: Isaiah Black an 44 y.o.male, who presents voluntary and unaccompanied to [facility].Clinician asked the pt, "what brought you to the hospital?"Pt reported, "hearing voices and suicidal thoughts." Pt reported, he's been having suicidal thoughts for a couple of days; hearing voices telling him to kill himself starting today. Pt reported, he sees faces/shadows and always hear chatter. Pt reported, he has a plan to throw himself in front of a bus. Pt reported, he was released from jail on Friday (03/02/2019) after a year and a half for felony indecent liberties with a minor. Pt reported, he has not yet registered (as a sex offender) and is on probation for nine months. Pt denies, HI, self-injurious behaviors and access to weapons.  Pt denies, substance use. Pt's UDS is pending. Pt reported, while in jail he was prescribed Risperdal. Pt reported, he has not had a dose since he was released from jail. Pt denies, beinglinked to OPT resources (medication management and/or counseling.)Pt reported, previous inpatient admission to [facility] two years ago.  Psychiatric reassessment: This is a 44 year old male who presented to [facility] ED for concerns as noted above. During this evaluation, patient is alert and oriented x4, calm and cooperative. He continues to endorse suicidal thoughts with a plan to , "throwmyselfin front of a bus." He reports these suicidal thoughts started two days prior. He continues to endorse auditory hallucinations described as," hearing hatter and sometimes, hearing voices telling me to kill myself." He reports he has heard these voices for  the past couple of days. He reports a history of schizophrenia and as noted above, he reports he was incarcerated for a year and a half and while incarcerated, he was taking Risperidone. He denies visual hallucinations, paranoid ideation or delusions. He denies homicidal ideations. He denies history of suicide attempts. Denies substance abuse or use (UDS and ethanol negative). He was psychiatrically hospitalized here at [facility] per chart review 07/06/2017. He reports no current outpatient psychiatric services due to being incarcerated.At the time of his discharge, patient was prescribed Prozac 20 mg, Vistaril 50 mg q6 as needed, Invega 156mg /ml IM q28 days, Invega 6 mg po daily and Trazodone 50 mg at bedtime as needed.At this time, he is unable to contract for safetyso disposition as noted below.   Principal Problem: Homelessness/schizophrenia Discharge Diagnoses: Active Problems:   Schizophrenia [facility]   Past Psychiatric History: see eval  Past Medical History:  Past Medical History:  Diagnosis Date  . Anxiety   . Disassociation disorder   . Paranoid schizophrenia ([place])    History reviewed. No pertinent surgical history. Family History: History reviewed. No pertinent family history. Family Psychiatric  History:  Social History:  Social History   Substance and Sexual Activity  Alcohol Use Yes   Comment: occ     Social History   Substance and Sexual Activity  Drug Use Yes  . Types: Marijuana   Comment: Pt stated "I was vaping the pens"    Social History   Socioeconomic History  . Marital status: Single    Spouse name: Not on file  . Number of children: Not on file  . Years of  education: Not on file  . Highest education level: Not on file  Occupational History  . Not on file  Tobacco Use  . Smoking status: Former Research scientist (life sciences)  . Smokeless tobacco: Never Used  . Tobacco comment: vape  Substance and Sexual Activity  . Alcohol use: Yes    Comment: occ  . Drug use: Yes    Types:  Marijuana    Comment: Pt stated "I was vaping the pens"  . Sexual activity: Yes  Other Topics Concern  . Not on file  Social History Narrative  . Not on file   Social Determinants of Health   Financial Resource Strain:   . Difficulty of Paying Living Expenses: Not on file  Food Insecurity:   . Worried About Charity fundraiser in the Last Year: Not on file  . Ran Out of Food in the Last Year: Not on file  Transportation Needs:   . Lack of Transportation (Medical): Not on file  . Lack of Transportation (Non-Medical): Not on file  Physical Activity:   . Days of Exercise per Week: Not on file  . Minutes of Exercise per Session: Not on file  Stress:   . Feeling of Stress : Not on file  Social Connections:   . Frequency of Communication with Friends and Family: Not on file  . Frequency of Social Gatherings with Friends and Family: Not on file  . Attends Religious Services: Not on file  . Active Member of Clubs or Organizations: Not on file  . Attends Archivist Meetings: Not on file  . Marital Status: Not on file    Hospital Course:    Patient was monitored in the observation unit he clearly thinks his thoughts of self harm to having a place to stay.  He will be difficult to place given that he is a sex offender but he has displayed no dangerous behaviors here, requests to get back on his Risperdal, which we can prescribe, and agrees to contract fully as long as we have peer support give him some options for housing.  Again he understands that it may be difficult to place him.    Musculoskeletal: Strength & Muscle Tone: within normal limits Gait & Station: normal Patient leans: N/A  Psychiatric Specialty Exam: Physical Exam  Nursing note and vitals reviewed. Constitutional: He appears well-developed and well-nourished.  Cardiovascular: Normal rate and regular rhythm.    Review of Systems  Constitutional: Negative.   Eyes: Negative.   Respiratory: Negative.    Cardiovascular: Negative.   Endocrine: Negative.   Genitourinary: Negative.   Neurological: Negative.   Hematological: Negative.     Blood pressure 109/73, pulse 93, temperature 97.8 F (36.6 C), resp. rate 16, height 5\' 4"  (1.626 m), weight 52.2 kg, SpO2 99 %.Body mass index is 19.74 kg/m.  General Appearance: Casual and Disheveled  Eye Contact:  Fair  Speech:  Clear and Coherent  Volume:  Normal  Mood:  Euthymic  Affect:  Congruent  Thought Process:  Coherent and Goal Directed as far as hallucinations he denies them currently states they were there recently, will not elaborate content  Orientation:  Full (Time, Place, and Person)  Thought Content:  denies current halluc-  Suicidal Thoughts:  With regards to specific suicidal thoughts plans or intent, the patient elaborates that if we find him a place to live so he is not homeless he will contract fully for safety and will not have suicidal thoughts, acknowledging that this is a manipulative  statement.  He understands that housing may be very difficult to find given that he is a sex offender  Homicidal Thoughts:  No  Memory:  Immediate;   Fair Recent;   Fair Remote;   Fair  Judgement:  Fair  Insight:  Fair  Psychomotor Activity:  Normal  Concentration:  Concentration: Fair and Attention Span: Fair  Recall:  [employer] of Knowledge:  Fair  Language:  Fair  Akathisia:  Negative  Handed:  Right  AIMS (if indicated):     Assets:  Communication Skills Desire for Improvement  ADL's:  Intact  Cognition:  WNL  Sleep:           Has this patient used any form of tobacco in the last 30 days? (Cigarettes, Smokeless Tobacco, Cigars, and/or Pipes) Yes, No  Blood Alcohol level:  Lab Results  Component Value Date   ETH <10 03/05/2019   ETH <10 [id number]    Metabolic Disorder Labs:  Lab Results  Component Value Date   HGBA1C 4.6 (L) 03/07/2019   MPG 85.32 03/07/2019   MPG 99.67 07/08/2017   Lab Results  Component Value  Date   PROLACTIN 51.1 (H) 07/08/2017   Lab Results  Component Value Date   CHOL 132 03/07/2019   TRIG 48 03/07/2019   HDL 42 03/07/2019   CHOLHDL 3.1 03/07/2019   VLDL 10 03/07/2019   LDLCALC 80 03/07/2019   LDLCALC 109 (H) 07/08/2017    See Psychiatric Specialty Exam and Suicide Risk Assessment completed by Attending Physician prior to discharge.  Discharge destination:  Home  Is patient on multiple antipsychotic therapies at discharge:  No   Has Patient had three or more failed trials of antipsychotic monotherapy by history:  No  Recommended Plan for Multiple Antipsychotic Therapies: NA   Allergies as of 03/07/2019   No Known Allergies     Medication List    TAKE these medications     Indication  albuterol 108 (90 Base) MCG/ACT inhaler Commonly known as: VENTOLIN HFA Inhale 2 puffs into the lungs every 6 (six) hours as needed for wheezing or shortness of breath.  Indication: Chronic Obstructive Lung Disease   risperidone 4 MG tablet Commonly known as: RISPERDAL Take 1 tablet (4 mg total) by mouth 2 (two) times daily.  Indication: Schizophrenia       SignedJohnn Hai, MD 03/07/2019, 11:26 AM

## 2019-03-07 NOTE — Discharge Instructions (Signed)
For your mental health needs, you are advised to follow up with [facility].  Call them at your earliest opportunity to schedule an intake appointment:       [place]. [address]      [place], [place] [address]      [phone]      Crisis number: [phone]  For your shelter needs, you are advised to contact [facility] at your earliest opportunity:       [facility]      [phone]  For other supportive services, contact the [place]:       [facility]      [place], [place] [address]      [phone]

## 2019-03-07 NOTE — Progress Notes (Signed)
Discharge:   Pt is alert and oriented X 4. Pt given AVS packet to include medication information update, future appointment information, and other resource information. Pt given all belongings and signature obtained. Pt discharged to private resident with family member. Bus pass  Provided.   Lolly Mustache. Bobby Rumpf MSN, [place], [facility] [phone]

## 2019-03-08 ENCOUNTER — Other Ambulatory Visit: Payer: Self-pay

## 2019-03-08 ENCOUNTER — Emergency Department (HOSPITAL_COMMUNITY)
Admission: EM | Admit: 2019-03-08 | Discharge: 2019-03-08 | Disposition: A | Discharge status: Home / Self Care | Payer: Medicaid Other | Attending: Emergency Medicine | Admitting: Emergency Medicine

## 2019-03-08 ENCOUNTER — Encounter (HOSPITAL_COMMUNITY): Payer: Self-pay

## 2019-03-08 DIAGNOSIS — R251 Tremor, unspecified: Secondary | ICD-10-CM | POA: Diagnosis present

## 2019-03-08 DIAGNOSIS — T887XXA Unspecified adverse effect of drug or medicament, initial encounter: Secondary | ICD-10-CM | POA: Insufficient documentation

## 2019-03-08 DIAGNOSIS — F200 Paranoid schizophrenia: Secondary | ICD-10-CM | POA: Diagnosis not present

## 2019-03-08 LAB — CBC
HCT: 35.9 % — ABNORMAL LOW (ref 39.0–52.0)
Hemoglobin: 12 g/dL — ABNORMAL LOW (ref 13.0–17.0)
MCH: 30.8 pg (ref 26.0–34.0)
MCHC: 33.4 g/dL (ref 30.0–36.0)
MCV: 92.1 fL (ref 80.0–100.0)
Platelets: 235 10*3/uL (ref 150–400)
RBC: 3.9 MIL/uL — ABNORMAL LOW (ref 4.22–5.81)
RDW: 11.7 % (ref 11.5–15.5)
WBC: 5.6 10*3/uL (ref 4.0–10.5)
nRBC: 0 % (ref 0.0–0.2)

## 2019-03-08 LAB — BASIC METABOLIC PANEL
Anion gap: 13 (ref 5–15)
BUN: 10 mg/dL (ref 6–20)
CO2: 23 mmol/L (ref 22–32)
Calcium: 8.4 mg/dL — ABNORMAL LOW (ref 8.9–10.3)
Chloride: 105 mmol/L (ref 98–111)
Creatinine, Ser: 0.73 mg/dL (ref 0.61–1.24)
GFR calc Af Amer: >60 mL/min (ref >60)
GFR calc non Af Amer: >60 mL/min (ref >60)
Glucose, Bld: 123 mg/dL — ABNORMAL HIGH (ref 70–99)
Potassium: 3.5 mmol/L (ref 3.5–5.1)
Sodium: 141 mmol/L (ref 135–145)

## 2019-03-08 LAB — PROLACTIN: Prolactin: 100 ng/mL — ABNORMAL HIGH (ref 4.0–15.2)

## 2019-03-08 LAB — CBG MONITORING, ED: Glucose-Capillary: 116 mg/dL — ABNORMAL HIGH (ref 70–99)

## 2019-03-08 NOTE — ED Provider Notes (Signed)
[facility] Provider Note   CSN: [id number] Arrival date & time: 03/08/19  1051     History Chief Complaint  Patient presents with  . Tremors    Isaiah Black is a 44 y.o. male.  HPI Patient presents to the emergency department with tremors in his bilateral hands.  The patient states that he was started on an increased dose of Risperdal while in jail.  The patient states he started taking it again yesterday after being released from jail.  The patient states that he is on 4 mg twice a day.  He feels that this dose may be too strong and this was causing his symptoms.  The patient states that he has had no other symptoms other than feeling some generalized weakness since starting this medication.  The patient's been on this in the past.  The patient denies chest pain, shortness of breath, headache,blurred vision, neck pain, fever, cough,  numbness, dizziness, anorexia, edema, abdominal pain, nausea, vomiting, diarrhea, rash, back pain, dysuria, hematemesis, bloody stool, near syncope, or syncope.    Past Medical History:  Diagnosis Date  . Anxiety   . Disassociation disorder   . Paranoid schizophrenia [facility])     Patient Active Problem List   Diagnosis Date Noted  . Schizophrenia ([place]) 07/06/2017    History reviewed. No pertinent surgical history.     No family history on file.  Social History   Tobacco Use  . Smoking status: Former Research scientist (life sciences)  . Smokeless tobacco: Never Used  . Tobacco comment: vape  Substance Use Topics  . Alcohol use: Yes    Comment: occ  . Drug use: Yes    Types: Marijuana    Comment: Pt stated "I was vaping the pens"    Home Medications Prior to Admission medications   Medication Sig Start Date End Date Taking? Authorizing Provider  albuterol (VENTOLIN HFA) 108 (90 Base) MCG/ACT inhaler Inhale 2 puffs into the lungs every 6 (six) hours as needed for wheezing or shortness of breath.    [provider]    risperidone (RISPERDAL) 4 MG tablet Take 1 tablet (4 mg total) by mouth 2 (two) times daily. 03/07/19   Johnn Hai, MD    Allergies    Patient has no known allergies.  Review of Systems   Review of Systems All other systems negative except as documented in the HPI. All pertinent positives and negatives as reviewed in the HPI. Physical Exam Updated Vital Signs BP (!) 127/96   Pulse 90   Temp 98.9 F (37.2 C) (Oral)   Resp (!) 22   SpO2 100%   Physical Exam Vitals and nursing note reviewed.  Constitutional:      General: He is not in acute distress.    Appearance: He is well-developed.  HENT:     Head: Normocephalic and atraumatic.  Eyes:     Pupils: Pupils are equal, round, and reactive to light.  Cardiovascular:     Rate and Rhythm: Normal rate and regular rhythm.     Heart sounds: Normal heart sounds. No murmur. No friction rub. No gallop.   Pulmonary:     Effort: Pulmonary effort is normal. No respiratory distress.     Breath sounds: Normal breath sounds. No wheezing.  Abdominal:     General: Bowel sounds are normal. There is no distension.     Palpations: Abdomen is soft.     Tenderness: There is no abdominal tenderness.  Musculoskeletal:  Cervical back: Normal range of motion and neck supple.  Skin:    General: Skin is warm and dry.     Capillary Refill: Capillary refill takes less than 2 seconds.     Findings: No erythema or rash.  Neurological:     Mental Status: He is alert and oriented to person, place, and time.     Motor: No abnormal muscle tone.     Coordination: Coordination normal.  Psychiatric:        Mood and Affect: Affect is flat.        Behavior: Behavior is slowed.        Thought Content: Thought content is not paranoid or delusional. Thought content does not include homicidal or suicidal ideation. Thought content does not include homicidal or suicidal plan.     ED Results / Procedures / Treatments   Labs (all labs ordered are listed,  but only abnormal results are displayed) Labs Reviewed  BASIC METABOLIC PANEL - Abnormal; Notable for the following components:      Result Value   Glucose, Bld 123 (*)    Calcium 8.4 (*)    All other components within normal limits  CBC - Abnormal; Notable for the following components:   RBC 3.90 (*)    Hemoglobin 12.0 (*)    HCT 35.9 (*)    All other components within normal limits  CBG MONITORING, ED - Abnormal; Notable for the following components:   Glucose-Capillary 116 (*)    All other components within normal limits    EKG None  Radiology No results found.  Procedures Procedures (including critical care time)  Medications Ordered in ED Medications - No data to display  ED Course  I have reviewed the triage vital signs and the nursing notes.  Pertinent labs & imaging results that were available during my care of the patient were reviewed by me and considered in my medical decision making (see chart for details).    MDM Rules/Calculators/A&P                      Patient most likely is having side effect of the medication.  Patient does not have any extrapyramidal side effects at this time other than the tremor.  Patient does not have any tardive dyskinesia.  Patient is advised he will need to contact his psychiatrist for further evaluation and care. Final Clinical Impression(s) / ED Diagnoses Final diagnoses:  None    Rx / DC Orders ED Discharge Orders    None       Dalia Heading, PA-C 03/09/19 0900    Pattricia Boss, MD 03/09/19 1718

## 2019-03-08 NOTE — Discharge Instructions (Addendum)
Return here as needed.  Follow-up with your psychiatrist for evaluation of your medication dosage.  Feel that the tremor is related to the increase in your medication.

## 2019-03-08 NOTE — ED Triage Notes (Signed)
Pt from home with ems c.o tremors and bilateral leg weakness. Pt just released from [facility] yesterday, has been taking Risperidone. Denies any other complaints, pt a.o, denies SI/HI

## 2019-03-08 NOTE — ED Notes (Signed)
Patient verbalizes understanding of discharge instructions. Opportunity for questioning and answers were provided. Armband removed by staff, pt discharged from ED. Ambulated out to lobby  

## 2019-03-11 ENCOUNTER — Encounter (HOSPITAL_COMMUNITY): Payer: Self-pay | Admitting: *Deleted

## 2019-03-11 ENCOUNTER — Other Ambulatory Visit: Payer: Self-pay

## 2019-03-11 ENCOUNTER — Emergency Department (HOSPITAL_COMMUNITY)
Admission: EM | Admit: 2019-03-11 | Discharge: 2019-03-12 | Disposition: A | Discharge status: Home / Self Care | Payer: Medicaid Other | Attending: Emergency Medicine | Admitting: Emergency Medicine

## 2019-03-11 DIAGNOSIS — Z20822 Contact with and (suspected) exposure to covid-19: Secondary | ICD-10-CM | POA: Insufficient documentation

## 2019-03-11 DIAGNOSIS — R45851 Suicidal ideations: Secondary | ICD-10-CM | POA: Insufficient documentation

## 2019-03-11 DIAGNOSIS — F209 Schizophrenia, unspecified: Secondary | ICD-10-CM | POA: Diagnosis not present

## 2019-03-11 DIAGNOSIS — Z5900 Homelessness unspecified: Secondary | ICD-10-CM

## 2019-03-11 DIAGNOSIS — Z046 Encounter for general psychiatric examination, requested by authority: Secondary | ICD-10-CM | POA: Diagnosis present

## 2019-03-11 LAB — CBC
HCT: 39.8 % (ref 39.0–52.0)
Hemoglobin: 13.3 g/dL (ref 13.0–17.0)
MCH: 31.1 pg (ref 26.0–34.0)
MCHC: 33.4 g/dL (ref 30.0–36.0)
MCV: 93 fL (ref 80.0–100.0)
Platelets: 338 10*3/uL (ref 150–400)
RBC: 4.28 MIL/uL (ref 4.22–5.81)
RDW: 11.9 % (ref 11.5–15.5)
WBC: 7.2 10*3/uL (ref 4.0–10.5)
nRBC: 0 % (ref 0.0–0.2)

## 2019-03-11 LAB — COMPREHENSIVE METABOLIC PANEL
ALT: 21 U/L (ref 0–44)
AST: 19 U/L (ref 15–41)
Albumin: 3.8 g/dL (ref 3.5–5.0)
Alkaline Phosphatase: 54 U/L (ref 38–126)
Anion gap: 13 (ref 5–15)
BUN: 23 mg/dL — ABNORMAL HIGH (ref 6–20)
CO2: 24 mmol/L (ref 22–32)
Calcium: 8.8 mg/dL — ABNORMAL LOW (ref 8.9–10.3)
Chloride: 104 mmol/L (ref 98–111)
Creatinine, Ser: 0.76 mg/dL (ref 0.61–1.24)
GFR calc Af Amer: >60 mL/min (ref >60)
GFR calc non Af Amer: >60 mL/min (ref >60)
Glucose, Bld: 170 mg/dL — ABNORMAL HIGH (ref 70–99)
Potassium: 3.7 mmol/L (ref 3.5–5.1)
Sodium: 141 mmol/L (ref 135–145)
Total Bilirubin: 0.7 mg/dL (ref 0.3–1.2)
Total Protein: 6.5 g/dL (ref 6.5–8.1)

## 2019-03-11 LAB — ETHANOL: Alcohol, Ethyl (B): <10 mg/dL (ref <10)

## 2019-03-11 LAB — RESPIRATORY PANEL BY RT PCR (FLU A&B, COVID)
Influenza A by PCR: NEGATIVE
Influenza B by PCR: NEGATIVE
SARS Coronavirus 2 by RT PCR: NEGATIVE

## 2019-03-11 LAB — SALICYLATE LEVEL: Salicylate Lvl: <7 mg/dL — ABNORMAL LOW (ref 7.0–30.0)

## 2019-03-11 LAB — ACETAMINOPHEN LEVEL: Acetaminophen (Tylenol), Serum: <10 ug/mL — ABNORMAL LOW (ref 10–30)

## 2019-03-11 MED ORDER — LORAZEPAM 1 MG PO TABS: 1.0000 mg | ORAL_TABLET | ORAL | Status: DC | PRN

## 2019-03-11 MED ORDER — LORAZEPAM 1 MG PO TABS
1.0000 mg | ORAL_TABLET | Freq: Once | ORAL | Status: AC
  Administered 2019-03-11: 1 mg via ORAL
  Filled 2019-03-11: qty 1

## 2019-03-11 MED ORDER — ACETAMINOPHEN 500 MG PO TABS: 500.0000 mg | ORAL_TABLET | Freq: Four times a day (QID) | ORAL | Status: DC | PRN

## 2019-03-11 NOTE — ED Notes (Signed)
Pt belongings removed, changed into scrubs, wanded by security.

## 2019-03-11 NOTE — ED Notes (Signed)
Pt meeting with TTS
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
## 2019-03-11 NOTE — ED Triage Notes (Signed)
Pt arrives with [employer] under IVC, pt called mobile crisis reporting SI without a plan  Pt says he "is confused and unsure how to respond" Says he wants to die, no specific plan. Not taking his medications as prescribed. C/o pain in his toes, says he "has no desire to eat or drink".

## 2019-03-11 NOTE — ED Provider Notes (Signed)
[place] EMERGENCY DEPARTMENT Provider Note   CSN: [id number] Arrival date & time: 03/11/19  1955     History Chief Complaint  Patient presents with  . Suicidal    Isaiah Black is a 44 y.o. male hx of schizophrenia, here with suicidal ideation. Patient is not a very good historian. He was IVC by mobile crisis and per the IVC, patient has been depressed and suicidal also has visual hallucinations as well.  Patient was seen here recently for anxiety.  Patient is not taking his medicines.  Patient does not answer my questions and say repeatedly that he can't answer my questions.   The history is provided by the patient.       Past Medical History:  Diagnosis Date  . Anxiety   . Disassociation disorder   . Paranoid schizophrenia [facility])     Patient Active Problem List   Diagnosis Date Noted  . Schizophrenia ([place]) 07/06/2017    History reviewed. No pertinent surgical history.     No family history on file.  Social History   Tobacco Use  . Smoking status: Former Research scientist (life sciences)  . Smokeless tobacco: Never Used  . Tobacco comment: vape  Substance Use Topics  . Alcohol use: Yes    Comment: occ  . Drug use: Yes    Types: Marijuana    Comment: Pt stated "I was vaping the pens"    Home Medications Prior to Admission medications   Medication Sig Start Date End Date Taking? Authorizing Provider  albuterol (VENTOLIN HFA) 108 (90 Base) MCG/ACT inhaler Inhale 2 puffs into the lungs every 6 (six) hours as needed for wheezing or shortness of breath.    [provider]  risperidone (RISPERDAL) 4 MG tablet Take 1 tablet (4 mg total) by mouth 2 (two) times daily. 03/07/19   Johnn Hai, MD    Allergies    Patient has no known allergies.  Review of Systems   Review of Systems  Psychiatric/Behavioral: Positive for dysphoric mood.  All other systems reviewed and are negative.   Physical Exam Updated Vital Signs BP (!) 130/94 (BP Location: Right Arm)    Pulse (!) 112   Temp 98.5 F (36.9 C) (Oral)   Resp 20   SpO2 97%   Physical Exam Vitals and nursing note reviewed.  Constitutional:      Comments: Depressed   HENT:     Head: Normocephalic.     Nose: Nose normal.     Mouth/Throat:     Mouth: Mucous membranes are moist.  Eyes:     Extraocular Movements: Extraocular movements intact.     Pupils: Pupils are equal, round, and reactive to light.  Cardiovascular:     Rate and Rhythm: Normal rate.     Pulses: Normal pulses.  Pulmonary:     Effort: Pulmonary effort is normal.  Abdominal:     General: Abdomen is flat.     Palpations: Abdomen is soft.  Musculoskeletal:        General: Normal range of motion.     Cervical back: Normal range of motion.  Skin:    General: Skin is warm.     Capillary Refill: Capillary refill takes less than 2 seconds.  Neurological:     General: No focal deficit present.     Mental Status: He is oriented to person, place, and time.  Psychiatric:     Comments: Depressed      ED Results / Procedures / Treatments   Labs (  all labs ordered are listed, but only abnormal results are displayed) Labs Reviewed  COMPREHENSIVE METABOLIC PANEL - Abnormal; Notable for the following components:      Result Value   Glucose, Bld 170 (*)    BUN 23 (*)    Calcium 8.8 (*)    All other components within normal limits  SALICYLATE LEVEL - Abnormal; Notable for the following components:   Salicylate Lvl Q000111Q (*)    All other components within normal limits  ACETAMINOPHEN LEVEL - Abnormal; Notable for the following components:   Acetaminophen (Tylenol), Serum <10 (*)    All other components within normal limits  ETHANOL  CBC  RAPID URINE DRUG SCREEN, HOSP PERFORMED    EKG None  Radiology No results found.  Procedures Procedures (including critical care time)  Medications Ordered in ED Medications  LORazepam (ATIVAN) tablet 1 mg (has no administration in time range)    ED Course  I have reviewed  the triage vital signs and the nursing notes.  Pertinent labs & imaging results that were available during my care of the patient were reviewed by me and considered in my medical decision making (see chart for details).    MDM Rules/Calculators/A&P                      Isaiah Black is a 44 y.o. male here with suicidal ideation.  IVC by mobile crisis and I filled out the first exam.  Patient does not answer many questions.  Will get psych clearance labs and consult psych.   9:15 PM Medically cleared for psych eval.    Final Clinical Impression(s) / ED Diagnoses Final diagnoses:  None    Rx / DC Orders ED Discharge Orders    None       Drenda Freeze, MD 03/11/19 2115

## 2019-03-12 LAB — RAPID URINE DRUG SCREEN, HOSP PERFORMED
Amphetamines: NOT DETECTED
Barbiturates: NOT DETECTED
Benzodiazepines: POSITIVE — AB
Cocaine: NOT DETECTED
Opiates: NOT DETECTED
Tetrahydrocannabinol: NOT DETECTED

## 2019-03-12 NOTE — Discharge Instructions (Signed)
The testing we did for medical problems did not show any problems.  You do not have COVID-19 infection.  It is important to follow-up at [facility], for further treatment including medications and therapy.  Return here, if needed, for problems.

## 2019-03-12 NOTE — BH Assessment (Signed)
Reassessment Note: Provider Mordecai Maes, NP joined Armed forces training and education officer and pt for reassessment. Pt reports he came to the ED via police due to his mother saying he was suicidal. When asked if currently suicidal pt states "little bit:. He denies current suicidal plan or  plan proceeding this admission. Pt denies HI. He reports sx of social anxiety. He states he always hears whispering (unintelligible) and sees shadows. Risperdal is effective per pt, but he is non-compliant with meds due to no outpt tx provider. Pt denies he has been to [employer]. He was advised to go to [facility] for medication & outpt support. Pt reports he cannot return to his mother's home and he will be homeless. Per Tinnie Gens, NP pt is psych cleared. SW consult advised to assist with housing.

## 2019-03-12 NOTE — ED Provider Notes (Signed)
12:30 AM  Spoke with Isaiah Black with [place].  They recommend patient be reevaluated by psychiatry in the morning.   .   Isaiah Black, Isaiah Bison, DO 03/12/19 [phone]

## 2019-03-12 NOTE — ED Notes (Signed)
Pt informs this nurse he needs to reach out to his probation officer, but does not know the number, message left for [facility] probation per pt request.

## 2019-03-12 NOTE — Social Work (Signed)
EDCSW met with Pt at bedside and provided with housing resources. CSW also provided a hat and gloves

## 2019-03-12 NOTE — ED Notes (Addendum)
Pt d/c home per MD order , discharge summary reviewed with pt, pt verbalizes understanding. Bus pass provided. No s/s of acute distress noted, ambulatory off unit. Personal property returned.

## 2019-03-12 NOTE — ED Notes (Signed)
This nurse notified EDP Eulis Foster that pt is social work and psych cleared.

## 2019-03-12 NOTE — Care Management (Signed)
ED CM  and was consulted by Caryl Pina RN concerning patient being cleared by psych patient was recently released from prison with sex offender charges and is homeless.  CM met with patient at bedside on [address] to inquire about Inmate Re-entry Program, patient states he was not enrolled and has given permission conplete the the assessment with the [facility], also provided patient with contact information for the [place]

## 2019-03-12 NOTE — ED Notes (Signed)
Pt encouraged to provide urine sample per MD order  

## 2019-03-12 NOTE — ED Notes (Signed)
Tele psych machine at bedside 

## 2019-03-12 NOTE — ED Notes (Signed)
Dinner tray ordered.

## 2019-03-12 NOTE — ED Provider Notes (Signed)
5:25 PM-the patient was cleared by psychiatry around 11 AM.  Social work saw him because he did not have a place to go and they have given him resources and some cold weather gear.  He had presented with vague suicidal ideation yesterday, and appears to have chronic suicidal rumination without active plan.  Screening evaluation has been reassuring.  He does not have COVID-19.  Vital signs are normal.  At this time, he is alert and appears comfortable.  He was concerned about some "locking of my fingers," which have been present "for a long time."  Of note, his hands have normal range of motion of the hands and fingers, bilaterally.  He is alert and fairly lucid.  He does not appear depressed.   Isaiah Black was evaluated in Emergency Department on 03/12/2019 for the symptoms described in the history of present illness. He was evaluated in the context of the global COVID-19 pandemic, which necessitated consideration that the patient might be at risk for infection with the SARS-CoV-2 virus that causes COVID-19. Institutional protocols and algorithms that pertain to the evaluation of patients at risk for COVID-19 are in a state of rapid change based on information released by regulatory bodies including the CDC and federal and state organizations. These policies and algorithms were followed during the patient's care in the ED.   Plan: Follow-up at Lilyan Gilford, MD 03/12/19 [phone]

## 2019-03-12 NOTE — ED Notes (Signed)
Pt mother called this nurse requesting an update on POC- pt gives verbal consent for this nurse to talk to mom- mom requesting to be updated on POC by [facility] made aware.

## 2019-03-12 NOTE — ED Notes (Signed)
Dinner Tray Ordered @ 1710. 

## 2019-03-12 NOTE — ED Notes (Signed)
Breakfast tray ordered 

## 2019-03-12 NOTE — ED Notes (Signed)
Lunch tray ordered 

## 2019-03-12 NOTE — ED Notes (Signed)
Pt slept all throughout the night without any complaints.

## 2019-03-12 NOTE — ED Notes (Signed)
Reached out to social work r/t [facility] recommendations. EDP aware.

## 2019-03-12 NOTE — ED Notes (Signed)
Social work at bedside.  

## 2019-03-12 NOTE — ED Notes (Signed)
Pt ankle monitor battery charger gotten from pt property to charge monitor per pt request. Sitter present. Will continue to monitor.

## 2019-03-16 DIAGNOSIS — F251 Schizoaffective disorder, depressive type: Secondary | ICD-10-CM | POA: Insufficient documentation

## 2019-07-03 ENCOUNTER — Ambulatory Visit (INDEPENDENT_AMBULATORY_CARE_PROVIDER_SITE_OTHER): Payer: Medicaid Other | Admitting: Licensed Clinical Social Worker

## 2019-07-03 ENCOUNTER — Other Ambulatory Visit: Payer: Self-pay

## 2019-07-03 ENCOUNTER — Encounter (HOSPITAL_COMMUNITY): Payer: Self-pay | Admitting: Licensed Clinical Social Worker

## 2019-07-03 DIAGNOSIS — F200 Paranoid schizophrenia: Secondary | ICD-10-CM

## 2019-07-03 NOTE — Patient Instructions (Signed)
Pt is to come up with three goals that he would like to achieve on his own with little to no influence from outside help. Pt will Cont to use alarm system for medication reminders.
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
## 2019-07-17 ENCOUNTER — Encounter (HOSPITAL_COMMUNITY): Payer: Self-pay | Admitting: Psychiatry

## 2019-07-17 ENCOUNTER — Ambulatory Visit (INDEPENDENT_AMBULATORY_CARE_PROVIDER_SITE_OTHER): Payer: Medicaid Other | Admitting: Licensed Clinical Social Worker

## 2019-07-17 ENCOUNTER — Ambulatory Visit (INDEPENDENT_AMBULATORY_CARE_PROVIDER_SITE_OTHER): Payer: Medicaid Other | Admitting: Psychiatry

## 2019-07-17 ENCOUNTER — Other Ambulatory Visit: Payer: Self-pay

## 2019-07-17 DIAGNOSIS — F200 Paranoid schizophrenia: Secondary | ICD-10-CM

## 2019-07-17 MED ORDER — BENZTROPINE MESYLATE 1 MG PO TABS: 1.0000 mg | ORAL_TABLET | Freq: Two times a day (BID) | ORAL | 1 refills | Status: DC

## 2019-07-17 MED ORDER — PALIPERIDONE PALMITATE ER 234 MG/1.5ML IM SUSY: 234.0000 mg | PREFILLED_SYRINGE | Freq: Once | INTRAMUSCULAR | 0 refills | Status: DC

## 2019-07-17 MED ORDER — PALIPERIDONE ER 6 MG PO TB24: 12.0000 mg | ORAL_TABLET | ORAL | 0 refills | Status: DC

## 2019-07-17 MED ORDER — CITALOPRAM HYDROBROMIDE 20 MG PO TABS: 20.0000 mg | ORAL_TABLET | Freq: Every day | ORAL | 2 refills | Status: DC

## 2019-07-17 MED ORDER — PALIPERIDONE PALMITATE ER 234 MG/1.5ML IM SUSY: 234.0000 mg | PREFILLED_SYRINGE | Freq: Once | INTRAMUSCULAR | Status: DC
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
## 2019-07-17 NOTE — Patient Instructions (Signed)
Managing Schizophrenia If you have been diagnosed with schizophrenia, you may be relieved to know why you have felt or behaved a certain way. Still, you may have questions about the treatment ahead, how to get the support you need, and how to deal with the condition day-to-day. With care and support, you can learn to manage your symptoms and live with schizophrenia. How to manage lifestyle changes Managing stress Stress is your body's reaction to life changes and events, both good and bad. For people with schizophrenia, stress can sometimes cause an episode to start (can be a trigger), so it is important to learn ways to manage stress. Your health care provider, therapist, or counselor may suggest some techniques such as:  Meditation, muscle relaxation, and breathing exercises.  Music therapy. This can include creating music or listening to music.  Life skills training. This training is focused on work, self-care, money, house management, and social skills. Other things you can do to manage stress include:  Keeping a stress diary. This can help you learn what causes your stress to start and how you can control your response to those triggers.  Exercising. Even a short daily walk can help.  Getting enough sleep.  Making a schedule to manage your time. Knowing what you will do from day to day helps you avoid feeling overwhelmed by tasks and deadlines.  Spending time on hobbies you enjoy that help you relax.  Medicines Antipsychotic medicines are usually prescribed to help manage this condition. Make sure you:  Talk with your pharmacist or health care provider about all medicines that you take, the possible side effects, and which medicines are safe to take together.  Make it your goal to take part in all treatment decisions (shared decision-making). Ask about possible side effects of medicines that your health care provider recommends, and tell him or her how you feel about having those  side effects. It is best if shared decision-making with your health care provider is part of your total treatment plan. Relationships Having the support of your family and friends can play a major role in the success of your treatment. The following steps can help you maintain healthy relationships:  Think about going to couples therapy, family therapy, or family education classes.  Create a written plan for your treatment, and include close family members and friends in the process.  Consider bringing your partner or another family member or friend to the appointments you have with your health care provider. How to recognize changes in your condition If you find that your condition is getting worse, talk to your health care provider right away. Watch for these signs:  You hear, see, taste, and things that others do not (hallucinate).  You cannot set aside persistent, unusual thoughts or beliefs, no matter what others may believe.  Your speech becomes unclear.  You withdraw from friends and family members.  You have racing thoughts and have trouble thinking clearly or staying focused.  You have poor personal hygiene, weight gain or weight loss, or changes in how you are sleeping or eating. Follow these instructions at home:  Take over-the-counter and prescription medicines only as told by your health care provider. Do not start new medicines or stop taking medicines before you ask your health care provider if it is safe to make those changes.  Avoid caffeine, alcohol, and drugs. They can affect how your medicine works and can make your symptoms worse.  Eat a healthy diet.  Look for support groups  in your area so you can meet other people with your condition. You will learn different methods that others have used to manage schizophrenia.  Keep all follow-up visits as told by your health care provider, therapist, or counselor. This is important. Where to find support Talking to  others  Reach out to trusted friends or family members, explain your condition, and let them know that you are working with a health care team.  Consider giving educational materials to friends and family.  If you are having trouble telling your friends and family about your condition, keep in mind that honest and open communication can make these conversations easier. Finances Be sure to check with your insurance carrier to find out what treatment options are covered by your plan. You may also be able to find financial assistance through not-for-profit organizations or with local government-based resources. If you are taking medicines, you may be able to get the generic form, which may be less expensive than brand-name medicine. Some makers of prescription medicines also offer help to patients who cannot afford the medicines that they need. Therapy and support groups  Make sure you find a counselor or therapist who is familiar with schizophrenia. Meet with your counselor or therapist once a week or more often if needed.  Find support programs for people with schizophrenia. Where to find more information [employer] on Mental Illness: www.nami.org Contact a health care provider if:  You are not able to take your medicines as prescribed.  Your symptoms get worse. Get help right away if:  You have serious thoughts about hurting yourself or others. If you ever feel like you may hurt yourself or others, or have thoughts about taking your own life, get help right away. You can go to your nearest emergency department or call:  Your local emergency services (911 in the [place]  A suicide crisis helpline, such as the [facility] at 726-310-5487. This is open 24 hours a day. Summary  Schizophrenia is a lifelong illness. It is best controlled with continuous treatment that includes medicine and therapy.  Learning ways to deal with stress may help your treatment  work better.  Having the support of your family and friends can help to make your treatment a success.  If you find that your condition is getting worse, talk to your health care provider right away. This information is not intended to replace advice given to you by your health care provider. Make sure you discuss any questions you have with your health care provider. Document Revised: 05/12/2018 Document Reviewed: 05/20/2016 Elsevier Patient Education  [place].

## 2019-07-31 ENCOUNTER — Other Ambulatory Visit: Payer: Self-pay

## 2019-07-31 ENCOUNTER — Ambulatory Visit (INDEPENDENT_AMBULATORY_CARE_PROVIDER_SITE_OTHER): Payer: Medicaid Other | Admitting: Psychiatry

## 2019-07-31 ENCOUNTER — Encounter (HOSPITAL_COMMUNITY): Payer: Self-pay

## 2019-07-31 ENCOUNTER — Encounter (HOSPITAL_COMMUNITY): Payer: Self-pay | Admitting: Psychiatry

## 2019-07-31 ENCOUNTER — Ambulatory Visit (INDEPENDENT_AMBULATORY_CARE_PROVIDER_SITE_OTHER): Payer: Medicaid Other | Admitting: Licensed Clinical Social Worker

## 2019-07-31 ENCOUNTER — Ambulatory Visit (INDEPENDENT_AMBULATORY_CARE_PROVIDER_SITE_OTHER): Payer: Medicaid Other | Admitting: *Deleted

## 2019-07-31 VITALS — BP 109/77 | HR 90 | Temp 98.4°F | Ht 66.0 in | Wt 135.0 lb

## 2019-07-31 DIAGNOSIS — F200 Paranoid schizophrenia: Secondary | ICD-10-CM

## 2019-07-31 MED ORDER — TRAZODONE HCL 50 MG PO TABS: 25.0000 mg | ORAL_TABLET | Freq: Every day | ORAL | 2 refills | Status: DC

## 2019-07-31 MED ORDER — PALIPERIDONE PALMITATE ER 234 MG/1.5ML IM SUSY
234.0000 mg | PREFILLED_SYRINGE | Freq: Once | INTRAMUSCULAR | Status: AC
  Administered 2019-07-31: 234 mg via INTRAMUSCULAR

## 2019-07-31 MED ORDER — INVEGA SUSTENNA 156 MG/ML IM SUSY: 156.0000 mg | PREFILLED_SYRINGE | Freq: Once | INTRAMUSCULAR | 11 refills | Status: DC

## 2019-07-31 MED ORDER — BENZTROPINE MESYLATE 1 MG PO TABS: 1.0000 mg | ORAL_TABLET | Freq: Two times a day (BID) | ORAL | 1 refills | Status: DC

## 2019-07-31 MED ORDER — CITALOPRAM HYDROBROMIDE 20 MG PO TABS: 20.0000 mg | ORAL_TABLET | Freq: Every day | ORAL | 2 refills | Status: DC

## 2019-07-31 NOTE — Progress Notes (Signed)
BH MD/PA/NP OP Progress Note  07/31/2019 11:06 AM Isaiah Black  MRN:  [id number]  Chief Complaint: Per patient "I'm doing fine on the medications"                               Per mother "Sometimes he seems agitated but overall he's okay"  HPI: 71 old male seen today for follow-up psychiatric evaluation.  He has a psychiatric history of paranoid schizophrenia.  He is currently being managed on Cogentin 1 mg twice daily, and Celexa 20 mg daily and invega 6 mg BID.  On last visit Risperdal 4 mg twice daily  was discontinued and Invega 12 mg daily was initiated.   Patient reports that he tolerated oral Invega well and is agreeable to receiving his first Invega injection today.  He denies symptoms of depression endorses mild anxiety.  He  notes that he is able to cope with symptoms of anxiety.  He informed Probation officer that he is having problems sleeping he notes that he gets between 3 to 4 hours a night and wakes up throughout the night at least 2-5 times.  He also endorses auditory hallucinations stating that he hears whispers however notes that he is coping well with them. He denies SI/HI  He is agreeable to receive his first Invega injection today.  He notes that he has been having problems sleeping and is agreeable to starting trazodone 25 mg at bedtime to help improve sleep.  He will continue all other medications as prescribed.  No other concerns noted at this time.  visit Diagnosis:    ICD-10-CM   1. Paranoid schizophrenia ([place])  F20.0 benztropine (COGENTIN) 1 MG tablet    citalopram (CELEXA) 20 MG tablet    paliperidone (INVEGA SUSTENNA) 156 MG/ML SUSY injection    paliperidone (INVEGA SUSTENNA) injection 234 mg    traZODone (DESYREL) 50 MG tablet    Past Psychiatric History: paranoid schizophrenia, anxiety, and disassociation disorder.   Past Medical History:  Past Medical History:  Diagnosis Date  . Anxiety   . Disassociation disorder   . Paranoid schizophrenia ([place])    No past  surgical history on file.  Family Psychiatric History: Paternal uncle schizophrenia, Father Bipolar 1, Maternal aunt Bipolar 1, Niece depression    Family History: No family history on file.  Social History:  Social History   Socioeconomic History  . Marital status: Single    Spouse name: Not on file  . Number of children: Not on file  . Years of education: Not on file  . Highest education level: Not on file  Occupational History  . Not on file  Tobacco Use  . Smoking status: Former Research scientist (life sciences)  . Smokeless tobacco: Never Used  . Tobacco comment: vape  Vaping Use  . Vaping Use: Every day  Substance and Sexual Activity  . Alcohol use: Yes    Comment: occ  . Drug use: Not Currently    Types: Marijuana    Comment: Pt stated "I was vaping the pens"  . Sexual activity: Yes  Other Topics Concern  . Not on file  Social History Narrative  . Not on file   Social Determinants of Health   Financial Resource Strain: High Risk  . Difficulty of Paying Living Expenses: Very hard  Food Insecurity:   . Worried About Charity fundraiser in the Last Year:   . [place] in the Last Year:  Transportation Needs:   . Film/video editor (Medical):   Marland Kitchen Lack of Transportation (Non-Medical):   Physical Activity:   . Days of Exercise per Week:   . Minutes of Exercise per Session:   Stress:   . Feeling of Stress :   Social Connections:   . Frequency of Communication with Friends and Family:   . Frequency of Social Gatherings with Friends and Family:   . Attends Religious Services:   . Active Member of Clubs or Organizations:   . Attends Archivist Meetings:   Marland Kitchen Marital Status:     Allergies: No Known Allergies  Metabolic Disorder Labs: Lab Results  Component Value Date   HGBA1C 4.6 (L) 03/07/2019   MPG 85.32 03/07/2019   MPG 99.67 07/08/2017   Lab Results  Component Value Date   PROLACTIN 100.0 (H) 03/07/2019   PROLACTIN 51.1 (H) 07/08/2017   Lab Results   Component Value Date   CHOL 132 03/07/2019   TRIG 48 03/07/2019   HDL 42 03/07/2019   CHOLHDL 3.1 03/07/2019   VLDL 10 03/07/2019   LDLCALC 80 03/07/2019   LDLCALC 109 (H) 07/08/2017   Lab Results  Component Value Date   TSH 5.024 (H) 03/07/2019   TSH 6.137 (H) 07/08/2017    Therapeutic Level Labs: No results found for: LITHIUM No results found for: VALPROATE No components found for:  CBMZ  Current Medications: Current Outpatient Medications  Medication Sig Dispense Refill  . albuterol (VENTOLIN HFA) 108 (90 Base) MCG/ACT inhaler Inhale 2 puffs into the lungs every 6 (six) hours as needed for wheezing or shortness of breath.    . benztropine (COGENTIN) 1 MG tablet Take 1 tablet (1 mg total) by mouth 2 (two) times daily. 60 tablet 1  . citalopram (CELEXA) 20 MG tablet Take 1 tablet (20 mg total) by mouth daily. 30 tablet 2  . paliperidone (INVEGA SUSTENNA) 156 MG/ML SUSY injection Inject 1 mL (156 mg total) into the muscle once for 1 dose. 1 mL 11  . traZODone (DESYREL) 50 MG tablet Take 0.5 tablets (25 mg total) by mouth at bedtime. 30 tablet 2   Current Facility-Administered Medications  Medication Dose Route Frequency Provider Last Rate Last Admin  . paliperidone (INVEGA SUSTENNA) injection 234 mg  234 mg Intramuscular Once Eulis Canner E, NP      . paliperidone (INVEGA SUSTENNA) injection 234 mg  234 mg Intramuscular Once Salley Slaughter, NP         Musculoskeletal: Strength & Muscle Tone: within normal limits Gait & Station: normal Patient leans: N/A  Psychiatric Specialty Exam: Review of Systems  There were no vitals taken for this visit.There is no height or weight on file to calculate BMI.  General Appearance: Well Groomed  Eye Contact:  Good  Speech:  Clear and Coherent and Normal Rate  Volume:  Normal  Mood:  Euthymic  Affect:  Congruent  Thought Process:  Coherent, Goal Directed and Linear  Orientation:  Full (Time, Place, and Person)  Thought  Content: WDL and Logical   Suicidal Thoughts:  No  Homicidal Thoughts:  No  Memory:  Immediate;   Good Recent;   Good Remote;   Good  Judgement:  Good  Insight:  Good  Psychomotor Activity:  Normal  Concentration:  Concentration: Good and Attention Span: Good  Recall:  Good  Fund of Knowledge: Good  Language: Good  Akathisia:  No  Handed:  Right  AIMS (if indicated): not done  Assets:  Communication Skills Desire for Improvement Financial Resources/Insurance Housing Social Support  ADL's:  Intact  Cognition: WNL  Sleep:  Poor   Screenings: AIMS     Admission (Discharged) from 07/06/2017 in [place] [facility]  AIMS Total Score 0    AUDIT     Admission (Discharged) from 03/06/2019 in [place] Admission (Discharged) from 07/06/2017 in [place] [facility]  Alcohol Use Disorder Identification Test Final Score (AUDIT) 25 2    CAGE-AID     Counselor from 07/03/2019 in [facility]  CAGE-AID Score 0       Assessment and Plan: Patient notes that overall the Invega was tolerable with no notes side effects. He is agreeable to receive his first Invega injection today.  He notes that he has been having problems sleeping and is agreeable to starting trazodone 25 mg at bedtime to help improve sleep.  He will continue all other medications as prescribed  1. Paranoid schizophrenia ([place])  Continue- benztropine (COGENTIN) 1 MG tablet; Take 1 tablet (1 mg total) by mouth 2 (two) times daily.  Dispense: 60 tablet; Refill: 1 Continue- citalopram (CELEXA) 20 MG tablet; Take 1 tablet (20 mg total) by mouth daily.  Dispense: 30 tablet; Refill: 2 Start- paliperidone (INVEGA SUSTENNA) 156 MG/ML SUSY injection; Inject 1 mL (156 mg total) into the muscle once for 1 dose.  Dispense: 1 mL; Refill: 11 Received today as initial dose. Discontinued after today- paliperidone (INVEGA SUSTENNA) injection 234  mg Start- traZODone (DESYREL) 50 MG tablet; Take 0.5 tablets (25 mg total) by mouth at bedtime.  Dispense: 30 tablet; Refill: 2   Follow-up in 3 months.    Salley Slaughter, NP 07/31/2019, 11:06 AM

## 2019-07-31 NOTE — Progress Notes (Signed)
   THERAPIST PROGRESS NOTE  Session Time: 50  Subjective/Objective: Pt was alert and oriented x 5, dressed casually, with anxious affect. Sahith engaged well throughout assessment. Pt appeared restless and unable to control leg movement.  LCSW heard noise for "ankle monitor" Pt reports the battery is getting low because "I forgot to change it before I left". Mother reports she is going to get another one while therapy session is conducted. Pt states that new SSDI application was completed 6/25 for resume of income as pt lost SSDI when he was incarcerated. No timetable was reported for approval or denial of application. Pt reports increase of ADL activity brushing teeth daily and showering at least 2 x weekly. Increase exercise to daily (walking for 1-2 hours to different stores) Pt reports that he only goes with his mother due to ankle monitor and pt feels more comfortable with her there.    Assessment: Pt reports decrease hallucination volume, but still reports that he has auditory hallucinations nightly. Today patient endorses depressive symptoms.  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  He also endorses feelings of guilt, difficulty concentrating, disrupted sleep and weight gain.  At times he endorses distractibility, racing thoughts, and irritability.  Patient restless during exam.  First dose of injectable medication was provided for pt today, but pt does reports an overall increased mood and energy. Pt still meets criteria for paranoid schizophrenia    Plan: Increase ADL activity. Pt reports shower currently 2 x weekly, increase that to 3 x weekly, brush teeth daily, and now add in washing dishes 3 x weekly. Increase social interaction 1 magic trick to present for next session.    Participation Level: Active  Behavioral Response: CasualAlertNA  Type of Therapy: Individual Therapy  Treatment  Goals addressed: Diagnosis: paranoid schizophrenia   Interventions: Psychosocial Skills: ADLs   Summary: Isaiah Black is a 44 y.o. male who presents with paranoid schizophrenia .   Suicidal/Homicidal: NAwithout intent/plan    Plan: Return again in 2 weeks.  Diagnosis: Axis I: paranoid schizophrenia         Dory Horn, LCSW 07/31/2019

## 2019-07-31 NOTE — Patient Instructions (Signed)
Managing Schizophrenia If you have been diagnosed with schizophrenia, you may be relieved to know why you have felt or behaved a certain way. Still, you may have questions about the treatment ahead, how to get the support you need, and how to deal with the condition day-to-day. With care and support, you can learn to manage your symptoms and live with schizophrenia. How to manage lifestyle changes Managing stress Stress is your body's reaction to life changes and events, both good and bad. For people with schizophrenia, stress can sometimes cause an episode to start (can be a trigger), so it is important to learn ways to manage stress. Your health care provider, therapist, or counselor may suggest some techniques such as:  Meditation, muscle relaxation, and breathing exercises.  Music therapy. This can include creating music or listening to music.  Life skills training. This training is focused on work, self-care, money, house management, and social skills. Other things you can do to manage stress include:  Keeping a stress diary. This can help you learn what causes your stress to start and how you can control your response to those triggers.  Exercising. Even a short daily walk can help.  Getting enough sleep.  Making a schedule to manage your time. Knowing what you will do from day to day helps you avoid feeling overwhelmed by tasks and deadlines.  Spending time on hobbies you enjoy that help you relax.  Medicines Antipsychotic medicines are usually prescribed to help manage this condition. Make sure you:  Talk with your pharmacist or health care provider about all medicines that you take, the possible side effects, and which medicines are safe to take together.  Make it your goal to take part in all treatment decisions (shared decision-making). Ask about possible side effects of medicines that your health care provider recommends, and tell him or her how you feel about having those  side effects. It is best if shared decision-making with your health care provider is part of your total treatment plan. Relationships Having the support of your family and friends can play a major role in the success of your treatment. The following steps can help you maintain healthy relationships:  Think about going to couples therapy, family therapy, or family education classes.  Create a written plan for your treatment, and include close family members and friends in the process.  Consider bringing your partner or another family member or friend to the appointments you have with your health care provider. How to recognize changes in your condition If you find that your condition is getting worse, talk to your health care provider right away. Watch for these signs:  You hear, see, taste, and things that others do not (hallucinate).  You cannot set aside persistent, unusual thoughts or beliefs, no matter what others may believe.  Your speech becomes unclear.  You withdraw from friends and family members.  You have racing thoughts and have trouble thinking clearly or staying focused.  You have poor personal hygiene, weight gain or weight loss, or changes in how you are sleeping or eating. Follow these instructions at home:  Take over-the-counter and prescription medicines only as told by your health care provider. Do not start new medicines or stop taking medicines before you ask your health care provider if it is safe to make those changes.  Avoid caffeine, alcohol, and drugs. They can affect how your medicine works and can make your symptoms worse.  Eat a healthy diet.  Look for support groups  in your area so you can meet other people with your condition. You will learn different methods that others have used to manage schizophrenia.  Keep all follow-up visits as told by your health care provider, therapist, or counselor. This is important. Where to find support Talking to  others  Reach out to trusted friends or family members, explain your condition, and let them know that you are working with a health care team.  Consider giving educational materials to friends and family.  If you are having trouble telling your friends and family about your condition, keep in mind that honest and open communication can make these conversations easier. Finances Be sure to check with your insurance carrier to find out what treatment options are covered by your plan. You may also be able to find financial assistance through not-for-profit organizations or with local government-based resources. If you are taking medicines, you may be able to get the generic form, which may be less expensive than brand-name medicine. Some makers of prescription medicines also offer help to patients who cannot afford the medicines that they need. Therapy and support groups  Make sure you find a counselor or therapist who is familiar with schizophrenia. Meet with your counselor or therapist once a week or more often if needed.  Find support programs for people with schizophrenia. Where to find more information [employer] on Mental Illness: www.nami.org Contact a health care provider if:  You are not able to take your medicines as prescribed.  Your symptoms get worse. Get help right away if:  You have serious thoughts about hurting yourself or others. If you ever feel like you may hurt yourself or others, or have thoughts about taking your own life, get help right away. You can go to your nearest emergency department or call:  Your local emergency services (911 in the [place]  A suicide crisis helpline, such as the [place] at 352-620-5486. This is open 24 hours a day. Summary  Schizophrenia is a lifelong illness. It is best controlled with continuous treatment that includes medicine and therapy.  Learning ways to deal with stress may help your treatment  work better.  Having the support of your family and friends can help to make your treatment a success.  If you find that your condition is getting worse, talk to your health care provider right away. This information is not intended to replace advice given to you by your health care provider. Make sure you discuss any questions you have with your health care provider. Document Revised: 05/12/2018 Document Reviewed: 05/20/2016 Elsevier Patient Education  [place].

## 2019-07-31 NOTE — Progress Notes (Signed)
In for first appt for Invega Injection. He was accompanied by his mom who he lives with. He got out of the prison system in Jan 2021. He is pursuing disability and currently has Medicaid and did bring his injection with him. He has been on antipsychotic meds in the past in and out of prison but never on an injection. He is pleasant and appropriate, denies any current sx of mental illness. States he uses his time to walk and watch TV. Information given to him re possible side effects. Injection of 234 mg of Invega and to return in one month. He will return to see Brittney NP at the end of Sept. To call as needed. Tolerated shot without and difficulties.

## 2019-08-15 ENCOUNTER — Other Ambulatory Visit: Payer: Self-pay

## 2019-08-15 ENCOUNTER — Ambulatory Visit (INDEPENDENT_AMBULATORY_CARE_PROVIDER_SITE_OTHER): Payer: Medicaid Other | Admitting: Licensed Clinical Social Worker

## 2019-08-15 DIAGNOSIS — F200 Paranoid schizophrenia: Secondary | ICD-10-CM | POA: Diagnosis not present

## 2019-08-15 NOTE — Patient Instructions (Addendum)
Supporting Someone With Schizophrenia If you are a friend or family member of someone diagnosed with schizophrenia, you can play a helpful role in that person's life. Understanding as much as you can about schizophrenia will help you to be as supportive as possible. What do I need to know about this condition? Schizophrenia is a lifelong mental illness that causes episodes of severe loss of contact with reality (psychosis). It may interfere with personal relationships or normal daily activities. Symptoms of schizophrenia may be continuous, or they may come and go. Some common symptoms include:  Seeing, hearing, or feeling things that are not there (hallucinations).  Having fixed, false beliefs (delusions). These often involve a person believing that he or she is being attacked, harassed, cheated, persecuted, or plotted against.  Speech or behavior that is odd or disorganized or does not make sense to others (incoherent). What do I need to know about the treatment options? Schizophrenia requires long-term treatment. Treatment may include:  Medicines, such as antipsychotics. Other medicines may be added depending on the type and severity of the person's symptoms.  Counseling or talk therapy. Individual, group, or family counseling may be helpful in providing education, support, and guidance. Many people with schizophrenia also benefit from social skills and job skills (vocational) training.  Electroconvulsive therapy (ECT). This procedure involves applying short electrical pulses to the brain through the scalp to change brain chemicals (neurotransmitters). ECT may be used to treat people with severe symptoms if other treatments are not effective. A combination of medicine and counseling is usually best for managing this disorder over time. How can I support my loved one? Talk about the condition Good communication can be helpful in supporting your friend or family member. Here are a few things to  keep in mind:  Be careful about too much prodding. Try not to overdo reminders to an adult friend or family member about things like taking medicines. Ask how your loved one prefers that you help.  Be patient, listen well, and speak encouraging words.  Be available if your loved one wants to talk. Make an effort to acknowledge his or her feelings. Find support and resources A health care provider may be able to recommend mental health resources that are available online or over the phone. You could start with:  Substance Abuse and Mental Health Services Administration [facility]): ktimeonline.com  National Alliance on Mental Illness (NAMI): www.nami.org Think about joining self-help and support groups, not only for your friend or family member, but also for yourself. People in these peer and family support groups understand what you and your loved one are going through. They can help you feel a sense of hope and connect you with local resources to help you learn more. You could also try family or couples therapy. General support  Make an effort to learn all you can about schizophrenia.  Ask your loved one how you can best help him or her. Offer to help with daily responsibilities, such as laundry or meals.  Help your loved one follow his or her treatment plan as directed by health care providers. This could mean driving him or her to therapy sessions or suggesting ways to cope with stress, such as going for a walk together.  Do not argue with your loved one about his or her perception of reality. How can I create a safe environment?  Create a written crisis plan. Include important phone numbers, such as the local crisis intervention team. Make sure that: ? The person  with schizophrenia knows about this plan. ? Everyone who has regular contact with that person knows about the plan and knows what to do in an emergency.  Be aware that sometimes there is a risk of violence toward you when a  person has schizophrenia. Set limits when it comes to physical or verbal abuse. One thing you could do is tell your loved one that if he or she is getting violent, you will leave and then call the police. Talk ahead of time about your plans for handling violent situations. Doing that will help to make violent episodes easier to manage if they happen.  To lower the risk of violence or suicide during a crisis, remove or lock up guns and other weapons. If you do not have a safe place to keep a gun, local law enforcement may store a gun for you. How should I care for myself? Supporting someone with schizophrenia can cause stress. Be sure to find ways to take care of your body, mind, and well-being.  Find someone you can talk to who will also help you develop coping skills to manage stress.  Try different techniques for coping with stress, such as: ? Muscle relaxation, meditation, and breathing exercises. ? Exercise, even if it is just taking a short walk a few times a week. ? Getting the right amount of good-quality sleep.  Try to maintain your normal routines. This can help you remember that your life is about more than your loved one's condition.  Make time for activities that help you relax, and try to not feel guilty about taking time for yourself. What are some signs that the condition is getting worse? Warning signs that your loved one's condition is getting worse include:  Clear, sudden change in behavior or symptoms.  Struggling with daily activities.  Increased use of drugs or alcohol.  Not being able to fulfill basic needs (food, clothing, shelter). Warning signs that your loved one is thinking about suicide include:  Withdrawing from friends and family.  Talking about suicide or searching for methods.  Expressing feelings of being trapped or not having a purpose in life. Ask a counselor or your loved one's health care provider about when to get help if you are concerned about  behavior changes. Privacy laws limit how much a health care provider can share with you without your loved one's permission, but if you feel that a situation is an emergency, do not wait to call a health care provider or emergency services. Get help right away if:  You are in a situation that threatens your life. Leave the situation and call emergency services (911 in the [place] as soon as possible. If you ever feel like your loved one may hurt himself or herself or others, or may have thoughts about taking his or her own life, get help right away. You can go to your nearest emergency department or call:  Your local emergency services (911 in the [place]  A suicide crisis helpline, such as the [place] at 225-487-3805. This is open 24 hours a day. Summary  Schizophrenia is a lifelong illness that causes episodes of severe loss of contact with reality (psychosis).  Treatment for schizophrenia is ongoing, and it may include medicines, counseling, therapy, support groups, and social skills or job Company secretary.  Support that you give to your loved one is an important part of a successful treatment plan.  Make sure to take care of yourself while supporting someone with  schizophrenia. Get help when you need it.  Expect episodes, and have a plan for how to help your loved one manage them. If you feel threatened or are worried about your loved one's safety, contact your local emergency services as soon as possible. This information is not intended to replace advice given to you by your health care provider. Make sure you discuss any questions you have with your health care provider. Document Revised: 05/11/2018 Document Reviewed: 06/01/2016 Elsevier Patient Education  [place].

## 2019-08-15 NOTE — Progress Notes (Signed)
   THERAPIST PROGRESS NOTE  Session Time: 108   Therapist Response:   Subjective:  Pt report things have been improving. Recently he has received verification that he has a Fish farm manager appt on  7/23 which will be the initial intake after application was completed. Pt reports being nervous as this is the next step in order to get income and ultimately housing. Pt states he has started up with his "Magic tricks" as a hobby which he reports makes him happy.  He has increased daily hygiene routine and reports taking showers 2 to 3 times a week.  Pt reports overall he has gone from a 10 at his worst back in February to now a 5 since he has been on his medications and attending therapy   Objective: Qasim reported to session with mother. Pt was alert oriented x 5, dressed casually, well groomed (recent haircut). Pt was restless (leg moving up and down), but Jasn engaged throughout assessment with anxious affect.      Assessment: Pt endorses symptoms of hallucinations (voices) non command that he still hears at night. He reports when he first started seeing [place] they were at a 10 in loudness, now they are down to a 5. Pt reports taking all medication on time but reports insomnia, it will take pt 1 to 2 hours to fall asleep and he will wake up 1 x nightly hearing a voice saying "Derek". He continues to meet criteria for schizophrenia.     Plan:  Continue to take medication as prescribed, therapy every 2 weeks, 1 new magic trick per week, continue ADLs showers 2-3 times weekly & Brushing teeth/deodorant daily. Pt will continue to walk daily.    Participation Level: Active  Behavioral Response: CasualAlertAnxious  Type of Therapy: Individual Therapy  Treatment Goals addressed: Diagnosis: paranoid schizophrenia   Interventions: Solution Focused  Summary: Saunders Arlington is a 44 y.o. male who presents with schizophrenia.   Suicidal/Homicidal: Nowithout intent/plan   Plan: Return  again in 3 weeks.  Diagnosis: Axis I: schizophrenia      Dory Horn, LCSW 08/15/2019

## 2019-08-20 ENCOUNTER — Other Ambulatory Visit (HOSPITAL_COMMUNITY): Payer: Self-pay | Admitting: Psychiatry

## 2019-08-20 DIAGNOSIS — F200 Paranoid schizophrenia: Secondary | ICD-10-CM

## 2019-08-20 NOTE — Telephone Encounter (Signed)
Patient received first injectio of [place] on 07/31/2019. On next visit he will receive his maintenence dose of Invega Sustenna 156.

## 2019-08-30 ENCOUNTER — Ambulatory Visit (HOSPITAL_COMMUNITY): Payer: Medicaid Other | Admitting: *Deleted

## 2019-08-30 ENCOUNTER — Telehealth (HOSPITAL_COMMUNITY): Payer: Self-pay | Admitting: Psychiatry

## 2019-08-30 ENCOUNTER — Other Ambulatory Visit: Payer: Self-pay

## 2019-08-30 ENCOUNTER — Encounter (HOSPITAL_COMMUNITY): Payer: Self-pay

## 2019-08-30 VITALS — BP 108/78 | HR 106 | Wt 134.0 lb

## 2019-08-30 DIAGNOSIS — F200 Paranoid schizophrenia: Secondary | ICD-10-CM

## 2019-08-30 MED ORDER — PALIPERIDONE PALMITATE ER 156 MG/ML IM SUSY
156.0000 mg | PREFILLED_SYRINGE | INTRAMUSCULAR | Status: DC
  Administered 2019-08-30 – 2019-11-28 (×4): 156 mg via INTRAMUSCULAR

## 2019-08-30 NOTE — Telephone Encounter (Signed)
Pt presented as a walk-in to receive monthly IM [place] injection. IM Invega Sustenna 156 mg was administered today.

## 2019-08-30 NOTE — Progress Notes (Signed)
In for scheduled injection appt accompanied by his mom with whom he lives. He denies any psychotic sx though mom dominated the conversation with her hobby of making things out of paper. 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 He is now spending time watching TV and takes a walk with his mom most days. He is pleasant and appropriate. He requested his R deltoid muscle be used for his injection. He brought his Lorayne Bender S in as he has Medicaid. His only complaint is poor sleep even taking his ordered 25 mg Trazadone. States he only sleep a few hours each night and doesn't nap. To return in one month for next shot.

## 2019-09-06 ENCOUNTER — Ambulatory Visit (INDEPENDENT_AMBULATORY_CARE_PROVIDER_SITE_OTHER): Payer: Medicaid Other | Admitting: Licensed Clinical Social Worker

## 2019-09-06 ENCOUNTER — Other Ambulatory Visit: Payer: Self-pay

## 2019-09-06 DIAGNOSIS — F200 Paranoid schizophrenia: Secondary | ICD-10-CM | POA: Diagnosis not present

## 2019-09-06 NOTE — Progress Notes (Signed)
   THERAPIST PROGRESS NOTE  Session Time: 66  Participation Level: Active  Behavioral Response: CasualAlertAnxious  Type of Therapy: Individual Therapy  Treatment Goals addressed: Anxiety  Interventions: CBT and Supportive  Summary: Detavious Rinn is a 44 y.o. male who presents with schizophrenia  .   Suicidal/Homicidal: Nowithout intent/plan  Therapist Response:   Subjective/Objective: Pt was alert and oriented x 5. He was dressed casually and well groomed. He presents with anxious affect with consistent leg movement that pt was able to stop if asked too.   He reports that he is feeling overall "good". He recently got 1/3 stimulus checks in the male for $1400. He is now caught up on some of his bills that his mother was previously paying for him. He also bought some personal care items and clothing. Pt did report that he bought he daughter some clothes as well that her sent to her home in [place]. Issak has been talking with her nightly since he was realized from jail.  He currently has about $400 dollars left that he will save for upcoming bills.    Ella is now currently awaiting a MD appointment with an [place] certified doctor, he reports they will be calling in the upcoming weeks to setup an appointment to get income reinstated. Pt has updated he probation officer about this and she has been agreeable to plan to continue to stay with his mother if ankle monitor is being worn.   Assessment/Plan:  Pt endorses symptoms for psychosis for paranoia and hallucination that are prevalent at night. This has caused insomnia waking every 2 hours multiple times per night. Dex has been taking 50 mg of his trazadone which has continue to help pt fall asleep, but not stay asleep as he hears his name being called out while he sleeps. Pt continues to meet criteria for paranoid schizophrenia. Plan moving forward: Take medication as prescribed, report all symptoms to provider at next visit. Jacquelyn will  continue to use Alexa for reminders for daily hygiene and prepare his pill box weekly. Pt to bring at least 1 magic trick to present for next session in 2 weeks.    Plan: Return again in 2 weeks.  Diagnosis: Axis I: schizophrenia      Dory Horn, LCSW 09/06/2019

## 2019-09-20 ENCOUNTER — Other Ambulatory Visit: Payer: Self-pay

## 2019-09-20 ENCOUNTER — Other Ambulatory Visit (HOSPITAL_COMMUNITY): Payer: Self-pay | Admitting: Psychiatry

## 2019-09-20 ENCOUNTER — Ambulatory Visit (INDEPENDENT_AMBULATORY_CARE_PROVIDER_SITE_OTHER): Payer: Medicaid Other | Admitting: Licensed Clinical Social Worker

## 2019-09-20 ENCOUNTER — Telehealth (HOSPITAL_COMMUNITY): Payer: Self-pay | Admitting: *Deleted

## 2019-09-20 DIAGNOSIS — F200 Paranoid schizophrenia: Secondary | ICD-10-CM

## 2019-09-20 MED ORDER — TRAZODONE HCL 50 MG PO TABS: 50.0000 mg | ORAL_TABLET | Freq: Every day | ORAL | 2 refills | Status: DC

## 2019-09-20 NOTE — Telephone Encounter (Signed)
Had been having difficulty sleeping, currently had been taking 25 mg of Trazadone for sleep. Writer had suggested he try taking 50mg  and her reports a benefit with the increase. However, now he is running out of Trazadone due to the increase. Will consult Eulis Canner NP for a new RX to reflect his current use.

## 2019-09-20 NOTE — Progress Notes (Signed)
   THERAPIST PROGRESS NOTE  Session Time: 78   Therapist Response:   Subjective/Objective: Pt was alert and oriented x 5. He presented to session casually dress and engaged well with good eye contact. Titan had a euthymic mood/affect.   Pt started off session showing pictures of his daughter. He was very excited for her to turn 6. Jayton was excited because this year he could get her a present because he received a stimulus relief check 2 weeks ago. He did save $300 for future bills like his cell phone so his mother does not have to pay it. Pt bought new clothes for all his daughters' brother/sister even though he did not have too. Pt said that he spent within his budget. There is no current update on pt social security, they are supposed to call with an MD follow up appointment. Ronnell has been following regulations set for him through his probation and pt does see probation officer 2 x monthly. Pt and his mother will have a house guest Sept 14th, this is pt aunt. Overall pt reports he rates his symptoms a 5/10. He is drinking a lot of beverages per day and states he will drink 4 to 5 can of [facility] per day. LCSW did ask for pt to cut back as he continues to struggle with staying asleep at night. Pt is doing chores weekly dishes and feeding dog daily.  Assessment/Plan: Pt endorses symptoms for hallucinations and paranoia. He currently meets criteria for schizophrenia. Pt has been taking all medications on time and sets them up himself in a weekly pill box (day/night). He does reports insomnia waking up nightly because of the voices in his head although there has been an overall improvement in symptoms. Plan moving forward Pt to add 1 chore to his weekly least aside from feeding dog, taking out trash, and washing dishes. Kenry will also continue to develop at least 1 magic trick per week for session. Pt will also increase shower from 2 times weekly to 3 x weekly.      Participation Level:  Active  Behavioral Response: CasualAlertEuthymic  Type of Therapy: Individual Therapy  Treatment Goals addressed: Diagnosis: schizophrenia   Interventions: CBT and Social Skills Training  Summary: Daxten Kovalenko is a 44 y.o. male who presents with Schizophrenia.   Suicidal/Homicidal: Nowithout intent/plan  Plan: Return again in 10/04/2019   Diagnosis: Axis I: Chronic Paranoid Schizophrenia   Dory Horn, LCSW 09/20/2019

## 2019-09-20 NOTE — Telephone Encounter (Signed)
Medication sent to preferred pharmacy

## 2019-09-28 ENCOUNTER — Other Ambulatory Visit: Payer: Self-pay

## 2019-09-28 ENCOUNTER — Ambulatory Visit (HOSPITAL_COMMUNITY): Payer: Medicaid Other | Admitting: *Deleted

## 2019-09-28 ENCOUNTER — Encounter (HOSPITAL_COMMUNITY): Payer: Self-pay

## 2019-09-28 VITALS — BP 105/78 | HR 99 | Temp 98.1°F | Wt 134.0 lb

## 2019-09-28 DIAGNOSIS — F200 Paranoid schizophrenia: Secondary | ICD-10-CM

## 2019-09-28 NOTE — Progress Notes (Signed)
In for scheduled injection accompanied by his mom. He denies any sx or issues states he is doing well. From here they are going to the store for some items for moms craft projects which she brought an item she made for writer this am. He is talkative and appropriate, seems interested and proud of the crafts his mom works on. He denies any particular talent other than he used to enjoy magic but states hasnt been interested in it for sometime. Injection given in L deltoid of Invega 156 mg without issue. He is to return in one month for his next injection. He brings his injection with him as he has Medicaid.

## 2019-10-04 ENCOUNTER — Ambulatory Visit (INDEPENDENT_AMBULATORY_CARE_PROVIDER_SITE_OTHER): Payer: Medicaid Other | Admitting: Licensed Clinical Social Worker

## 2019-10-04 ENCOUNTER — Other Ambulatory Visit: Payer: Self-pay

## 2019-10-04 DIAGNOSIS — F200 Paranoid schizophrenia: Secondary | ICD-10-CM | POA: Diagnosis not present

## 2019-10-04 NOTE — Progress Notes (Signed)
   THERAPIST PROGRESS NOTE  Session Time: 105  Therapist Response:   Subjective/objective: Pt was alert and oriented x 5. He was dressed casually and present today with anxious affect/mood. Pt had restless leg throughout assessment that could be stopped if asked.   Pt reports primary stressors is insomnia. He reports that although he takes his trazadone at 10 PM he does not fall asleep until around midnight and wakes up 1 to 2 times after falling asleep. Pt agreeable to f/u with provider at his next appointment on 9/29 Due to these concerns. Isaiah Black also has an increase in auditory hallucination at night call his name repeatedly.   Isaiah Black has had an increase in his weekly ADL activity. He reports that he showers two times per week as well as brushes his teach and uses deodorant daily. He has also been doing daily chores as well when asked. LCSW intervened with using his Alexa to set reminders three times per day with the goal to try three different chores. Pt was agreeable to this plan. Isaiah Black is awaiting an income determination from his Psychologist, prison and probation services. Pt has been awaiting an appointment now for over 4 weeks with social security M.D. Isaiah Black to follow up with social security office 2 x weekly until an appointment is obtained.    Assessment/plan: Pt endorses symptoms for auditory hallucination, paranoia, tension and worry. Eugen continues to meet criteria for paranoid schizophrenia. He will be following up with medication provider 9/29 at that time pt to bring up insomnia symptoms to provider. Plan for therapy setting daily reminder 3 x per day with his Alexa to complete daily chores without mom having to intervene. Pt to write down these chores to present to LCSW in next session.    Participation Level: Active  Behavioral Response: CasualAlertAnxious  Type of Therapy: Individual Therapy  Treatment Goals addressed: Anxiety  Interventions: CBT and Supportive  Summary: Isaiah Black is a 44  y.o. male who presents with paranoid schizophrenia.   Suicidal/Homicidal: Nowithout intent/plan   Plan: Return again in 2 weeks.   Dory Horn, LCSW 10/04/2019

## 2019-10-18 ENCOUNTER — Other Ambulatory Visit: Payer: Self-pay

## 2019-10-18 ENCOUNTER — Ambulatory Visit (INDEPENDENT_AMBULATORY_CARE_PROVIDER_SITE_OTHER): Payer: Medicaid Other | Admitting: Licensed Clinical Social Worker

## 2019-10-18 DIAGNOSIS — F200 Paranoid schizophrenia: Secondary | ICD-10-CM | POA: Diagnosis not present

## 2019-10-18 NOTE — Progress Notes (Signed)
   THERAPIST PROGRESS NOTE  Session Time: 68   Therapist Response:   Subjective/objective: Pt was alert and oriented x 5. He was dressed casually. Pt was accompanied to appointment by his mother and aunt by due to space in LCSW office they waited in the lobby. Pt presented with anxious mood/affect but engaged well as evidence by note written below.   Pt reports primary stress has been financial and illness. He reports that Social security has will be sending over paperwork requesting records for pt treatment. Isaiah Black states that he already signed a release of information for them to do that. He needs to get clarification of where this was request sent records department or directly to [facility]. Pt is agreeable to follow up with that. Pt states he has been only showering 2 x weekly with the primary goal to be 3 x weekly. Isaiah Black has been brushing his teeth night and using deodorant in the mornings. This is a significant improvement as pt was not doing basic hygiene for ADLs at all when he first came in. Pt also has been feeding the dogs 2 x per day as well as doing the dishes nightly. These were primary goals for pt as he plans on living independently once an income can be established.    Assessment/plan: Pt endorses symptoms for anxiety and paranoid schizophrenia for paranoia, tension, worry, visual and auditory hallucinations. Pt see's shadows 2 x daily and hears  non command hallucination nightly which he uses the T.V to alleviate the noise. He also has insomnia getting around 4 to 6 hours of sleep per night but waking up 3 to 4 times per night and takes pt 2 hours to fall asleep after he takes his sleeping medication.  Participation Level: Active  Behavioral Response: Casual and Fairly GroomedAlertAnxious  Type of Therapy: Individual Therapy  Treatment Goals addressed: Anxiety  Interventions: Solution Focused  Summary: Isaiah Black is a 44 y.o. male who presents with Paranoid  Schizophrenia.   Suicidal/Homicidal: Nowithout intent/plan   Plan: Return again in 2 weeks.   Dory Horn, LCSW 10/18/2019

## 2019-10-29 ENCOUNTER — Other Ambulatory Visit: Payer: Self-pay

## 2019-10-29 ENCOUNTER — Ambulatory Visit (HOSPITAL_COMMUNITY): Payer: Medicaid Other | Admitting: *Deleted

## 2019-10-29 ENCOUNTER — Encounter (HOSPITAL_COMMUNITY): Payer: Self-pay

## 2019-10-29 VITALS — BP 104/80 | HR 102 | Temp 98.3°F | Ht 66.0 in | Wt 132.0 lb

## 2019-10-29 DIAGNOSIS — F200 Paranoid schizophrenia: Secondary | ICD-10-CM

## 2019-10-29 NOTE — Progress Notes (Signed)
In as scheduled for his Invega injection. He got Invega Injection 156 mg as ordered in L deltoid. He is accompanied by his mom and his aunt. His aunt is visiting for one month from [place]. All say they are having a good time. He has a bright affect, pleasant and offers no complaints. He denies any sx of psychosis. After getting his shot today he is going with his family to [place] and [place]. Mom has a hobby of making things with foam and paper and is always sourcing items for her passion. To return in one month for next injection on 10/27.

## 2019-10-31 ENCOUNTER — Encounter (HOSPITAL_COMMUNITY): Payer: Self-pay | Admitting: Psychiatry

## 2019-10-31 ENCOUNTER — Other Ambulatory Visit: Payer: Self-pay

## 2019-10-31 ENCOUNTER — Ambulatory Visit (INDEPENDENT_AMBULATORY_CARE_PROVIDER_SITE_OTHER): Payer: Medicaid Other | Admitting: Psychiatry

## 2019-10-31 DIAGNOSIS — F200 Paranoid schizophrenia: Secondary | ICD-10-CM | POA: Diagnosis not present

## 2019-10-31 MED ORDER — TRAZODONE HCL 100 MG PO TABS: 100.0000 mg | ORAL_TABLET | Freq: Every evening | ORAL | 2 refills | Status: DC | PRN

## 2019-10-31 MED ORDER — BENZTROPINE MESYLATE 1 MG PO TABS: 1.0000 mg | ORAL_TABLET | Freq: Two times a day (BID) | ORAL | 2 refills | Status: DC

## 2019-10-31 MED ORDER — INVEGA SUSTENNA 156 MG/ML IM SUSY: 156.0000 mg | PREFILLED_SYRINGE | INTRAMUSCULAR | 11 refills | Status: DC

## 2019-10-31 MED ORDER — CITALOPRAM HYDROBROMIDE 20 MG PO TABS: 20.0000 mg | ORAL_TABLET | Freq: Every day | ORAL | 2 refills | Status: DC

## 2019-10-31 NOTE — Progress Notes (Signed)
BH MD/PA/NP OP Progress Note  10/31/2019 8:57 AM Isaiah Black  MRN:  [id number]  Chief Complaint: "Sleep is better but I still wake up four times a night" Chief Complaint    Medication Management      HPI: 46 old male seen today for follow-up psychiatric evaluation.  He has a psychiatric history of paranoid schizophrenia.  He is currently being managed on Cogentin 1 mg twice daily, and Celexa 20 mg daily, invega 156 monthly, and trazodone 25 mg nightly . Patient notes that overall he is doing well however notes that his sleep is disturbed.  Today he is well groomed, pleasant, cooperative, engaged in conversation, and maintained eye contact. He denies symptoms of depression, anxiety, SI/HI/VH. He notes that his auditory hallucinations have improved however notes that at times he hears muffles in the background. Patient also informed providerthat he is having problems sleeping, noteing that he gets  four times a night. He states that he began taking Trazodone 50 mg instead of 25 mg and notes that that was somewhat effective however notes he still has disturbed sleep and is also tired daily.    Patient informed Probation officer that he is proud of his daughter who recently started kidergarten. He notes that he enjoys speaking with her on the phone. He is agreeable to increase trazodone 50 mg to 100 mg to help manage sleep.  He will continue all other medications as prescribed.  No other concerns noted at this time.  visit Diagnosis:    ICD-10-CM   1. Paranoid schizophrenia ([place])  F20.0 benztropine (COGENTIN) 1 MG tablet    citalopram (CELEXA) 20 MG tablet    paliperidone (INVEGA SUSTENNA) 156 MG/ML SUSY injection    traZODone (DESYREL) 100 MG tablet    Past Psychiatric History: paranoid schizophrenia, anxiety, and disassociation disorder.   Past Medical History:  Past Medical History:  Diagnosis Date  . Anxiety   . Disassociation disorder   . Paranoid schizophrenia ([place])    No past surgical  history on file.  Family Psychiatric History: Paternal uncle schizophrenia, Father Bipolar 1, Maternal aunt Bipolar 1, Niece depression    Family History: No family history on file.  Social History:  Social History   Socioeconomic History  . Marital status: Single    Spouse name: Not on file  . Number of children: Not on file  . Years of education: Not on file  . Highest education level: Not on file  Occupational History  . Not on file  Tobacco Use  . Smoking status: Former Research scientist (life sciences)  . Smokeless tobacco: Never Used  . Tobacco comment: vape  Vaping Use  . Vaping Use: Every day  Substance and Sexual Activity  . Alcohol use: Yes    Comment: occ  . Drug use: Not Currently    Types: Marijuana    Comment: Pt stated "I was vaping the pens"  . Sexual activity: Yes  Other Topics Concern  . Not on file  Social History Narrative  . Not on file   Social Determinants of Health   Financial Resource Strain: High Risk  . Difficulty of Paying Living Expenses: Very hard  Food Insecurity:   . Worried About Charity fundraiser in the Last Year: Not on file  . Ran Out of Food in the Last Year: Not on file  Transportation Needs:   . Lack of Transportation (Medical): Not on file  . Lack of Transportation (Non-Medical): Not on file  Physical Activity:   . Days  of Exercise per Week: Not on file  . Minutes of Exercise per Session: Not on file  Stress:   . Feeling of Stress : Not on file  Social Connections:   . Frequency of Communication with Friends and Family: Not on file  . Frequency of Social Gatherings with Friends and Family: Not on file  . Attends Religious Services: Not on file  . Active Member of Clubs or Organizations: Not on file  . Attends Archivist Meetings: Not on file  . Marital Status: Not on file    Allergies: No Known Allergies  Metabolic Disorder Labs: Lab Results  Component Value Date   HGBA1C 4.6 (L) 03/07/2019   MPG 85.32 03/07/2019   MPG 99.67  07/08/2017   Lab Results  Component Value Date   PROLACTIN 100.0 (H) 03/07/2019   PROLACTIN 51.1 (H) 07/08/2017   Lab Results  Component Value Date   CHOL 132 03/07/2019   TRIG 48 03/07/2019   HDL 42 03/07/2019   CHOLHDL 3.1 03/07/2019   VLDL 10 03/07/2019   LDLCALC 80 03/07/2019   LDLCALC 109 (H) 07/08/2017   Lab Results  Component Value Date   TSH 5.024 (H) 03/07/2019   TSH 6.137 (H) 07/08/2017    Therapeutic Level Labs: No results found for: LITHIUM No results found for: VALPROATE No components found for:  CBMZ  Current Medications: Current Outpatient Medications  Medication Sig Dispense Refill  . albuterol (VENTOLIN HFA) 108 (90 Base) MCG/ACT inhaler Inhale 2 puffs into the lungs every 6 (six) hours as needed for wheezing or shortness of breath.    . benztropine (COGENTIN) 1 MG tablet Take 1 tablet (1 mg total) by mouth 2 (two) times daily. 60 tablet 2  . citalopram (CELEXA) 20 MG tablet Take 1 tablet (20 mg total) by mouth daily. 30 tablet 2  . traZODone (DESYREL) 100 MG tablet Take 1 tablet (100 mg total) by mouth at bedtime as needed for sleep. 30 tablet 2  . paliperidone (INVEGA SUSTENNA) 156 MG/ML SUSY injection Inject 1 mL (156 mg total) into the muscle every 30 (thirty) days. 1 mL 11   Current Facility-Administered Medications  Medication Dose Route Frequency Provider Last Rate Last Admin  . paliperidone (INVEGA SUSTENNA) injection 156 mg  156 mg Intramuscular Q30 days [place] Crane, MD   156 mg at 10/29/19 2202     Musculoskeletal: Strength & Muscle Tone: within normal limits Gait & Station: normal Patient leans: N/A  Psychiatric Specialty Exam: Review of Systems  Blood pressure 111/87, pulse 96, height 5\' 6"  (1.676 m), weight 132 lb (59.9 kg), SpO2 96 %.Body mass index is 21.31 kg/m.  General Appearance: Well Groomed  Eye Contact:  Good  Speech:  Clear and Coherent and Normal Rate  Volume:  Normal  Mood:  Euthymic  Affect:  Congruent  Thought  Process:  Coherent, Goal Directed and Linear  Orientation:  Full (Time, Place, and Person)  Thought Content: Logical and Hallucinations: Auditory   Suicidal Thoughts:  No  Homicidal Thoughts:  No  Memory:  Immediate;   Good Recent;   Good Remote;   Good  Judgement:  Good  Insight:  Good  Psychomotor Activity:  Normal  Concentration:  Concentration: Good and Attention Span: Good  Recall:  Good  Fund of Knowledge: Good  Language: Good  Akathisia:  No  Handed:  Right  AIMS (if indicated): done  Assets:  Communication Skills Desire for Improvement Financial Resources/Insurance Housing Social Support  ADL's:  Intact  Cognition: WNL  Sleep:  Poor   Screenings: AIMS     Clinical Support from 10/31/2019 in [facility] Admission (Discharged) from 07/06/2017 in [place] [facility]  AIMS Total Score 5 0    AUDIT     Admission (Discharged) from 03/06/2019 in [place] Admission (Discharged) from 07/06/2017 in [place] [facility]  Alcohol Use Disorder Identification Test Final Score (AUDIT) 25 2    CAGE-AID     Counselor from 07/03/2019 in [facility]  CAGE-AID Score 0       Assessment and Plan: Patient notes that overall the Invega was tolerable with no notes side effects and denies anxiety and depression. He informed Probation officer that his AH are improving however notes at times he hears muffles in the [place]. He notes that his sleeping has improved but notes that he still wakes up 4 times a night and have problems getting back to sleep. He informed Probation officer that he has been taking trazodone 50 mg and is agreeable to increase it to 100 mg to help manage sleep. He will continue all other medications as prescribed  1. Paranoid schizophrenia ([place])  Continue- benztropine (COGENTIN) 1 MG tablet; Take 1 tablet (1 mg total) by mouth 2 (two) times daily.  Dispense: 60  tablet; Refill: 2 Continue- citalopram (CELEXA) 20 MG tablet; Take 1 tablet (20 mg total) by mouth daily.  Dispense: 30 tablet; Refill: 2 Continue- paliperidone (INVEGA SUSTENNA) 156 MG/ML SUSY injection; Inject 1 mL (156 mg total) into the muscle every 30 (thirty) days.  Dispense: 1 mL; Refill: 11 Continue- traZODone (DESYREL) 100 MG tablet; Take 1 tablet (100 mg total) by mouth at bedtime as needed for sleep.  Dispense: 30 tablet; Refill: 2   Follow-up in 3 months.    Salley Slaughter, NP 10/31/2019, 8:57 AM

## 2019-11-01 ENCOUNTER — Ambulatory Visit (INDEPENDENT_AMBULATORY_CARE_PROVIDER_SITE_OTHER): Payer: Medicaid Other | Admitting: Licensed Clinical Social Worker

## 2019-11-01 DIAGNOSIS — F200 Paranoid schizophrenia: Secondary | ICD-10-CM

## 2019-11-01 NOTE — Progress Notes (Signed)
° °  THERAPIST PROGRESS NOTE  Session Time: 53  Therapist Response:    subjective/objective:  Pt was alert and oriented x 5. He was dressed casually and fairly groomed. He presented today with anxious & euthymic mood/affect. He was cooperative with restless legs and fidgeting with his hands which he was able to stop on command.   Mearl states that everything has been going well. He missed a call on Monday from Social security office. They want to set him up a zoom appointment to see one of their providers. He has been trying to get in contact with them for the past two days but not had any success. Pt will attempt again today at 1 PM.   He reports success with chores. Mother who was also in session stated that only thing she wants him to do more is change his socks daily. LCSW provided the goal of changing socks 5/7 days per week which pt was agreeable too. He also is to continue to attempt to increase showers from 2 x per week to 3 x per week.     Assessment/Plan:  Pt endorses symptoms for insomnia, auditory hallucinations, paranoia, and tension. Currently meet criteria for paranoid schizophrenia. He has been taking all medications as prescribed. Plan moving forward continue with already worked on chores and ADLs. Pt will increase showering to 3 x per week, f/u with social security office to get an appointment with one of their providers and change socks 5/7 days per week.  Participation Level: Active  Behavioral Response: Casual and Fairly GroomedAlertAnxious  Type of Therapy: Individual Therapy  Treatment Goals addressed: Diagnosis: paranoid schziophrenia   Interventions: Solution Focused and Supportive  Summary: Isaiah Black is a 44 y.o. male who presents with paranoid schizophrenia.   Suicidal/Homicidal: Nowithout intent/plan  Plan: Return again in 2 weeks.      Dory Horn, LCSW 11/01/2019

## 2019-11-15 ENCOUNTER — Other Ambulatory Visit: Payer: Self-pay

## 2019-11-15 ENCOUNTER — Ambulatory Visit (INDEPENDENT_AMBULATORY_CARE_PROVIDER_SITE_OTHER): Payer: Medicaid Other | Admitting: Licensed Clinical Social Worker

## 2019-11-15 DIAGNOSIS — F200 Paranoid schizophrenia: Secondary | ICD-10-CM | POA: Diagnosis not present

## 2019-11-15 NOTE — Progress Notes (Signed)
   THERAPIST PROGRESS NOTE  Session Time: 66  Therapist Response:    Subjective/Objective: Pt was alert and oriented x 5. He was dressed casually and fairly groomed. Pt engaged well as evidence by note below, he was cooperative and maintained good eye contact. Presented today with restless leg and flat affect.   Pt reports primary stressor as financials. He has been moving closer to receiving a determination on his social security disability. Pt states that he has an appointment next month to see one of their providers. Charon states "I am nervous". He knows that this is the last step to getting housing of his own. He currently lives with his mother who is his primary support system. Pt should not be there because it violates his probation the house is too close to a school. Thelbert also reports that the house his mother lives in is section 8 housing and if he is not listed on the lease, he is not supposed to be there either. His probation officer is aware of the situation as evidence by the ankle monitor pt is wearing. Once an income can be verified there are resources ready for pt to obtain housing.   Pt has been decreasing his personal hygiene. He reports this past week he did not shower once in a week. This is because he has had family in town and pt reports things got "hectic". LCSW explained the need for good hygiene not only for now, but more so when he gets his own place and does not have someone to remind him. Goal remains to shower 3 times per week.      Assessment/plan: Pt endorse symptoms for AH, insomnia (staying asleep), paranoia (in social setting). Symptoms have decreased sense starting medications but are still present. He still meets criteria for paranoid schizophrenia. Maclovio has been taking medications as prescribed. Plan for therapy: Pt to set reminder with Alexa for shower 3 x per week, this will also help pt be reminded to change his socks as well. He will also attend 1 peer support  group at [facility].    Participation Level: Active  Behavioral Response: Casual and Fairly GroomedAlertAnxious  Type of Therapy: Individual Therapy  Treatment Goals addressed: Diagnosis: paranoid schziophrenia  Interventions: CBT and Solution Focused  Summary: Ashe Gago is a 44 y.o. male who presents with paranoid schizophrenia .   Suicidal/Homicidal: Nowithout intent/plan  Plan: Return again in 4 weeks.     Dory Horn, LCSW 11/15/2019

## 2019-11-28 ENCOUNTER — Inpatient Hospital Stay (HOSPITAL_COMMUNITY): Payer: Medicaid Other

## 2019-11-28 ENCOUNTER — Emergency Department (HOSPITAL_COMMUNITY): Payer: Medicaid Other

## 2019-11-28 ENCOUNTER — Telehealth (HOSPITAL_COMMUNITY): Payer: Self-pay | Admitting: *Deleted

## 2019-11-28 ENCOUNTER — Other Ambulatory Visit: Payer: Self-pay

## 2019-11-28 ENCOUNTER — Inpatient Hospital Stay (HOSPITAL_COMMUNITY)
Admission: EM | Admit: 2019-11-28 | Discharge: 2019-11-30 | DRG: 296 | Disposition: A | Discharge status: Home / Self Care | Payer: Medicaid Other | Attending: Internal Medicine | Admitting: Internal Medicine

## 2019-11-28 ENCOUNTER — Ambulatory Visit (HOSPITAL_COMMUNITY): Payer: Medicaid Other | Admitting: *Deleted

## 2019-11-28 ENCOUNTER — Encounter (HOSPITAL_COMMUNITY): Payer: Self-pay

## 2019-11-28 VITALS — BP 127/72 | HR 104 | Resp 98 | Ht 66.0 in | Wt 129.5 lb

## 2019-11-28 DIAGNOSIS — Z833 Family history of diabetes mellitus: Secondary | ICD-10-CM

## 2019-11-28 DIAGNOSIS — J690 Pneumonitis due to inhalation of food and vomit: Secondary | ICD-10-CM | POA: Diagnosis present

## 2019-11-28 DIAGNOSIS — I493 Ventricular premature depolarization: Secondary | ICD-10-CM | POA: Diagnosis not present

## 2019-11-28 DIAGNOSIS — T43595A Adverse effect of other antipsychotics and neuroleptics, initial encounter: Secondary | ICD-10-CM | POA: Diagnosis present

## 2019-11-28 DIAGNOSIS — J90 Pleural effusion, not elsewhere classified: Secondary | ICD-10-CM | POA: Diagnosis present

## 2019-11-28 DIAGNOSIS — J9601 Acute respiratory failure with hypoxia: Secondary | ICD-10-CM | POA: Diagnosis not present

## 2019-11-28 DIAGNOSIS — Z823 Family history of stroke: Secondary | ICD-10-CM

## 2019-11-28 DIAGNOSIS — Z8241 Family history of sudden cardiac death: Secondary | ICD-10-CM

## 2019-11-28 DIAGNOSIS — R471 Dysarthria and anarthria: Secondary | ICD-10-CM | POA: Diagnosis present

## 2019-11-28 DIAGNOSIS — R092 Respiratory arrest: Secondary | ICD-10-CM

## 2019-11-28 DIAGNOSIS — F200 Paranoid schizophrenia: Secondary | ICD-10-CM | POA: Diagnosis present

## 2019-11-28 DIAGNOSIS — Y848 Other medical procedures as the cause of abnormal reaction of the patient, or of later complication, without mention of misadventure at the time of the procedure: Secondary | ICD-10-CM | POA: Diagnosis present

## 2019-11-28 DIAGNOSIS — I469 Cardiac arrest, cause unspecified: Principal | ICD-10-CM

## 2019-11-28 DIAGNOSIS — R000 Tachycardia, unspecified: Secondary | ICD-10-CM | POA: Diagnosis not present

## 2019-11-28 DIAGNOSIS — Z809 Family history of malignant neoplasm, unspecified: Secondary | ICD-10-CM | POA: Diagnosis not present

## 2019-11-28 DIAGNOSIS — R0902 Hypoxemia: Secondary | ICD-10-CM | POA: Diagnosis present

## 2019-11-28 DIAGNOSIS — Z79899 Other long term (current) drug therapy: Secondary | ICD-10-CM

## 2019-11-28 DIAGNOSIS — J45909 Unspecified asthma, uncomplicated: Secondary | ICD-10-CM | POA: Diagnosis present

## 2019-11-28 DIAGNOSIS — F419 Anxiety disorder, unspecified: Secondary | ICD-10-CM | POA: Diagnosis present

## 2019-11-28 DIAGNOSIS — M9689 Other intraoperative and postprocedural complications and disorders of the musculoskeletal system: Secondary | ICD-10-CM | POA: Diagnosis present

## 2019-11-28 DIAGNOSIS — Z87891 Personal history of nicotine dependence: Secondary | ICD-10-CM | POA: Diagnosis not present

## 2019-11-28 DIAGNOSIS — Z20822 Contact with and (suspected) exposure to covid-19: Secondary | ICD-10-CM | POA: Diagnosis present

## 2019-11-28 LAB — URINALYSIS, ROUTINE W REFLEX MICROSCOPIC
Bilirubin Urine: NEGATIVE
Glucose, UA: NEGATIVE mg/dL
Hgb urine dipstick: NEGATIVE
Ketones, ur: 20 mg/dL — AB
Leukocytes,Ua: NEGATIVE
Nitrite: NEGATIVE
Protein, ur: NEGATIVE mg/dL
Specific Gravity, Urine: 1.041 — ABNORMAL HIGH (ref 1.005–1.030)
pH: 6 (ref 5.0–8.0)

## 2019-11-28 LAB — CBC WITH DIFFERENTIAL/PLATELET
Abs Immature Granulocytes: 0.14 10*3/uL — ABNORMAL HIGH (ref 0.00–0.07)
Basophils Absolute: 0.1 10*3/uL (ref 0.0–0.1)
Basophils Relative: 1 %
Eosinophils Absolute: 0.2 10*3/uL (ref 0.0–0.5)
Eosinophils Relative: 2 %
HCT: 40.6 % (ref 39.0–52.0)
Hemoglobin: 13.2 g/dL (ref 13.0–17.0)
Immature Granulocytes: 1 %
Lymphocytes Relative: 7 %
Lymphs Abs: 0.9 10*3/uL (ref 0.7–4.0)
MCH: 29.9 pg (ref 26.0–34.0)
MCHC: 32.5 g/dL (ref 30.0–36.0)
MCV: 92.1 fL (ref 80.0–100.0)
Monocytes Absolute: 0.5 10*3/uL (ref 0.1–1.0)
Monocytes Relative: 4 %
Neutro Abs: 10.7 10*3/uL — ABNORMAL HIGH (ref 1.7–7.7)
Neutrophils Relative %: 85 %
Platelets: 235 10*3/uL (ref 150–400)
RBC: 4.41 MIL/uL (ref 4.22–5.81)
RDW: 11.9 % (ref 11.5–15.5)
WBC: 12.4 10*3/uL — ABNORMAL HIGH (ref 4.0–10.5)
nRBC: 0 % (ref 0.0–0.2)

## 2019-11-28 LAB — I-STAT CHEM 8, ED
BUN: 16 mg/dL (ref 6–20)
Calcium, Ion: 1 mmol/L — ABNORMAL LOW (ref 1.15–1.40)
Chloride: 105 mmol/L (ref 98–111)
Creatinine, Ser: 0.9 mg/dL (ref 0.61–1.24)
Glucose, Bld: 154 mg/dL — ABNORMAL HIGH (ref 70–99)
HCT: 36 % — ABNORMAL LOW (ref 39.0–52.0)
Hemoglobin: 12.2 g/dL — ABNORMAL LOW (ref 13.0–17.0)
Potassium: 3.6 mmol/L (ref 3.5–5.1)
Sodium: 143 mmol/L (ref 135–145)
TCO2: 21 mmol/L — ABNORMAL LOW (ref 22–32)

## 2019-11-28 LAB — COMPREHENSIVE METABOLIC PANEL
ALT: 14 U/L (ref 0–44)
AST: 21 U/L (ref 15–41)
Albumin: 3.2 g/dL — ABNORMAL LOW (ref 3.5–5.0)
Alkaline Phosphatase: 100 U/L (ref 38–126)
Anion gap: 12 (ref 5–15)
BUN: 17 mg/dL (ref 6–20)
CO2: 21 mmol/L — ABNORMAL LOW (ref 22–32)
Calcium: 8.1 mg/dL — ABNORMAL LOW (ref 8.9–10.3)
Chloride: 106 mmol/L (ref 98–111)
Creatinine, Ser: 1.11 mg/dL (ref 0.61–1.24)
GFR, Estimated: >60 mL/min (ref >60)
Glucose, Bld: 172 mg/dL — ABNORMAL HIGH (ref 70–99)
Potassium: 3.9 mmol/L (ref 3.5–5.1)
Sodium: 139 mmol/L (ref 135–145)
Total Bilirubin: 0.8 mg/dL (ref 0.3–1.2)
Total Protein: 5.7 g/dL — ABNORMAL LOW (ref 6.5–8.1)

## 2019-11-28 LAB — RAPID URINE DRUG SCREEN, HOSP PERFORMED
Amphetamines: NOT DETECTED
Barbiturates: NOT DETECTED
Benzodiazepines: NOT DETECTED
Cocaine: NOT DETECTED
Opiates: NOT DETECTED
Tetrahydrocannabinol: NOT DETECTED

## 2019-11-28 LAB — CBG MONITORING, ED: Glucose-Capillary: 164 mg/dL — ABNORMAL HIGH (ref 70–99)

## 2019-11-28 LAB — RESPIRATORY PANEL BY RT PCR (FLU A&B, COVID)
Influenza A by PCR: NEGATIVE
Influenza B by PCR: NEGATIVE
SARS Coronavirus 2 by RT PCR: NEGATIVE

## 2019-11-28 LAB — TROPONIN I (HIGH SENSITIVITY)
Troponin I (High Sensitivity): 7 ng/L (ref <18)
Troponin I (High Sensitivity): 9 ng/L (ref <18)

## 2019-11-28 LAB — MAGNESIUM: Magnesium: 2.3 mg/dL (ref 1.7–2.4)

## 2019-11-28 LAB — HIV ANTIBODY (ROUTINE TESTING W REFLEX): HIV Screen 4th Generation wRfx: NONREACTIVE

## 2019-11-28 MED ORDER — SODIUM CHLORIDE 0.9 % IV SOLN
3.0000 g | Freq: Once | INTRAVENOUS | Status: AC
  Administered 2019-11-28: 3 g via INTRAVENOUS
  Filled 2019-11-28: qty 8

## 2019-11-28 MED ORDER — LIDOCAINE 5 % EX PTCH
1.0000 | MEDICATED_PATCH | CUTANEOUS | Status: DC
  Administered 2019-11-28 – 2019-11-29 (×2): 1 via TRANSDERMAL
  Filled 2019-11-28 (×2): qty 1

## 2019-11-28 MED ORDER — SODIUM CHLORIDE 0.9 % IV SOLN: INTRAVENOUS | Status: DC

## 2019-11-28 MED ORDER — SODIUM CHLORIDE 0.9 % IV SOLN: INTRAVENOUS | Status: AC

## 2019-11-28 MED ORDER — SODIUM CHLORIDE 0.9 % IV BOLUS
1000.0000 mL | Freq: Once | INTRAVENOUS | Status: AC
  Administered 2019-11-28: 1000 mL via INTRAVENOUS

## 2019-11-28 MED ORDER — POTASSIUM CHLORIDE 20 MEQ PO PACK
40.0000 meq | PACK | Freq: Once | ORAL | Status: AC
  Administered 2019-11-28: 40 meq via ORAL
  Filled 2019-11-28: qty 2

## 2019-11-28 MED ORDER — IOHEXOL 350 MG/ML SOLN
65.0000 mL | Freq: Once | INTRAVENOUS | Status: AC | PRN
  Administered 2019-11-28: 65 mL via INTRAVENOUS

## 2019-11-28 MED ORDER — ENOXAPARIN SODIUM 40 MG/0.4ML ~~LOC~~ SOLN
40.0000 mg | SUBCUTANEOUS | Status: DC
  Administered 2019-11-28 – 2019-11-29 (×2): 40 mg via SUBCUTANEOUS
  Filled 2019-11-28 (×2): qty 0.4

## 2019-11-28 MED ORDER — ACETAMINOPHEN 325 MG PO TABS
650.0000 mg | ORAL_TABLET | Freq: Four times a day (QID) | ORAL | Status: DC | PRN
  Administered 2019-11-28: 650 mg via ORAL
  Filled 2019-11-28: qty 2

## 2019-11-28 MED ORDER — ACETAMINOPHEN 650 MG RE SUPP: 650.0000 mg | Freq: Four times a day (QID) | RECTAL | Status: DC | PRN

## 2019-11-28 MED ORDER — ONDANSETRON HCL 4 MG/2ML IJ SOLN
4.0000 mg | Freq: Once | INTRAMUSCULAR | Status: AC
  Administered 2019-11-28: 4 mg via INTRAVENOUS
  Filled 2019-11-28: qty 2

## 2019-11-28 NOTE — ED Notes (Signed)
Pt dropped to 86% on room air, 4L [place] applied, sats at 91-92%

## 2019-11-28 NOTE — H&P (Addendum)
Date: 11/28/2019               Patient Name:  Isaiah Black MRN: [id number]  DOB: 08/17/1975 Age / Sex: 44 y.o., male   PCP: Patient, No Pcp Per         Medical Service: Internal Medicine Teaching Service         Attending Physician: Dr. Sid Falcon, MD    First Contact: Dr. Johnney Ou Pager: [phone]  Second Contact: Dr. Laural Golden Pager: [phone]       After Hours (After 5p/  First Contact Pager: [phone]  weekends / holidays): Second Contact Pager: [phone]   Chief Complaint: Cardiac arrest  History of Present Illness: 44 y/o male with PMHx significant for schizophrenia, presented after reported to heve cardiac arrest at psych clinic. Patient was at his psychiatrist's office for an [place] injection. He has been on this medication for several months without issue but today, and few minutes after injection, he reported being "dizzy" and expressed that everything is spinning. He then became unresponsive and per his mom who was there in clinic with him, his hands and lips became blue. She states that he did not have any seizure live activity. Reportedly he found to be pulseless, apneic and hypoxic per psych clinic staff evaluation. He received 5 minutes of CPR and had AED placed. He had ROSC after 5 min and by EMS arrival but was not responsive. He does not remember anything between the time he felt dizzy and until he woke up in ED. He denies chest pain, SOB, focal weakness or other symptoms before this event. He lives with his mother and except mild cough and congestion in past few days, was at his baseline before this episode happened. He mentions that he did not have prior similar event with or without receiving Invega. He reports that he feels having slurred speech after woke up in ED and his arms are numb. Otherwise no major complaint.  ED course: He was awake but confused. O2 sat 86%. Improved to 91-98% with 4 li [place]. Tachycardic at 120, BP 127/98. EKG unremarkable. CT head w/o acute  finding. CT spine w/o Fx or injury. CTA chest w/o PE but showed probable small rt side pleural effusion.  Meds:  Current Facility-Administered Medications for the 11/28/19 encounter [facility] Encounter)  Medication  . paliperidone (INVEGA SUSTENNA) injection 156 mg   Current Meds  Medication Sig  . albuterol (VENTOLIN HFA) 108 (90 Base) MCG/ACT inhaler Inhale 2 puffs into the lungs every 6 (six) hours as needed for wheezing or shortness of breath.  . benztropine (COGENTIN) 1 MG tablet Take 1 tablet (1 mg total) by mouth 2 (two) times daily.  . citalopram (CELEXA) 20 MG tablet Take 1 tablet (20 mg total) by mouth daily. (Patient taking differently: Take 20 mg by mouth daily after supper. )  . paliperidone (INVEGA SUSTENNA) 156 MG/ML SUSY injection Inject 1 mL (156 mg total) into the muscle every 30 (thirty) days.  . traZODone (DESYREL) 100 MG tablet Take 1 tablet (100 mg total) by mouth at bedtime as needed for sleep. (Patient taking differently: Take 100 mg by mouth at bedtime. )     Allergies: Allergies as of 11/28/2019  . (No Known Allergies)   Past Medical History:  Diagnosis Date  . Anxiety   . Disassociation disorder   . Paranoid schizophrenia ([place])     Family History:   Social History: Lives with his mother since released from the jail,  No smoking, no illicit drug use, no alcohol use  Review of Systems: A complete ROS was negative except as per HPI.   Physical Exam: Blood pressure 114/82, pulse 94, temperature (!) 97 F (36.1 C), temperature source Oral, resp. rate 18, height 5\' 6"  (1.676 m), weight 58.7 kg, SpO2 95 %. Physical Exam Constitutional:      General: He is not in acute distress.    Appearance: Normal appearance. He is not ill-appearing.  HENT:     Head: Normocephalic and atraumatic.     Mouth/Throat:     Mouth: Mucous membranes are dry.  Eyes:     Extraocular Movements: Extraocular movements intact.     Conjunctiva/sclera: Conjunctivae normal.      Pupils: Pupils are equal, round, and reactive to light.  Cardiovascular:     Rate and Rhythm: Normal rate and regular rhythm.     Pulses: Normal pulses.     Heart sounds: Normal heart sounds. No murmur heard.   Pulmonary:     Effort: Pulmonary effort is normal. No respiratory distress.     Breath sounds: Normal breath sounds. No wheezing or rales.  Abdominal:     General: Bowel sounds are normal.     Palpations: Abdomen is soft.     Tenderness: There is no abdominal tenderness.  Skin:    General: Skin is dry.  Neurological:     General: No focal deficit present.     Mental Status: He is alert and oriented to person, place, and time.     Cranial Nerves: No cranial nerve deficit.     Sensory: No sensory deficit.     Motor: No weakness.     Comments: Slight slurred speech  Psychiatric:        Mood and Affect: Mood normal.        Behavior: Behavior normal.        Thought Content: Thought content normal.        Judgment: Judgment normal.     EKG: personally reviewed my interpretation is NSR. No acute ST-T changes. No QTC prolongation.  CXR: personally reviewed my interpretation is mild haziness at lung bases R>L (Probable right pleural effusion?)   CT head: 1. No evidence of acute intracranial abnormality. 2. Paranasal sinus disease, which is nonspecific but can be seen with sinusitis.   CT cervical spine:   1. No evidence of acute fracture or traumatic malalignment. 2. 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 Resulting lower cervical levocurvature.   CT chest angio:   IMPRESSION: 1. Negative for pulmonary embolus. 2. Right lower lobe ground-glass may be due to pneumonia. Associated moderate right pleural effusion. 3. Borderline enlarged right hilar lymph node, likely reactive.  Assessment & Plan by Problem: Active Problems:   Cardiac arrest  [facility])  Cardiac arrest/syncopal episode: Likely in setting of Invega administration Reported to have cardiac arrest after receiving INVEGA (paliperidone). Reportedly was pulsless and hypoxic then apneic. Received CPR in psychiatry office, had ROSC after 5 minutes and upon EMS arrival he had pulses but was in respiratory distress. When evaluated in ED, he was more awake and complaining of weakness and numbness of hands and arms and a sensation of slurring his words.  He did not have any focal neurologic deficit that was noted by ED provider.  His case was discussed with neurology that believe the symptoms might be a sign of hypoperfusion and recommended brain  MRI.  Reported sides effect of Second generation antipsychotic are orthostatic hypotension, prolonged QTC and sudden death, myocarditis and CMP, seizure, PE.  EKG did not show QT prolongation. No seizure like activity reported in pt during the event per mother. No PE on CT A today.Troponin is negative 7. Second Trop is pending. COVID test negative. Mild leukocytosis 12.4, w 10.7 % neut.  Of note, he has FHx of premature cardiac death. 2 of his uncles collapsed and passed away suddenly when walking. One at 61s and the other one at age 68s. His grandfather also dies suddenly when was taking a shower.  Patient does not have prior echo.   Will place on cardiac tele. Will avoid QT prolonging meds. Will replace electrolytes as needed. Will obtain echo.  PCCM did not recommend ICU admission post probable cardiac arrest.  -Admitting in progressive unit with cardiac tele -Cardiac monitoring -Holding psych meds today -Avoid QTC prolonging meds -UDS and UA are pending -Consulted PCCM given the event. No indication for ICU admission per PCCm team, Dr. Tacy Learn.  -Given FHx of sudden cardiac death (uncle at age 84s, another uncle at 84s), and this episode, may consider evaluation by cardiology here or outpatient -Stop Invega. Consider adjusting psych  meds at discharge and minimize QTs prolonging meds as much as possible until further evaluation by cards. -Will obtain Echo. (Evaluate for CMP and also HOCM given FHx of sudden cardiac death) -EKG tomorrow -NPO. Swallow eval -Monitor electrolytes level daily and keep K>4 and Mg>2 -K 3.6. Giving 40 meq K -Checking Mg -Seizure precausions -NS 75 ml/h x 10 h. Can give more IVF after echo and if no CMP  CMP Latest Ref Rng & Units 11/28/2019 11/28/2019 03/11/2019  Glucose 70 - 99 mg/dL 154(H) 172(H) 170(H)  BUN 6 - 20 mg/dL 16 17 23(H)  Creatinine 0.61 - 1.24 mg/dL 0.90 1.11 0.76  Sodium 135 - 145 mmol/L 143 139 141  Potassium 3.5 - 5.1 mmol/L 3.6 3.9 3.7  Chloride 98 - 111 mmol/L 105 106 104  CO2 22 - 32 mmol/L - 21(L) 24  Calcium 8.9 - 10.3 mg/dL - 8.1(L) 8.8(L)  Total Protein 6.5 - 8.1 g/dL - 5.7(L) 6.5  Total Bilirubin 0.3 - 1.2 mg/dL - 0.8 0.7  Alkaline Phos 38 - 126 U/L - 100 54  AST 15 - 41 U/L - 21 19  ALT 0 - 44 U/L - 14 21   CBC Latest Ref Rng & Units 11/28/2019 11/28/2019 03/11/2019  WBC 4.0 - 10.5 K/uL - 12.4(H) 7.2  Hemoglobin 13.0 - 17.0 g/dL 12.2(L) 13.2 13.3  Hematocrit 39 - 52 % 36.0(L) 40.6 39.8  Platelets 150 - 400 K/uL - 235 338   Hypoxia:  O2 sat at 80s on arrival. Required 4 lit [place]. likely post cardiac arrest/syncopal episdoe vs aspiration. CT reported rt lower lobe ground-glass and associated moderate right pleural effusion. Received 1 dose of Unasyn in ED for probable aspiration. Received dose Unasyn in ED.  -Will hold of further AB -Hypoxia resolved and is now in RA -Supplemental O2 as needed to keep O2 sat>94%   Slurred speech: -Ct head unremarkable -Per neurology, his symptoms is likely 2/2 hypotension during the event. But checking brain MRI -Neuro exam unremarkable except subjective slurred speech. -MRI pending -Npo. Swallow eval -Appreciate neurology f/u   Rib Fx 2/2 CPR: Nondisplaced fractures involving the posterolateral right  fourth through eighth ribs.  -Has mild pain -Tylenol PRN -Lidocaine patch for pain  Diet: NPO until passes  swallow eval IV fluid: s/p 1 li NS>NS 75 ml/h x 10 h VTE ppx: Lovenox Code status: Full  Dispo: Admit patient to Inpatient with expected length of stay greater than 2 midnights.  SignedDewayne Hatch, MD 11/28/2019, 2:30 PM  Pager: [phone] After 5pm on weekdays and 1pm on weekends: On Call pager: [phone]

## 2019-11-28 NOTE — ED Notes (Signed)
Patient transported to CT 

## 2019-11-28 NOTE — Telephone Encounter (Signed)
Opened in error nothing further to document at this time

## 2019-11-28 NOTE — Progress Notes (Signed)
Received patient from ED via stretcher; Awake, Alert, oriented and conversant. Sent out to radiology upon arrival to floor for MRI be completed. Reports chest soreness secondary to CPR  and "light tingling on face" No focal neurological deficit. Sinus rhythm on monitor. Bedside swallow performed and passed prior to given ordered potassium x 1. Mother called the unit and updated. Leveda Anna, BSN, RN

## 2019-11-28 NOTE — Progress Notes (Signed)
In as scheduled for his monthly injection of Invega [place] 156 mg. Today received it in his L deltoid. He brings his own injection because he has Medicaid. Mom with him and they have plans once they leave here to go to the [place] for items for moms projects. He denies any sx of his illness, and offers no complaints. Minutes after receiving his injection he complained of feeling light headed and having difficulty breathing. He has a history of asthma and advised to take two hits of his inhaler which is Albuterol. Put him on Dinamap and his O2 was 69 and at this point he is having trouble catching his breath and he is becoming cyanotic. Asked peer to get Dr and Probation officer called 911. Now both Eulis Canner NP and Marvia Pickles NP are present and Gari Crown has started chest compressions. Mom escorted from room. Writer brought defibrillator and 911 operator giving instructions. O2 applied to him and has a pulse so compressions ended. EMS and police are now present and taking over for staff. Patient is responsive to interventions and being taken to [place] consoled by staff peers. All of patients property returned to mom and [employer] officer transporting her to hospital to support son.

## 2019-11-28 NOTE — Progress Notes (Signed)
  Echocardiogram 2D Echocardiogram has been attempted. Patient not in room. Will reattempt at later date.  Randa Lynn Kimmarie Pascale 11/28/2019, 3:33 PM

## 2019-11-28 NOTE — ED Notes (Signed)
Pt c.o numbness in his face and heaviness in both his arms

## 2019-11-28 NOTE — ED Provider Notes (Signed)
[facility] EMERGENCY DEPARTMENT Provider Note   CSN: [id number] Arrival date & time: 11/28/19  2130     History No chief complaint on file.   Isaiah Black is a 44 y.o. male.  44 yo M with chief complaints of cardiac arrest.  Patient was at his psychiatrist's office for an [place] injection.  Shortly thereafter receiving it he became unresponsive.  Reportedly was pulseless and apneic.  Had CPR performed for an estimated 5 minutes.  Upon EMS arrival the patient had pulses but had significant respiratory depression.  They were able to support his respirations by bagging in route.  His mental status improved upon arrival here.  Upon arrival patient complains that he feels bad but has trouble defining it.  Denies headache neck pain chest pain abdominal pain.  Level 5 caveat altered mental status.  The history is provided by the patient and the EMS personnel.  Illness Severity:  Moderate Onset quality:  Gradual Duration:  15 minutes Timing:  Constant Progression:  Partially resolved Chronicity:  New Associated symptoms: no abdominal pain, no chest pain, no congestion, no diarrhea, no fever, no headaches, no myalgias, no rash, no shortness of breath and no vomiting        Past Medical History:  Diagnosis Date  . Anxiety   . Disassociation disorder   . Paranoid schizophrenia [facility])     Patient Active Problem List   Diagnosis Date Noted  . Schizophrenia ([place]) 07/06/2017    History reviewed. No pertinent surgical history.     No family history on file.  Social History   Tobacco Use  . Smoking status: Former Research scientist (life sciences)  . Smokeless tobacco: Never Used  . Tobacco comment: vape  Vaping Use  . Vaping Use: Every day  Substance Use Topics  . Alcohol use: Yes    Comment: occ  . Drug use: Not Currently    Types: Marijuana    Comment: Pt stated "I was vaping the pens"    Home Medications Prior to Admission medications   Medication Sig Start Date End Date  Taking? Authorizing Provider  paliperidone (INVEGA SUSTENNA) 156 MG/ML SUSY injection Inject 1 mL (156 mg total) into the muscle every 30 (thirty) days. 10/31/19  Yes Eulis Canner E, NP  albuterol (VENTOLIN HFA) 108 (90 Base) MCG/ACT inhaler Inhale 2 puffs into the lungs every 6 (six) hours as needed for wheezing or shortness of breath.    [provider]  benztropine (COGENTIN) 1 MG tablet Take 1 tablet (1 mg total) by mouth 2 (two) times daily. 10/31/19   Salley Slaughter, NP  citalopram (CELEXA) 20 MG tablet Take 1 tablet (20 mg total) by mouth daily. 10/31/19   Salley Slaughter, NP  traZODone (DESYREL) 100 MG tablet Take 1 tablet (100 mg total) by mouth at bedtime as needed for sleep. 10/31/19   Salley Slaughter, NP    Allergies    Patient has no known allergies.  Review of Systems   Review of Systems  Constitutional: Negative for chills and fever.  HENT: Negative for congestion and facial swelling.   Eyes: Negative for discharge and visual disturbance.  Respiratory: Negative for shortness of breath.   Cardiovascular: Negative for chest pain and palpitations.  Gastrointestinal: Negative for abdominal pain, diarrhea and vomiting.  Musculoskeletal: Negative for arthralgias and myalgias.  Skin: Negative for color change and rash.  Neurological: Negative for tremors, syncope and headaches.  Psychiatric/Behavioral: Negative for confusion and dysphoric mood.    Physical  Exam Updated Vital Signs BP 111/86   Pulse 87   Temp (!) 97 F (36.1 C) (Oral)   Resp (!) 30   Ht 5\' 6"  (1.676 m)   Wt 58.7 kg   SpO2 98%   BMI 20.89 kg/m   Physical Exam Vitals and nursing note reviewed.  Constitutional:      Appearance: He is well-developed.  HENT:     Head: Normocephalic and atraumatic.  Eyes:     Pupils: Pupils are equal, round, and reactive to light.  Neck:     Vascular: No JVD.  Cardiovascular:     Rate and Rhythm: Normal rate and regular rhythm.     Heart  sounds: No murmur heard.  No friction rub. No gallop.   Pulmonary:     Effort: No respiratory distress.     Breath sounds: No wheezing.  Abdominal:     General: There is no distension.     Tenderness: There is no abdominal tenderness. There is no guarding or rebound.  Musculoskeletal:        General: Normal range of motion.     Cervical back: Normal range of motion and neck supple.  Skin:    Coloration: Skin is not pale.     Findings: No rash.  Neurological:     Mental Status: He is alert.     Cranial Nerves: Cranial nerves are intact.     Comments: Confused response, slurred speech.  Global weakness worse to the arms than the legs.  Finger-nose and heel shin intact.  No cranial nerve deficit.  Psychiatric:        Behavior: Behavior normal.     ED Results / Procedures / Treatments   Labs (all labs ordered are listed, but only abnormal results are displayed) Labs Reviewed  CBC WITH DIFFERENTIAL/PLATELET - Abnormal; Notable for the following components:      Result Value   WBC 12.4 (*)    Neutro Abs 10.7 (*)    Abs Immature Granulocytes 0.14 (*)    All other components within normal limits  COMPREHENSIVE METABOLIC PANEL - Abnormal; Notable for the following components:   CO2 21 (*)    Glucose, Bld 172 (*)    Calcium 8.1 (*)    Total Protein 5.7 (*)    Albumin 3.2 (*)    All other components within normal limits  CBG MONITORING, ED - Abnormal; Notable for the following components:   Glucose-Capillary 164 (*)    All other components within normal limits  I-STAT CHEM 8, ED - Abnormal; Notable for the following components:   Glucose, Bld 154 (*)    Calcium, Ion 1.00 (*)    TCO2 21 (*)    Hemoglobin 12.2 (*)    HCT 36.0 (*)    All other components within normal limits  RESPIRATORY PANEL BY RT PCR (FLU A&B, COVID)  URINALYSIS, ROUTINE W REFLEX MICROSCOPIC  RAPID URINE DRUG SCREEN, HOSP PERFORMED  TROPONIN I (HIGH SENSITIVITY)  TROPONIN I (HIGH SENSITIVITY)    EKG EKG  Interpretation  Date/Time:  Wednesday November 28 2019 09:48:46 EDT Ventricular Rate:  103 PR Interval:    QRS Duration: 102 QT Interval:  361 QTC Calculation: 473 R Axis:   55 Text Interpretation: Sinus tachycardia RSR' in V1 or V2, probably normal variant ST elev, probable normal early repol pattern No significant change since last tracing Confirmed by Deno Etienne [phone]) on 11/28/2019 10:11:36 AM   Radiology CT Head Wo Contrast  Result Date: 11/28/2019  CLINICAL DATA:  Delirium.  Neck trauma.  Syncope with collapse. EXAM: CT HEAD WITHOUT CONTRAST CT CERVICAL SPINE WITHOUT CONTRAST TECHNIQUE: Multidetector CT imaging of the head and cervical spine was performed following the standard protocol without intravenous contrast. Multiplanar CT image reconstructions of the cervical spine were also generated. COMPARISON:  None. FINDINGS: CT HEAD FINDINGS Brain: No evidence of acute large vascular territory infarction, hemorrhage, hydrocephalus, supratentorail extra-axial collection or mass lesion/mass effect. 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 No substantial mass effect. The fourth ventricle is widely patent. Vascular: No hyperdense vessel or unexpected calcification. Skull: No acute fracture. Sinuses/Orbits: Opacification of scattered ethmoid air cells with mucosal thickening. Frothy secretions in the right sphenoid sinus and retention cyst in the left sphenoid sinus. Mucosal thickening of the partially imaged maxillary sinuses. Other: No mastoid effusions. CT CERVICAL SPINE FINDINGS Alignment: Levocurvature. Trace retrolisthesis of C3 on C4, favor degenerative. Skull base and vertebrae: No acute fracture. Vertebral body heights are maintained. Abnormal right C5-C6 and C6-C7 facet joints with incomplete fusion of the posterior elements at C5, paddle with congenital/developmental segmentation anomaly. Soft tissues and spinal canal: No prevertebral  fluid or swelling. No visible canal hematoma. Disc levels:  Mild multilevel degenerative disc disease. Upper chest: Further evaluated on concurrent CT of the chest. IMPRESSION: CT head: 1. No evidence of acute intracranial abnormality. 2. Paranasal sinus disease, which is nonspecific but can be seen with sinusitis. CT cervical spine: 1. No evidence of acute fracture or traumatic malalignment. 2. 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 Resulting lower cervical levocurvature. Electronically Signed   By: Margaretha Sheffield MD   On: 11/28/2019 11:33   CT Angio Chest PE W and/or Wo Contrast  Result Date: 11/28/2019 CLINICAL DATA:  Shortness of breath, syncope. EXAM: CT ANGIOGRAPHY CHEST WITH CONTRAST TECHNIQUE: Multidetector CT imaging of the chest was performed using the standard protocol during bolus administration of intravenous contrast. Multiplanar CT image reconstructions and MIPs were obtained to evaluate the vascular anatomy. CONTRAST:  56mL OMNIPAQUE IOHEXOL 350 MG/ML SOLN COMPARISON:  None. FINDINGS: Cardiovascular: Image quality is somewhat degraded by respiratory motion. Negative for pulmonary embolus. Vascular structures are unremarkable. Heart size normal. No pericardial effusion. Mediastinum/Nodes: No pathologically enlarged mediastinal lymph nodes. Right hilar lymph node measures 11 mm. No axillary adenopathy. Esophagus is grossly unremarkable. Lungs/Pleura: Image quality is degraded by respiratory motion. Dependent ground-glass in the right lower lobe. Mild ill-defined centrilobular nodularity, upper and midlung zone predominant, likely due to smoking or vaping related respiratory bronchiolitis. A few millimetric pulmonary nodules measure up to 3 mm in the anterior left lower lobe (7/91). Moderate right pleural effusion. Airway is unremarkable. Upper Abdomen: Visualized portions  of the liver, gallbladder, adrenal glands, kidneys, spleen, pancreas and stomach are grossly unremarkable. Musculoskeletal: There is an acute appearing nondisplaced fracture of the right fifth posterolateral rib. Additional healed fractures of mid and lower posterolateral right ribs. Review of the MIP images confirms the above findings. IMPRESSION: 1. Negative for pulmonary embolus. 2. Right lower lobe ground-glass may be due to pneumonia. Associated moderate right pleural effusion. 3. Borderline enlarged right hilar lymph node, likely reactive. Electronically Signed   By: Lorin Picket M.D.   On: 11/28/2019 11:27   CT Cervical Spine Wo Contrast  Result Date: 11/28/2019 CLINICAL DATA:  Delirium.  Neck trauma.  Syncope with collapse. EXAM: CT HEAD WITHOUT CONTRAST CT CERVICAL SPINE  WITHOUT CONTRAST TECHNIQUE: Multidetector CT imaging of the head and cervical spine was performed following the standard protocol without intravenous contrast. Multiplanar CT image reconstructions of the cervical spine were also generated. COMPARISON:  None. FINDINGS: CT HEAD FINDINGS Brain: No evidence of acute large vascular territory infarction, hemorrhage, hydrocephalus, supratentorail extra-axial collection or mass lesion/mass effect. 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 No substantial mass effect. The fourth ventricle is widely patent. Vascular: No hyperdense vessel or unexpected calcification. Skull: No acute fracture. Sinuses/Orbits: Opacification of scattered ethmoid air cells with mucosal thickening. Frothy secretions in the right sphenoid sinus and retention cyst in the left sphenoid sinus. Mucosal thickening of the partially imaged maxillary sinuses. Other: No mastoid effusions. CT CERVICAL SPINE FINDINGS Alignment: Levocurvature. Trace retrolisthesis of C3 on C4, favor degenerative. Skull base and vertebrae: No acute fracture. Vertebral body heights are  maintained. Abnormal right C5-C6 and C6-C7 facet joints with incomplete fusion of the posterior elements at C5, paddle with congenital/developmental segmentation anomaly. Soft tissues and spinal canal: No prevertebral fluid or swelling. No visible canal hematoma. Disc levels:  Mild multilevel degenerative disc disease. Upper chest: Further evaluated on concurrent CT of the chest. IMPRESSION: CT head: 1. No evidence of acute intracranial abnormality. 2. Paranasal sinus disease, which is nonspecific but can be seen with sinusitis. CT cervical spine: 1. No evidence of acute fracture or traumatic malalignment. 2. 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 Resulting lower cervical levocurvature. Electronically Signed   By: Margaretha Sheffield MD   On: 11/28/2019 11:33   DG Chest Port 1 View  Result Date: 11/28/2019 CLINICAL DATA:  Post CPR. Shortness of breath followed by unresponsiveness after taking normal dose of [place]. EXAM: PORTABLE CHEST 1 VIEW COMPARISON:  05/10/2017. FINDINGS: Trachea is midline. Heart size normal. Lungs are clear. There may be a tiny right pleural effusion. No pneumothorax. There are nondisplaced fractures of the right fourth through eighth posterolateral ribs. IMPRESSION: 1. Probable tiny right pleural effusion. 2. Nondisplaced fractures involving the posterolateral right fourth through eighth ribs. Electronically Signed   By: Lorin Picket M.D.   On: 11/28/2019 10:14    Procedures Procedures (including critical care time)  Medications Ordered in ED Medications  Ampicillin-Sulbactam (UNASYN) 3 g in sodium chloride 0.9 % 100 mL IVPB (has no administration in time range)  ondansetron (ZOFRAN) injection 4 mg (4 mg Intravenous Given 11/28/19 0956)  sodium chloride 0.9 % bolus 1,000 mL (0 mLs Intravenous Stopped 11/28/19 1120)  iohexol (OMNIPAQUE) 350  MG/ML injection 65 mL (65 mLs Intravenous Contrast Given 11/28/19 1114)    ED Course  I have reviewed the triage vital signs and the nursing notes.  Pertinent labs & imaging results that were available during my care of the patient were reviewed by me and considered in my medical decision making (see chart for details).    MDM Rules/Calculators/A&P                          44 yo M with a chief complaints of cardiac arrest.  This was reported by a psychiatric office that was giving him an injection of Invega.  Occurred shortly thereafter giving the medicine.  Had CPR performed for about 5 minutes and upon EMS arrival he had pulses.  He had a automated defibrillator pads placed by psychiatry and no shock was advised.  He had respiratory depression that improved in route.  Upon arrival to the ED the patient is controlling his airway.  His nasal trumpet was removed.  Confused on initial exam.  On reassessment about 15 minutes later the patient complaining of weakness and numbness to his arms mostly in the hands and a sensation that he is slurring his words and feels like his mouth is dry.  No focal neurologic deficit for me on exam.  I discussed this with neurology, Dr Leonel Ramsay, who thought this might be a sign of hypoperfusion.  Recommended an MRI.  On review of the literature it appears there has been a couple cases of pulmonary embolism with Invega administration.  Does not appear to be an acute problem immediately post injection though.  I wonder if the medication was perhaps injected intravascularly.  There is no QT prolongation on his EKG here.  He is hypoxic.  Will obtain a CT angiogram of the chest.  CT of the head and the C-spine.  Lab work reassess.  CT without PE.  CT head without ICH.  CT c spine without fx or malalignment.  CT of the chest also with a right-sided pneumonia likely aspiration.  Will start on Unasyn.  Patient still requiring 4 L of oxygen.  Will discuss with medicine for  admission.  Internal medicine residency service requesting a discussion with critical care prior to admission.  I did discuss the case with critical care who came and evaluated the patient at bedside.  Residency service to admit.  CRITICAL CARE Performed by: Cecilio Asper   Total critical care time: 35 minutes  Critical care time was exclusive of separately billable procedures and treating other patients.  Critical care was necessary to treat or prevent imminent or life-threatening deterioration.  Critical care was time spent personally by me on the following activities: development of treatment plan with patient and/or surrogate as well as nursing, discussions with consultants, evaluation of patient's response to treatment, examination of patient, obtaining history from patient or surrogate, ordering and performing treatments and interventions, ordering and review of laboratory studies, ordering and review of radiographic studies, pulse oximetry and re-evaluation of patient's condition.  The patients results and plan were reviewed and discussed.   Any x-rays performed were independently reviewed by myself.   Differential diagnosis were considered with the presenting HPI.  Medications  Ampicillin-Sulbactam (UNASYN) 3 g in sodium chloride 0.9 % 100 mL IVPB (has no administration in time range)  ondansetron (ZOFRAN) injection 4 mg (4 mg Intravenous Given 11/28/19 0956)  sodium chloride 0.9 % bolus 1,000 mL (0 mLs Intravenous Stopped 11/28/19 1120)  iohexol (OMNIPAQUE) 350 MG/ML injection 65 mL (65 mLs Intravenous Contrast Given 11/28/19 1114)    Vitals:   11/28/19 0950 11/28/19 1000 11/28/19 1009 11/28/19 1030  BP: (!) 127/98 (!) 130/94  111/86  Pulse: (!) 120 (!) 111 100 87  Resp: (!) 22 (!) 38 (!) 34 (!) 30  Temp: (!) 97 F (36.1 C)     TempSrc: Oral     SpO2: 96% (!) 86% 91% 98%  Weight:      Height:        Final diagnoses:  Respiratory arrest ([place])  Aspiration  pneumonia of right lower lobe due to vomit [facility])    Admission/ observation were discussed with the admitting physician, patient and/or family and they are comfortable with the plan.   Final Clinical Impression(s) / ED Diagnoses Final diagnoses:  Respiratory arrest ([place])  Aspiration pneumonia of  right lower lobe due to vomit [facility])    Rx / DC Orders ED Discharge Orders    None       Deno Etienne, DO 11/28/19 1441

## 2019-11-28 NOTE — Progress Notes (Signed)
Patient cannot have MRI due to him having a monitored ankle bracelet on. RN aware. Waiting for it to be removed.

## 2019-11-28 NOTE — Telephone Encounter (Signed)
Call from [place], patients mom to update me on his condition. States he is able to communicate, remembers very little of the events after saying to me he was feeling dizzy. He will be spending the night at the hospital and currently he is having a CT scan done. Offered mom much support and will check in with her tomorrow.

## 2019-11-28 NOTE — ED Triage Notes (Signed)
Pt from [facility] with ems as post CPR. Pt received his normal injection of [place], pt then started to c.o feeling SOB, used his inhaler and then went unresponsive, [facility] staff reports pt was pulseless, CPR initiated for 5 mins. Upon EMS arrival pt was unresponsive but breathing on his own about 4-5 times per min. Pt bagged for a while and started to become more responsive. Upon arrival to ED pt eyes open, talking, does not remember what happened. VSS CBG 113

## 2019-11-29 ENCOUNTER — Ambulatory Visit (HOSPITAL_COMMUNITY): Payer: Self-pay | Admitting: Licensed Clinical Social Worker

## 2019-11-29 ENCOUNTER — Inpatient Hospital Stay (HOSPITAL_COMMUNITY): Payer: Medicaid Other

## 2019-11-29 DIAGNOSIS — I469 Cardiac arrest, cause unspecified: Secondary | ICD-10-CM

## 2019-11-29 LAB — CBC
HCT: 34.2 % — ABNORMAL LOW (ref 39.0–52.0)
Hemoglobin: 11.3 g/dL — ABNORMAL LOW (ref 13.0–17.0)
MCH: 29.7 pg (ref 26.0–34.0)
MCHC: 33 g/dL (ref 30.0–36.0)
MCV: 89.8 fL (ref 80.0–100.0)
Platelets: 217 10*3/uL (ref 150–400)
RBC: 3.81 MIL/uL — ABNORMAL LOW (ref 4.22–5.81)
RDW: 11.8 % (ref 11.5–15.5)
WBC: 6.5 10*3/uL (ref 4.0–10.5)
nRBC: 0 % (ref 0.0–0.2)

## 2019-11-29 LAB — COMPREHENSIVE METABOLIC PANEL
ALT: 12 U/L (ref 0–44)
AST: 13 U/L — ABNORMAL LOW (ref 15–41)
Albumin: 2.9 g/dL — ABNORMAL LOW (ref 3.5–5.0)
Alkaline Phosphatase: 88 U/L (ref 38–126)
Anion gap: 10 (ref 5–15)
BUN: 13 mg/dL (ref 6–20)
CO2: 22 mmol/L (ref 22–32)
Calcium: 7.8 mg/dL — ABNORMAL LOW (ref 8.9–10.3)
Chloride: 107 mmol/L (ref 98–111)
Creatinine, Ser: 0.94 mg/dL (ref 0.61–1.24)
GFR, Estimated: >60 mL/min (ref >60)
Glucose, Bld: 92 mg/dL (ref 70–99)
Potassium: 3.9 mmol/L (ref 3.5–5.1)
Sodium: 139 mmol/L (ref 135–145)
Total Bilirubin: 1 mg/dL (ref 0.3–1.2)
Total Protein: 5 g/dL — ABNORMAL LOW (ref 6.5–8.1)

## 2019-11-29 LAB — ECHOCARDIOGRAM COMPLETE
Area-P 1/2: 2.91 cm2
Height: 66 in
S' Lateral: 2.9 cm
Weight: 2038.4 oz

## 2019-11-29 MED ORDER — CITALOPRAM HYDROBROMIDE 20 MG PO TABS
20.0000 mg | ORAL_TABLET | Freq: Every day | ORAL | Status: DC
  Administered 2019-11-29: 20 mg via ORAL
  Filled 2019-11-29: qty 1

## 2019-11-29 MED ORDER — BENZTROPINE MESYLATE 1 MG PO TABS
1.0000 mg | ORAL_TABLET | Freq: Two times a day (BID) | ORAL | Status: DC
  Administered 2019-11-29 – 2019-11-30 (×2): 1 mg via ORAL
  Filled 2019-11-29 (×3): qty 1

## 2019-11-29 NOTE — Progress Notes (Signed)
Echocardiogram 2D Echocardiogram has been performed.  Oneal Deputy Oneida Mckamey 11/29/2019, 11:48 AM

## 2019-11-29 NOTE — Progress Notes (Addendum)
HD#1 Subjective:  Overnight Events: Paged by nursing, patient tachycardic   Upon bedside examination, patient resting in bed comfortably. He was complaining of left sided chest pain where he states he broke his ribs from the CPR yesterday. He denies shortness of breath, feeling as though his heart is racing, numbness or tingling. He notes tingling from yesterday has resolved. He has no other complaints at this time. Informed of his negative tests  Objective:  Vital signs in last 24 hours: Vitals:   11/29/19 0409 11/29/19 0616 11/29/19 0800 11/29/19 1320  BP:    110/75  Pulse:   92 (!) 120  Resp:   12 20  Temp: 98 F (36.7 C)   97.8 F (36.6 C)  TempSrc: Oral   Oral  SpO2:   91% 95%  Weight:  57.8 kg    Height:       Supplemental O2: Room Air SpO2: 95 % O2 Flow Rate (L/min): 1 L/min   Physical Exam:  Physical Exam Vitals and nursing note reviewed.  Constitutional:      General: He is not in acute distress.    Appearance: Normal appearance. He is not ill-appearing, toxic-appearing or diaphoretic.  HENT:     Mouth/Throat:     Mouth: Mucous membranes are moist.  Cardiovascular:     Rate and Rhythm: Regular rhythm. Tachycardia present.     Pulses: Normal pulses.     Heart sounds: Normal heart sounds. No murmur heard.  No friction rub. No gallop.   Pulmonary:     Effort: Pulmonary effort is normal. No respiratory distress.     Breath sounds: Normal breath sounds. No stridor. No wheezing, rhonchi or rales.  Abdominal:     General: Bowel sounds are normal.     Tenderness: There is no abdominal tenderness.  Musculoskeletal:     Right lower leg: No edema.  Skin:    Coloration: Skin is pale.  Neurological:     General: No focal deficit present.     Mental Status: He is alert and oriented to person, place, and time. Mental status is at baseline.  Psychiatric:        Mood and Affect: Mood normal.        Behavior: Behavior normal.     Filed Weights   11/28/19 0948  11/29/19 0616  Weight: 58.7 kg 57.8 kg     Intake/Output Summary (Last 24 hours) at 11/29/2019 1349 Last data filed at 11/29/2019 1321 Gross per 24 hour  Intake 1117.75 ml  Output 1200 ml  Net -82.25 ml   Net IO Since Admission: 1,017.75 mL [11/29/19 1349]  Pertinent Labs: CBC Latest Ref Rng & Units 11/29/2019 11/28/2019 11/28/2019  WBC 4.0 - 10.5 K/uL 6.5 - 12.4(H)  Hemoglobin 13.0 - 17.0 g/dL 11.3(L) 12.2(L) 13.2  Hematocrit 39 - 52 % 34.2(L) 36.0(L) 40.6  Platelets 150 - 400 K/uL 217 - 235    CMP Latest Ref Rng & Units 11/29/2019 11/28/2019 11/28/2019  Glucose 70 - 99 mg/dL 92 154(H) 172(H)  BUN 6 - 20 mg/dL 13 16 17   Creatinine 0.61 - 1.24 mg/dL 0.94 0.90 1.11  Sodium 135 - 145 mmol/L 139 143 139  Potassium 3.5 - 5.1 mmol/L 3.9 3.6 3.9  Chloride 98 - 111 mmol/L 107 105 106  CO2 22 - 32 mmol/L 22 - 21(L)  Calcium 8.9 - 10.3 mg/dL 7.8(L) - 8.1(L)  Total Protein 6.5 - 8.1 g/dL 5.0(L) - 5.7(L)  Total Bilirubin 0.3 - 1.2 mg/dL  1.0 - 0.8  Alkaline Phos 38 - 126 U/L 88 - 100  AST 15 - 41 U/L 13(L) - 21  ALT 0 - 44 U/L 12 - 14    Imaging: MR BRAIN WO CONTRAST  Result Date: 11/28/2019 CLINICAL DATA:  Neuro deficit, acute stroke suspected. EXAM: MRI HEAD WITHOUT CONTRAST TECHNIQUE: Multiplanar, multiecho pulse sequences of the brain and surrounding structures were obtained without intravenous contrast. COMPARISON:  None. FINDINGS: Brain: No acute infarction, hemorrhage, hydrocephalus, extra-axial collection or mass lesion. 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 The fourth ventricle is widely patent. Vascular: Major arterial flow voids are maintained at the skull base. Skull and upper cervical spine: Normal marrow signal. Sinuses/Orbits: Mucosal thickening of bilateral maxillary sinuses, scattered ethmoid air cells, and frontal sinuses. Right sphenoid sinus retention cyst. Unremarkable orbits. IMPRESSION: No evidence of acute intracranial  abnormality. Specifically, no acute infarct. Electronically Signed   By: Margaretha Sheffield MD   On: 11/28/2019 16:10   MR Cervical Spine Wo Contrast  Result Date: 11/28/2019 CLINICAL DATA:  Recent cardiac arrest with cardiopulmonary resuscitation. Neck trauma with focal neural deficit. EXAM: MRI CERVICAL SPINE WITHOUT CONTRAST TECHNIQUE: Multiplanar, multisequence MR imaging of the cervical spine was performed. No intravenous contrast was administered. COMPARISON:  CT cervical spine same date. FINDINGS: Alignment: Stable and near anatomic. Slight retrolisthesis at C3-4 and C4-5. Vertebrae: No evidence of acute fracture or traumatic subluxation. No paraspinal edema to suggest ligamentous injury. Cord: Normal in signal and caliber. 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 The craniocervical junction appears normal. There is mild mucosal thickening in the maxillary sinuses bilaterally. No paraspinal abnormalities are seen. Bilateral vertebral artery flow voids. Disc levels: C2-3: Normal interspace. C3-4: Mild disc bulging and uncinate spurring. No cord deformity or foraminal compromise. C4-5: Mild disc bulging with asymmetric uncinate spurring on the left contributing to mild left foraminal narrowing. No cord deformity. 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 Mild disc bulging and uncinate spurring contributing to mild right foraminal narrowing. No cord deformity. 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 Mild disc bulging and mild uncinate spurring contributing to mild left foraminal narrowing. No cord deformity. C7-T1: Normal interspace. IMPRESSION: 1. No acute findings or explanation for the patient's symptoms. No evidence of cord contusion or ligamentous injury. 2. Mild spondylosis with disc bulging and uncinate spurring as described. There is mild foraminal  narrowing as described. No cord deformity. 3. 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 Electronically Signed   By: Richardean Sale M.D.   On: 11/28/2019 16:41    Assessment/Plan:   Active Problems:   Cardiac arrest [facility])  Patient Summary: Isaiah Black is a 44 y.o. with a pertinent PMH of schizophrenia, who presented after syncopal/cardiac arrest event after Invega infusion and admitted for workup of his cardiac arrest episode.   Acute cardiac arrest vs. Syncope Patient with syncopal episode needing CPR after patient found pulseless. Suspect related to invega infusion, patient with no QT prolongation since admission. UDS negative, prior positive for benzo's. May have interacted with invega and caused respiratory depression. Head imaging of CT, MRI negative for acute process at this time. Patient with strong family history of sudden cardiac death, will continue with cardiac workup. -echocardiogram pending at this time -EKG today, sinus tachycardia. No QT prolongation appreciated.  -continue telemetry -UDS (-)  Tachycardic  Patient found to be tachycardic by nursing staff, rate in 130-140's. Telemetry did not reveal arrhythmia. EKG performed and unremarkable aside from tachycardia. Patient asymptomatic. DDx includes inflammatory process - fractures that occurred yesterday. Will continue to monitor. -continue to monitor on telemetry -echocardiogram today  -consider beta blocker post echocardiogram  Diet: Normal IVF: None,None VTE: Enoxaparin Code: Full PT/OT recs: None, none.  Dispo: Anticipated discharge to Home in 1-2 days pending cardiac workup.   Sanjuana Letters DO Internal Medicine Resident PGY-1 Pager [phone] Please contact the on call pager after 5 pm and on weekends at [phone].

## 2019-11-29 NOTE — Progress Notes (Signed)
MD notified about patient having tachycardia but being asympotomatic, they will order an EKG and come see the patient soon.

## 2019-11-29 NOTE — Evaluation (Signed)
Clinical/Bedside Swallow Evaluation Patient Details  Name: Isaiah Black MRN: [id number] Date of Birth: 12/23/1975  Today's Date: 11/29/2019 Time: SLP Start Time (ACUTE ONLY): 0850 SLP Stop Time (ACUTE ONLY): 0905 SLP Time Calculation (min) (ACUTE ONLY): 15 min  Past Medical History:  Past Medical History:  Diagnosis Date  . Anxiety   . Disassociation disorder   . Paranoid schizophrenia ([place])    Past Surgical History: History reviewed. No pertinent surgical history. HPI:  Pt is a 44 y.o. male with PMHx significant for schizophrenia who presented after reported to have cardiac arrest at psych clinic.  Reportedly he found to be pulseless, apneic and hypoxic per psych clinic staff evaluation. He received 5 minutes of CPR and had AED placed. He had ROSC after 5 min and by EMS arrival but was not responsive. CXR 10/27: Probable tiny right pleural effusion. MRI brain: No evidence of acute intracranial abnormality.    Assessment / Plan / Recommendation Clinical Impression  Pt was seen for bedside swallow evaluation and he denied a history of dysphagia. Oral mechanism exam was WNL and he presented with adequate, natural dentition. He tolerated all solids and liquids without signs or symptoms of oropharyngeal dysphagia. A regular texture diet with thin liquids is recommended at this time and further skilled SLP services are not clinically indicated for swallowing. SLP Visit Diagnosis: Dysphagia, unspecified (R13.10)    Aspiration Risk  No limitations    Diet Recommendation Regular;Thin liquid   Liquid Administration via: Cup;Straw Medication Administration: Whole meds with liquid Supervision: Patient able to self feed Postural Changes: Seated upright at 90 degrees    Other  Recommendations Oral Care Recommendations: Oral care BID   Follow up Recommendations None      Frequency and Duration            Prognosis        Swallow Study   General Date of Onset: 11/28/19 HPI: Pt is a  44 y.o. male with PMHx significant for schizophrenia who presented after reported to have cardiac arrest at psych clinic.  Reportedly he found to be pulseless, apneic and hypoxic per psych clinic staff evaluation. He received 5 minutes of CPR and had AED placed. He had ROSC after 5 min and by EMS arrival but was not responsive. CXR 10/27: Probable tiny right pleural effusion. MRI brain: No evidence of acute intracranial abnormality.  Type of Study: Bedside Swallow Evaluation Previous Swallow Assessment: none Diet Prior to this Study: NPO Temperature Spikes Noted: No Respiratory Status: Room air History of Recent Intubation: No Behavior/Cognition: Alert;Cooperative;Pleasant mood Oral Cavity Assessment: Within Functional Limits Oral Care Completed by SLP: No Oral Cavity - Dentition: Adequate natural dentition Vision: Functional for self-feeding Self-Feeding Abilities: Able to feed self Patient Positioning: Upright in bed;Postural control adequate for testing Baseline Vocal Quality: Normal Volitional Cough: Strong Volitional Swallow: Able to elicit    Oral/Motor/Sensory Function Overall Oral Motor/Sensory Function: Within functional limits   Ice Chips Ice chips: Within functional limits Presentation: Spoon   Thin Liquid Thin Liquid: Within functional limits Presentation: Straw;Cup    Nectar Thick Nectar Thick Liquid: Not tested   Honey Thick Honey Thick Liquid: Not tested   Puree Puree: Within functional limits Presentation: Spoon   Solid    Xeng Kucher I. Hardin Negus, [place], [place] Office number [phone] Pager [phone]  Solid: Within functional limits Presentation: Self Fed      Horton Marshall 11/29/2019,9:06 AM

## 2019-11-29 NOTE — Progress Notes (Signed)
Report given to [facility], Therapist, sports. Patient is alert and oriented x4 resting in bed with call light in reach. VSS. No events noted overnight. Plan is for SLP to see patient today.  Hgb 11.3, platelets 217, K+ 3.9, Creat 0.94 and Mg 2.3 this AM.

## 2019-11-29 NOTE — Progress Notes (Signed)
   11/29/19 1320  Assess: MEWS Score  Temp 97.8 F (36.6 C)  BP 110/75  Pulse Rate (!) 120  ECG Heart Rate (!) 119  Resp 20  Level of Consciousness Alert  SpO2 95 %  O2 Device Room Air  Assess: MEWS Score  MEWS Temp 0  MEWS Systolic 0  MEWS Pulse 2  MEWS RR 0  MEWS LOC 0  MEWS Score 2  MEWS Score Color Yellow  Assess: if the MEWS score is Yellow or Red  Were vital signs taken at a resting state? Yes  Focused Assessment Change from prior assessment (see assessment flowsheet)  Early Detection of Sepsis Score *See Row Information* Low  MEWS guidelines implemented *See Row Information* Yes  Treat  MEWS Interventions Other (Comment) (spoke with MD)  Pain Scale 0-10  Pain Score 0  Take Vital Signs  Increase Vital Sign Frequency  Yellow: Q 2hr X 2 then Q 4hr X 2, if remains yellow, continue Q 4hrs  Escalate  MEWS: Escalate Yellow: discuss with charge nurse/RN and consider discussing with provider and RRT  Notify: Charge Nurse/RN  Name of Charge Nurse/RN Notified Erasmo Downer  Date Charge Nurse/RN Notified 11/29/19  Time Charge Nurse/RN Notified 1328  Notify: Provider  Provider Name/Title Dr. Venia Minks  Date Provider Notified 11/29/19  Time Provider Notified 1329  Notification Type Page  Notification Reason Change in status  Response No new orders (Did EKG earlier and will continue to monitor)  Date of Provider Response 11/29/19  Time of Provider Response 1329  Document  Patient Outcome Other (Comment) (will continue to monitor HR)  Progress note created (see row info) Yes

## 2019-11-30 DIAGNOSIS — R092 Respiratory arrest: Secondary | ICD-10-CM | POA: Diagnosis not present

## 2019-11-30 MED ORDER — IPRATROPIUM-ALBUTEROL 0.5-2.5 (3) MG/3ML IN SOLN: 3.0000 mL | Freq: Four times a day (QID) | RESPIRATORY_TRACT | Status: DC

## 2019-11-30 MED ORDER — GUAIFENESIN-DM 100-10 MG/5ML PO SYRP
5.0000 mL | ORAL_SOLUTION | ORAL | Status: DC | PRN
  Administered 2019-11-30: 5 mL via ORAL
  Filled 2019-11-30: qty 5

## 2019-11-30 NOTE — Discharge Instructions (Signed)
Mr. Amis it was a pleasure caring for you during your admission. You were admitted after an episode of respiratory failure. Please stop taking your invega injections and meet with your psychiatrist as soon as possible to come up with a future plan. Social work will discuss primary care physicians before you leave.   If you have any further episodes of trouble breathing please call 911.   Medication Changes Stop INVEGA injections

## 2019-11-30 NOTE — Care Management (Signed)
1329 11-30-19 Case Manager notified parole officer April that the patient will transition home today. Parole officer stated that patient is free to leave without ankle bracelet on and to have the patient call her once he gets home to place the bracelet back on for house arrest. Patient is aware of plan of care. Patient is waiting on his mother to arrive. Graves-Bigelow, Ocie Cornfield, RN, BSN Case Manager

## 2019-11-30 NOTE — Progress Notes (Signed)
Pt's HR went up to 150s while using a urinal.  Pt states he is feeling fine.  Currently his HR is in 130s.  Idolina Primer, RN

## 2019-11-30 NOTE — Consult Note (Signed)
Cardiology Consultation:   Patient ID: Isaiah Black MRN: [id number]; DOB: 19-Nov-1975  Admit date: 11/28/2019 Date of Consult: 11/30/2019  Primary Care Provider: Patient, No Pcp Per [place] Cardiologist: No primary care provider on file. new [facility] Electrophysiologist:  None    Patient Profile:   Isaiah Black is a 44 y.o. male with a hx of schizophrenia  who is being seen today for the evaluation of cardiopulmonary arrest  at the request of Dr Johnney Ou.  History of Present Illness:   Isaiah Black is a 44 yo WM with history of Schizophrenia. He was receiving his fourth or fifth injection of Invega yesterday at the psychiatry office. Afterwards he was sitting in a chair and developed acute dizziness and difficulty breathing. Mother states he complained of the room spinning around and his face and hands turned blue. He had agonal respirations and no pulse. CPR initiated. AED advised no shock. Regained pulse after 5 minutes of CPR but had persistent respiratory depression requiring bagging until he came to ED. Patient denies any chest pain prior to arrest. No prior history of syncope or other cardiac disease. No family history of arrhythmogenic cardiac death. See family history below.    Past Medical History:  Diagnosis Date  . Anxiety   . Disassociation disorder   . Paranoid schizophrenia ([place])     History reviewed. No pertinent surgical history.   Home Medications:  Prior to Admission medications   Medication Sig Start Date End Date Taking? Authorizing Provider  albuterol (VENTOLIN HFA) 108 (90 Base) MCG/ACT inhaler Inhale 2 puffs into the lungs every 6 (six) hours as needed for wheezing or shortness of breath.   Yes [provider]  benztropine (COGENTIN) 1 MG tablet Take 1 tablet (1 mg total) by mouth 2 (two) times daily. 10/31/19  Yes Eulis Canner E, NP  citalopram (CELEXA) 20 MG tablet Take 1 tablet (20 mg total) by mouth daily. Patient taking  differently: Take 20 mg by mouth daily after supper.  10/31/19  Yes Eulis Canner E, NP  paliperidone (INVEGA SUSTENNA) 156 MG/ML SUSY injection Inject 1 mL (156 mg total) into the muscle every 30 (thirty) days. 10/31/19  Yes Eulis Canner E, NP  traZODone (DESYREL) 100 MG tablet Take 1 tablet (100 mg total) by mouth at bedtime as needed for sleep. Patient taking differently: Take 100 mg by mouth at bedtime.  10/31/19  Yes Salley Slaughter, NP    Inpatient Medications: Scheduled Meds: . benztropine  1 mg Oral BID  . citalopram  20 mg Oral QPC supper  . enoxaparin (LOVENOX) injection  40 mg Subcutaneous Q24H  . ipratropium-albuterol  3 mL Nebulization Q6H  . lidocaine  1 patch Transdermal Q24H   Continuous Infusions:  PRN Meds: acetaminophen **OR** acetaminophen, guaiFENesin-dextromethorphan  Allergies:   No Known Allergies  Social History:   Social History   Socioeconomic History  . Marital status: Single    Spouse name: Not on file  . Number of children: 1  . Years of education: Not on file  . Highest education level: Not on file  Occupational History  . Not on file  Tobacco Use  . Smoking status: Former Research scientist (life sciences)  . Smokeless tobacco: Never Used  . Tobacco comment: vape  Vaping Use  . Vaping Use: Former  Substance and Sexual Activity  . Alcohol use: Yes    Comment: occ  . Drug use: Not Currently    Types: Marijuana    Comment: Pt stated "I was vaping  the pens"  . Sexual activity: Yes  Other Topics Concern  . Not on file  Social History Narrative  . Not on file   Social Determinants of Health   Financial Resource Strain: High Risk  . Difficulty of Paying Living Expenses: Very hard  Food Insecurity:   . Worried About Charity fundraiser in the Last Year: Not on file  . Ran Out of Food in the Last Year: Not on file  Transportation Needs:   . Lack of Transportation (Medical): Not on file  . Lack of Transportation (Non-Medical): Not on file  Physical  Activity:   . Days of Exercise per Week: Not on file  . Minutes of Exercise per Session: Not on file  Stress:   . Feeling of Stress : Not on file  Social Connections:   . Frequency of Communication with Friends and Family: Not on file  . Frequency of Social Gatherings with Friends and Family: Not on file  . Attends Religious Services: Not on file  . Active Member of Clubs or Organizations: Not on file  . Attends Archivist Meetings: Not on file  . Marital Status: Not on file  Intimate Partner Violence:   . Fear of Current or Ex-Partner: Not on file  . Emotionally Abused: Not on file  . Physically Abused: Not on file  . Sexually Abused: Not on file    Family History:   Paternal grandmother died at age 27 with massive MI. Paternal grandfather died age early 9s with cerebral aneurysm. Paternal uncle died of cerebral aneurysm age 32. Another paternal uncle died in early 13s but felt to have a stroke. Maternal family significant for DM and cancer.   ROS:  Please see the history of present illness.  All other ROS reviewed and negative.     Physical Exam/Data:   Vitals:   11/29/19 2100 11/29/19 2337 11/30/19 0500 11/30/19 0752  BP: 118/89 124/89 (!) 124/93 (!) 120/95  Pulse: 95 96 100 (!) 110  Resp: 20 19 19 20   Temp: 98.5 F (36.9 C) 98.3 F (36.8 C) 98 F (36.7 C) 98.1 F (36.7 C)  TempSrc: Oral Oral Oral Oral  SpO2: 98% 93% 92% 93%  Weight:   57.5 kg   Height:        Intake/Output Summary (Last 24 hours) at 11/30/2019 1134 Last data filed at 11/30/2019 0800 Gross per 24 hour  Intake 240 ml  Output 1280 ml  Net -1040 ml   Last 3 Weights 11/30/2019 11/29/2019 11/28/2019  Weight (lbs) 126 lb 12.2 oz 127 lb 6.4 oz 129 lb 6.6 oz  Weight (kg) 57.5 kg 57.788 kg 58.7 kg  Some encounter information is confidential and restricted. Go to Review Flowsheets activity to see all data.     Body mass index is 20.46 kg/m.  General:  Well nourished, thin, in no acute  distress HEENT: normal Lymph: no adenopathy Neck: no JVD Endocrine:  No thryomegaly Vascular: No carotid bruits; FA pulses 2+ bilaterally without bruits  Cardiac:  normal S1, S2; RRR; no murmur  Lungs:  clear to auscultation bilaterally, no wheezing, rhonchi or rales  Abd: soft, nontender, no hepatomegaly  Ext: no edema Musculoskeletal:  No deformities, BUE and BLE strength normal and equal Skin: warm and dry  Neuro:  CNs 2-12 intact, no focal abnormalities noted Psych:  Normal affect   EKG:  The EKG was personally reviewed and demonstrates:  NSR with mildly prolonged QTc   Telemetry:  Telemetry  was personally reviewed and demonstrates:  NSR, sinus tachycardia.   Relevant CV Studies: Echo: IMPRESSIONS    1. Left ventricular ejection fraction, by estimation, is 60 to 65%. The  left ventricle has normal function. The left ventricle has no regional  wall motion abnormalities. Left ventricular diastolic parameters are  consistent with Grade I diastolic  dysfunction (impaired relaxation).  2. Right ventricular systolic function is normal. The right ventricular  size is normal. Tricuspid regurgitation signal is inadequate for assessing  PA pressure.  3. The mitral valve is normal in structure. No evidence of mitral valve  regurgitation. No evidence of mitral stenosis.  4. The aortic valve is tricuspid. Aortic valve regurgitation is not  visualized. No aortic stenosis is present.  5. Aortic dilatation noted. There is mild dilatation of the aortic root,  measuring 37 mm.  6. The inferior vena cava is normal in size with greater than 50%  respiratory variability, suggesting right atrial pressure of 3 mmHg.   Laboratory Data:  High Sensitivity Troponin:   Recent Labs  Lab 11/28/19 1010 11/28/19 1242  TROPONINIHS 7 9     Chemistry Recent Labs  Lab 11/28/19 0957 11/28/19 1020 11/29/19 0213  NA 139 143 139  K 3.9 3.6 3.9  CL 106 105 107  CO2 21*  --  22  GLUCOSE  172* 154* 92  BUN 17 16 13   CREATININE 1.11 0.90 0.94  CALCIUM 8.1*  --  7.8*  GFRNONAA >60  --  >60  ANIONGAP 12  --  10    Recent Labs  Lab 11/28/19 0957 11/29/19 0213  PROT 5.7* 5.0*  ALBUMIN 3.2* 2.9*  AST 21 13*  ALT 14 12  ALKPHOS 100 88  BILITOT 0.8 1.0   Hematology Recent Labs  Lab 11/28/19 0957 11/28/19 1020 11/29/19 0213  WBC 12.4*  --  6.5  RBC 4.41  --  3.81*  HGB 13.2 12.2* 11.3*  HCT 40.6 36.0* 34.2*  MCV 92.1  --  89.8  MCH 29.9  --  29.7  MCHC 32.5  --  33.0  RDW 11.9  --  11.8  PLT 235  --  217   BNPNo results for input(s): BNP, PROBNP in the last 168 hours.  DDimer No results for input(s): DDIMER in the last 168 hours.   Radiology/Studies:  CT Head Wo Contrast  Result Date: 11/28/2019 CLINICAL DATA:  Delirium.  Neck trauma.  Syncope with collapse. EXAM: CT HEAD WITHOUT CONTRAST CT CERVICAL SPINE WITHOUT CONTRAST TECHNIQUE: Multidetector CT imaging of the head and cervical spine was performed following the standard protocol without intravenous contrast. Multiplanar CT image reconstructions of the cervical spine were also generated. COMPARISON:  None. FINDINGS: CT HEAD FINDINGS Brain: No evidence of acute large vascular territory infarction, hemorrhage, hydrocephalus, supratentorail extra-axial collection or mass lesion/mass effect. 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 No substantial mass effect. The fourth ventricle is widely patent. Vascular: No hyperdense vessel or unexpected calcification. Skull: No acute fracture. Sinuses/Orbits: Opacification of scattered ethmoid air cells with mucosal thickening. Frothy secretions in the right sphenoid sinus and retention cyst in the left sphenoid sinus. Mucosal thickening of the partially imaged maxillary sinuses. Other: No mastoid effusions. CT CERVICAL SPINE FINDINGS Alignment: Levocurvature. Trace retrolisthesis of C3 on C4, favor degenerative. Skull base  and vertebrae: No acute fracture. Vertebral body heights are maintained. Abnormal right C5-C6 and C6-C7 facet joints with incomplete fusion of the posterior elements at C5, paddle with  congenital/developmental segmentation anomaly. Soft tissues and spinal canal: No prevertebral fluid or swelling. No visible canal hematoma. Disc levels:  Mild multilevel degenerative disc disease. Upper chest: Further evaluated on concurrent CT of the chest. IMPRESSION: CT head: 1. No evidence of acute intracranial abnormality. 2. Paranasal sinus disease, which is nonspecific but can be seen with sinusitis. CT cervical spine: 1. No evidence of acute fracture or traumatic malalignment. 2. 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 Resulting lower cervical levocurvature. Electronically Signed   By: Margaretha Sheffield MD   On: 11/28/2019 11:33   CT Angio Chest PE W and/or Wo Contrast  Result Date: 11/28/2019 CLINICAL DATA:  Shortness of breath, syncope. EXAM: CT ANGIOGRAPHY CHEST WITH CONTRAST TECHNIQUE: Multidetector CT imaging of the chest was performed using the standard protocol during bolus administration of intravenous contrast. Multiplanar CT image reconstructions and MIPs were obtained to evaluate the vascular anatomy. CONTRAST:  51mL OMNIPAQUE IOHEXOL 350 MG/ML SOLN COMPARISON:  None. FINDINGS: Cardiovascular: Image quality is somewhat degraded by respiratory motion. Negative for pulmonary embolus. Vascular structures are unremarkable. Heart size normal. No pericardial effusion. Mediastinum/Nodes: No pathologically enlarged mediastinal lymph nodes. Right hilar lymph node measures 11 mm. No axillary adenopathy. Esophagus is grossly unremarkable. Lungs/Pleura: Image quality is degraded by respiratory motion. Dependent ground-glass in the right lower lobe. Mild ill-defined centrilobular nodularity, upper and  midlung zone predominant, likely due to smoking or vaping related respiratory bronchiolitis. A few millimetric pulmonary nodules measure up to 3 mm in the anterior left lower lobe (7/91). Moderate right pleural effusion. Airway is unremarkable. Upper Abdomen: Visualized portions of the liver, gallbladder, adrenal glands, kidneys, spleen, pancreas and stomach are grossly unremarkable. Musculoskeletal: There is an acute appearing nondisplaced fracture of the right fifth posterolateral rib. Additional healed fractures of mid and lower posterolateral right ribs. Review of the MIP images confirms the above findings. IMPRESSION: 1. Negative for pulmonary embolus. 2. Right lower lobe ground-glass may be due to pneumonia. Associated moderate right pleural effusion. 3. Borderline enlarged right hilar lymph node, likely reactive. Electronically Signed   By: Lorin Picket M.D.   On: 11/28/2019 11:27   CT Cervical Spine Wo Contrast  Result Date: 11/28/2019 CLINICAL DATA:  Delirium.  Neck trauma.  Syncope with collapse. EXAM: CT HEAD WITHOUT CONTRAST CT CERVICAL SPINE WITHOUT CONTRAST TECHNIQUE: Multidetector CT imaging of the head and cervical spine was performed following the standard protocol without intravenous contrast. Multiplanar CT image reconstructions of the cervical spine were also generated. COMPARISON:  None. FINDINGS: CT HEAD FINDINGS Brain: No evidence of acute large vascular territory infarction, hemorrhage, hydrocephalus, supratentorail extra-axial collection or mass lesion/mass effect. 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 No substantial mass effect. The fourth ventricle is widely patent. Vascular: No hyperdense vessel or unexpected calcification. Skull: No acute fracture. Sinuses/Orbits: Opacification of scattered ethmoid air cells with mucosal thickening. Frothy secretions in the right sphenoid sinus and retention cyst in the left sphenoid  sinus. Mucosal thickening of the partially imaged maxillary sinuses. Other: No mastoid effusions. CT CERVICAL SPINE FINDINGS Alignment: Levocurvature. Trace retrolisthesis of C3 on C4, favor degenerative. Skull base and vertebrae: No acute fracture. Vertebral body heights are maintained. Abnormal right C5-C6 and C6-C7 facet joints with incomplete fusion of the posterior elements at C5, paddle with congenital/developmental segmentation anomaly. Soft tissues and spinal canal: No prevertebral fluid or swelling. No visible canal hematoma. Disc levels:  Mild multilevel degenerative disc disease. Upper chest: Further evaluated on concurrent CT of the chest. IMPRESSION: CT head: 1. No evidence of acute intracranial abnormality. 2. Paranasal sinus disease, which is nonspecific but can be seen with sinusitis. CT cervical spine: 1. No evidence of acute fracture or traumatic malalignment. 2. 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 Resulting lower cervical levocurvature. Electronically Signed   By: Margaretha Sheffield MD   On: 11/28/2019 11:33   MR BRAIN WO CONTRAST  Result Date: 11/28/2019 CLINICAL DATA:  Neuro deficit, acute stroke suspected. EXAM: MRI HEAD WITHOUT CONTRAST TECHNIQUE: Multiplanar, multiecho pulse sequences of the brain and surrounding structures were obtained without intravenous contrast. COMPARISON:  None. FINDINGS: Brain: No acute infarction, hemorrhage, hydrocephalus, extra-axial collection or mass lesion. 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 The fourth ventricle is widely patent. Vascular: Major arterial flow voids are maintained at the skull base. Skull and upper cervical spine: Normal marrow signal. Sinuses/Orbits: Mucosal thickening of bilateral maxillary sinuses, scattered ethmoid air cells, and frontal sinuses. Right sphenoid sinus retention  cyst. Unremarkable orbits. IMPRESSION: No evidence of acute intracranial abnormality. Specifically, no acute infarct. Electronically Signed   By: Margaretha Sheffield MD   On: 11/28/2019 16:10   MR Cervical Spine Wo Contrast  Result Date: 11/28/2019 CLINICAL DATA:  Recent cardiac arrest with cardiopulmonary resuscitation. Neck trauma with focal neural deficit. EXAM: MRI CERVICAL SPINE WITHOUT CONTRAST TECHNIQUE: Multiplanar, multisequence MR imaging of the cervical spine was performed. No intravenous contrast was administered. COMPARISON:  CT cervical spine same date. FINDINGS: Alignment: Stable and near anatomic. Slight retrolisthesis at C3-4 and C4-5. Vertebrae: No evidence of acute fracture or traumatic subluxation. No paraspinal edema to suggest ligamentous injury. Cord: Normal in signal and caliber. 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 The craniocervical junction appears normal. There is mild mucosal thickening in the maxillary sinuses bilaterally. No paraspinal abnormalities are seen. Bilateral vertebral artery flow voids. Disc levels: C2-3: Normal interspace. C3-4: Mild disc bulging and uncinate spurring. No cord deformity or foraminal compromise. C4-5: Mild disc bulging with asymmetric uncinate spurring on the left contributing to mild left foraminal narrowing. No cord deformity. 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 Mild disc bulging and uncinate spurring contributing to mild right foraminal narrowing. No cord deformity. 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 Mild disc bulging and mild uncinate spurring contributing to mild left foraminal narrowing. No cord deformity. C7-T1: Normal interspace. IMPRESSION: 1. No acute findings or explanation for the patient's symptoms. No evidence of cord contusion or ligamentous injury. 2. Mild spondylosis with disc  bulging and uncinate spurring as described. There is mild foraminal narrowing as described. No cord deformity. 3. 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 Electronically Signed   By: Richardean Sale M.D.   On: 11/28/2019 16:41   DG Chest Port 1 View  Result Date: 11/28/2019 CLINICAL DATA:  Post CPR. Shortness of breath followed by unresponsiveness after taking normal dose of [place]. EXAM: PORTABLE CHEST 1 VIEW COMPARISON:  05/10/2017. FINDINGS: Trachea is midline. Heart size normal. Lungs are clear. There may be a tiny right pleural effusion. No pneumothorax. There are nondisplaced fractures of the right fourth through eighth posterolateral ribs. IMPRESSION: 1. Probable tiny right pleural effusion. 2. Nondisplaced fractures  involving the posterolateral right fourth through eighth ribs. Electronically Signed   By: Lorin Picket M.D.   On: 11/28/2019 10:14   ECHOCARDIOGRAM COMPLETE  Result Date: 11/29/2019    ECHOCARDIOGRAM REPORT   Patient Name:   MILES LEYDA Date of Exam: 11/29/2019 Medical Rec #:  [id number]     Height:       66.0 in Accession #:    [id number]    Weight:       127.4 lb Date of Birth:  May 01, 1975      BSA:          1.651 m Patient Age:    56 years      BP:           118/78 mmHg Patient Gender: M             HR:           115 bpm. Exam Location:  Inpatient Procedure: 2D Echo, Color Doppler and Cardiac Doppler Indications:    Cardiac Arrest i46.9  History:        Patient has no prior history of Echocardiogram examinations.  Sonographer:    Raquel Sarna Senior RDCS Referring Phys: [id number] DAN FLOYD IMPRESSIONS  1. Left ventricular ejection fraction, by estimation, is 60 to 65%. The left ventricle has normal function. The left ventricle has no regional wall motion abnormalities. Left ventricular diastolic parameters are consistent with Grade I diastolic dysfunction (impaired relaxation).  2. Right ventricular systolic function is normal. The right  ventricular size is normal. Tricuspid regurgitation signal is inadequate for assessing PA pressure.  3. The mitral valve is normal in structure. No evidence of mitral valve regurgitation. No evidence of mitral stenosis.  4. The aortic valve is tricuspid. Aortic valve regurgitation is not visualized. No aortic stenosis is present.  5. Aortic dilatation noted. There is mild dilatation of the aortic root, measuring 37 mm.  6. The inferior vena cava is normal in size with greater than 50% respiratory variability, suggesting right atrial pressure of 3 mmHg. FINDINGS  Left Ventricle: Left ventricular ejection fraction, by estimation, is 60 to 65%. The left ventricle has normal function. The left ventricle has no regional wall motion abnormalities. The left ventricular internal cavity size was normal in size. There is  no left ventricular hypertrophy. Left ventricular diastolic parameters are consistent with Grade I diastolic dysfunction (impaired relaxation). Right Ventricle: The right ventricular size is normal. No increase in right ventricular wall thickness. Right ventricular systolic function is normal. Tricuspid regurgitation signal is inadequate for assessing PA pressure. Left Atrium: Left atrial size was normal in size. Right Atrium: Right atrial size was normal in size. Pericardium: There is no evidence of pericardial effusion. Mitral Valve: The mitral valve is normal in structure. No evidence of mitral valve regurgitation. No evidence of mitral valve stenosis. Tricuspid Valve: The tricuspid valve is normal in structure. Tricuspid valve regurgitation is not demonstrated. Aortic Valve: The aortic valve is tricuspid. Aortic valve regurgitation is not visualized. No aortic stenosis is present. Pulmonic Valve: The pulmonic valve was normal in structure. Pulmonic valve regurgitation is not visualized. Aorta: Aortic dilatation noted. There is mild dilatation of the aortic root, measuring 37 mm. Venous: The inferior vena  cava is normal in size with greater than 50% respiratory variability, suggesting right atrial pressure of 3 mmHg. IAS/Shunts: No atrial level shunt detected by color flow Doppler.  LEFT VENTRICLE PLAX 2D LVIDd:         4.70 cm  Diastology LVIDs:  2.90 cm  LV e' medial:    8.58 cm/s LV PW:         0.70 cm  LV E/e' medial:  7.5 LV IVS:        0.80 cm  LV e' lateral:   12.10 cm/s LVOT diam:     2.00 cm  LV E/e' lateral: 5.3 LV SV:         68 LV SV Index:   41 LVOT Area:     3.14 cm  RIGHT VENTRICLE RV S prime:     15.90 cm/s TAPSE (M-mode): 2.2 cm LEFT ATRIUM             Index       RIGHT ATRIUM           Index LA diam:        2.70 cm 1.64 cm/m  RA Area:     12.70 cm LA Vol (A2C):   25.0 ml 15.14 ml/m RA Volume:   28.70 ml  17.38 ml/m LA Vol (A4C):   18.6 ml 11.27 ml/m LA Biplane Vol: 22.3 ml 13.51 ml/m  AORTIC VALVE LVOT Vmax:   140.00 cm/s LVOT Vmean:  88.600 cm/s LVOT VTI:    0.215 m  AORTA Ao Root diam: 3.70 cm Ao Asc diam:  3.20 cm MITRAL VALVE MV Area (PHT): 2.91 cm     SHUNTS MV Decel Time: 261 msec     Systemic VTI:  0.22 m MV E velocity: 64.00 cm/s   Systemic Diam: 2.00 cm MV A velocity: 115.00 cm/s MV E/A ratio:  0.56 Loralie Champagne MD Electronically signed by Loralie Champagne MD Signature Date/Time: 11/29/2019/4:56:45 PM    Final      Assessment and Plan:   1. Acute respiratory arrest. While his psychiatric medications can cause QT prolongation with potential for torsades des pointes it is very unlikely that this was the cause of his arrest. The fact that the AED advised no shock is also supportive of this. His Echo is normal. No arrhythmia seen on telemetry. His history is more consistent with respiratory depression. His family history is more concerning for cerebral aneurysm and not sudden cardiac death. At this point no further cardiac evaluation recommended. I would be concerned about resuming Invega infusions given events.              [facility] will sign off.   Medication  Recommendations:  none Other recommendations (labs, testing, etc):  none Follow up as an outpatient:  Not needed  For questions or updates, please contact [facility] Please consult [email or web address] for contact info under    Signed, Ziquan Fidel [place], MD  11/30/2019 11:34 AM

## 2019-11-30 NOTE — Progress Notes (Signed)
HD#2 Subjective:   No acute events overnight.  Patient states he is sore from where CPR was performed but denies chest pain, shortness of breath, fluttering in his chest. Patient has no complaints at this time. Discussed plan to have cardiology evaluate him, and he expressed understanding.  He has no other complaints or concerns at this time.  Objective:  Vital signs in last 24 hours: Vitals:   11/29/19 1723 11/29/19 2100 11/29/19 2337 11/30/19 0500  BP: 124/87 118/89 124/89 (!) 124/93  Pulse: 88 95 96 100  Resp: 20 20 19 19   Temp: 97.7 F (36.5 C) 98.5 F (36.9 C) 98.3 F (36.8 C) 98 F (36.7 C)  TempSrc: Oral Oral Oral Oral  SpO2: 96% 98% 93% 92%  Weight:    57.5 kg  Height:       Supplemental O2: Room Air SpO2: 92 % O2 Flow Rate (L/min): 1 L/min   Physical Exam:  Physical Exam Vitals and nursing note reviewed.  Constitutional:      General: He is not in acute distress.    Appearance: He is not ill-appearing, toxic-appearing or diaphoretic.  HENT:     Head: Normocephalic and atraumatic.  Cardiovascular:     Rate and Rhythm: Regular rhythm. Tachycardia present.     Heart sounds: No murmur heard.  No friction rub. No gallop.   Pulmonary:     Effort: Pulmonary effort is normal. No respiratory distress.     Breath sounds: No stridor. Wheezing present. No rhonchi or rales.  Abdominal:     General: Bowel sounds are normal.  Neurological:     General: No focal deficit present.     Mental Status: He is alert and oriented to person, place, and time. Mental status is at baseline.     Filed Weights   11/28/19 0948 11/29/19 0616 11/30/19 0500  Weight: 58.7 kg 57.8 kg 57.5 kg     Intake/Output Summary (Last 24 hours) at 11/30/2019 0740 Last data filed at 11/29/2019 1400 Gross per 24 hour  Intake 600 ml  Output 950 ml  Net -350 ml   Net IO Since Admission: 1,257.75 mL [11/30/19 0740]  Pertinent Labs: CBC Latest Ref Rng & Units 11/29/2019 11/28/2019  11/28/2019  WBC 4.0 - 10.5 K/uL 6.5 - 12.4(H)  Hemoglobin 13.0 - 17.0 g/dL 11.3(L) 12.2(L) 13.2  Hematocrit 39 - 52 % 34.2(L) 36.0(L) 40.6  Platelets 150 - 400 K/uL 217 - 235    CMP Latest Ref Rng & Units 11/29/2019 11/28/2019 11/28/2019  Glucose 70 - 99 mg/dL 92 154(H) 172(H)  BUN 6 - 20 mg/dL 13 16 17   Creatinine 0.61 - 1.24 mg/dL 0.94 0.90 1.11  Sodium 135 - 145 mmol/L 139 143 139  Potassium 3.5 - 5.1 mmol/L 3.9 3.6 3.9  Chloride 98 - 111 mmol/L 107 105 106  CO2 22 - 32 mmol/L 22 - 21(L)  Calcium 8.9 - 10.3 mg/dL 7.8(L) - 8.1(L)  Total Protein 6.5 - 8.1 g/dL 5.0(L) - 5.7(L)  Total Bilirubin 0.3 - 1.2 mg/dL 1.0 - 0.8  Alkaline Phos 38 - 126 U/L 88 - 100  AST 15 - 41 U/L 13(L) - 21  ALT 0 - 44 U/L 12 - 14    Imaging: ECHOCARDIOGRAM COMPLETE  Result Date: 11/29/2019    ECHOCARDIOGRAM REPORT   Patient Name:   Isaiah Black Date of Exam: 11/29/2019 Medical Rec #:  [id number]     Height:       66.0 in Accession #:  [id number]    Weight:       127.4 lb Date of Birth:  [birth year]      BSA:          1.651 m Patient Age:    73 years      BP:           118/78 mmHg Patient Gender: M             HR:           115 bpm. Exam Location:  Inpatient Procedure: 2D Echo, Color Doppler and Cardiac Doppler Indications:    Cardiac Arrest i46.9  History:        Patient has no prior history of Echocardiogram examinations.  Sonographer:    Raquel Sarna Senior RDCS Referring Phys: [id number] DAN FLOYD IMPRESSIONS  1. Left ventricular ejection fraction, by estimation, is 60 to 65%. The left ventricle has normal function. The left ventricle has no regional wall motion abnormalities. Left ventricular diastolic parameters are consistent with Grade I diastolic dysfunction (impaired relaxation).  2. Right ventricular systolic function is normal. The right ventricular size is normal. Tricuspid regurgitation signal is inadequate for assessing PA pressure.  3. The mitral valve is normal in structure. No evidence of mitral valve  regurgitation. No evidence of mitral stenosis.  4. The aortic valve is tricuspid. Aortic valve regurgitation is not visualized. No aortic stenosis is present.  5. Aortic dilatation noted. There is mild dilatation of the aortic root, measuring 37 mm.  6. The inferior vena cava is normal in size with greater than 50% respiratory variability, suggesting right atrial pressure of 3 mmHg. FINDINGS  Left Ventricle: Left ventricular ejection fraction, by estimation, is 60 to 65%. The left ventricle has normal function. The left ventricle has no regional wall motion abnormalities. The left ventricular internal cavity size was normal in size. There is  no left ventricular hypertrophy. Left ventricular diastolic parameters are consistent with Grade I diastolic dysfunction (impaired relaxation). Right Ventricle: The right ventricular size is normal. No increase in right ventricular wall thickness. Right ventricular systolic function is normal. Tricuspid regurgitation signal is inadequate for assessing PA pressure. Left Atrium: Left atrial size was normal in size. Right Atrium: Right atrial size was normal in size. Pericardium: There is no evidence of pericardial effusion. Mitral Valve: The mitral valve is normal in structure. No evidence of mitral valve regurgitation. No evidence of mitral valve stenosis. Tricuspid Valve: The tricuspid valve is normal in structure. Tricuspid valve regurgitation is not demonstrated. Aortic Valve: The aortic valve is tricuspid. Aortic valve regurgitation is not visualized. No aortic stenosis is present. Pulmonic Valve: The pulmonic valve was normal in structure. Pulmonic valve regurgitation is not visualized. Aorta: Aortic dilatation noted. There is mild dilatation of the aortic root, measuring 37 mm. Venous: The inferior vena cava is normal in size with greater than 50% respiratory variability, suggesting right atrial pressure of 3 mmHg. IAS/Shunts: No atrial level shunt detected by color flow  Doppler.  LEFT VENTRICLE PLAX 2D LVIDd:         4.70 cm  Diastology LVIDs:         2.90 cm  LV e' medial:    8.58 cm/s LV PW:         0.70 cm  LV E/e' medial:  7.5 LV IVS:        0.80 cm  LV e' lateral:   12.10 cm/s LVOT diam:     2.00 cm  LV E/e' lateral: 5.3 LV  SV:         68 LV SV Index:   41 LVOT Area:     3.14 cm  RIGHT VENTRICLE RV S prime:     15.90 cm/s TAPSE (M-mode): 2.2 cm LEFT ATRIUM             Index       RIGHT ATRIUM           Index LA diam:        2.70 cm 1.64 cm/m  RA Area:     12.70 cm LA Vol (A2C):   25.0 ml 15.14 ml/m RA Volume:   28.70 ml  17.38 ml/m LA Vol (A4C):   18.6 ml 11.27 ml/m LA Biplane Vol: 22.3 ml 13.51 ml/m  AORTIC VALVE LVOT Vmax:   140.00 cm/s LVOT Vmean:  88.600 cm/s LVOT VTI:    0.215 m  AORTA Ao Root diam: 3.70 cm Ao Asc diam:  3.20 cm MITRAL VALVE MV Area (PHT): 2.91 cm     SHUNTS MV Decel Time: 261 msec     Systemic VTI:  0.22 m MV E velocity: 64.00 cm/s   Systemic Diam: 2.00 cm MV A velocity: 115.00 cm/s MV E/A ratio:  0.56 Loralie Champagne MD Electronically signed by Loralie Champagne MD Signature Date/Time: 11/29/2019/4:56:45 PM    Final     Assessment/Plan:   Active Problems:   Cardiac arrest [facility])  Patient Summary: Isaiah Black is a 44 y.o. with a pertinent PMH of schizophrenia, who presented after syncopal/cardiac arrest event after Invega infusion and admitted for workup of his cardiac arrest episode.   Acute cardiac arrest vs. Syncope Patient with syncopal episode needing CPR after patient found pulseless. Suspect related to invega infusion, patient with no QT prolongation since admission. Received invega infusion in the past with no complications. Head imaging of CT, MRI negative for acute process at this time. Echo cardiogram 60-65% EF. LV normal function. Patient with family history of sudden cardiac death. -Cardiology consulted, appreciate their time and recommendations  -may discharge home today, pending card recs -continue telemetry -restarted  on psychiatric medications of citalopram and benztropine -UDS (-) -patient followed by Eulis Canner NP for psychiatry. Recommend holding invega injections at this time, receives every 30 days.  -patient denies having primary care provider, will request social work assist patient in this matter.  Tachycardic Patient continues to have episodes of tachycardia with PVC's. EKG's performed have been unremarkable aside from tachycardia, evidence of PVC's on telemetry. Continues to be asymptomatic. DDx includes inflammatory process - fractures that occurred yesterday. Will continue to monitor. -echo negative for acute process -cardiology consulted, consider starting beta blocker  Hx of Asthma Wheezing appreciated on exam today.  -Duoneb ordered q4h BID -incentive spirometer in context of rib fractures  Diet: Normal IVF: None,None VTE: Enoxaparin Code: Full PT/OT recs: None, none.  Dispo: Anticipated discharge to Home in 0-1 days pending cardiology consult  [facility] Resident PGY-1 Pager [phone] Please contact the on call pager after 5 pm and on weekends at [phone].

## 2019-11-30 NOTE — Progress Notes (Signed)
Report given to [facility], Therapist, sports. Patient is alert and oriented x4 resting in bed with call light in reach. VSS.  Cardiology still following and low dose beta blocker may need to be started for tachycardia. No labs this AM.

## 2019-12-01 NOTE — Discharge Summary (Signed)
Name: Aldair Rickel MRN: [id number] DOB: [birth year] 44 y.o. PCP: Patient, No Pcp Per  Date of Admission: 11/28/2019  9:43 AM Date of Discharge: 11/30/2019 Attending Physician: Dr. Angelia Mould  Discharge Diagnosis: Active Problems:   Cardiac arrest [facility])   Respiratory arrest [facility])   Discharge Medications: Allergies as of 11/30/2019   No Known Allergies     Medication List    STOP taking these medications   Invega Sustenna 156 MG/ML Susy injection Generic drug: paliperidone     TAKE these medications   albuterol 108 (90 Base) MCG/ACT inhaler Commonly known as: VENTOLIN HFA Inhale 2 puffs into the lungs every 6 (six) hours as needed for wheezing or shortness of breath.   benztropine 1 MG tablet Commonly known as: COGENTIN Take 1 tablet (1 mg total) by mouth 2 (two) times daily.   citalopram 20 MG tablet Commonly known as: CELEXA Take 1 tablet (20 mg total) by mouth daily. What changed: when to take this   traZODone 100 MG tablet Commonly known as: DESYREL Take 1 tablet (100 mg total) by mouth at bedtime as needed for sleep. What changed: when to take this       Disposition and follow-up:   Mr.Andie Lemay was discharged from [facility] in Stable condition.  At the hospital follow up visit please address:  1.  Follow-up:  A. Acute Cardiac Arrest v Syncope Secondary to Invega Injections  2.  Labs / imaging needed at time of follow-up: None  3.  Pending labs/ test needing follow-up: None  4.  Medication Changes  Started: None  Stopped: Invega Monthly Injections  Abx - None   Follow-up Appointments: Advised to follow up with psychiatrist. Patient had no PCP on file and TOC consult was placed prior to discharge to discuss potential PCP's with the patient  Hospital Course by problem list:  1.) Acute cardiac arrest vs. Syncope Secondary to Invega Injections Mr. Arabie presented to the ED after a syncopal/cardiac arrest event post invega  infusion (had received 4-5 times prior with no complications). The patient received chest compressions, however, no shockable rhythm was appreciated, thus no shock was given. No QT prolongation seen. Post CPR, the patient was found to have dysarthria and arm numbness. CT head was without acute findings. CTA chest without PE. Chest X-ray revealed non-displaced fractures of posterlateral right 4th-8th ribs. During his admission, the patient's only concern was posterior/lateral right sided chest pain due to his fractured ribs. Patient had few runs of tachycardia with PVC's, but no arrhythmia seen.   Paternal grandmother massive MI at 41, so cardiac workup was performed. Echocardiogram revealed mild aortic dilation and grade 1 diastolic dysfunction, but no structural disease. Patient seen by cardiology while admitted who did not believe event was cardiac in nature, but more so due to respiratory depression. Patient's psychiatry medications of citalopram and benztropine were restarted and patient remained asymptomatic.  Patient was discharged home and advised to follow up with his psychiatrist. Instructed patient to stop invega infusions until he follows up with his psychiatrist. Patient declined having a PCP so TOC met with patient and discussed PCP options.   Discharge Vitals:   BP 114/85 (BP Location: Left Arm)   Pulse 89   Temp 97.9 F (36.6 C) (Oral)   Resp 20   Ht _0  (1.676 m)   Wt 57.5 kg   SpO2 98%   BMI 20.46 kg/m   Pertinent Labs, Studies, and Procedures:  CBC Latest Ref Rng &  Units 11/29/2019 11/28/2019 11/28/2019  WBC 4.0 - 10.5 K/uL 6.5 - 12.4(H)  Hemoglobin 13.0 - 17.0 g/dL 11.3(L) 12.2(L) 13.2  Hematocrit 39 - 52 % 34.2(L) 36.0(L) 40.6  Platelets 150 - 400 K/uL 217 - 235    CMP Latest Ref Rng & Units 11/29/2019 11/28/2019 11/28/2019  Glucose 70 - 99 mg/dL 92 154(H) 172(H)  BUN 6 - 20 mg/dL _0 Creatinine 0.61 - 1.24 mg/dL 0.94 0.90 1.11  Sodium 135 - 145 mmol/L 139  143 139  Potassium 3.5 - 5.1 mmol/L 3.9 3.6 3.9  Chloride 98 - 111 mmol/L 107 105 106  CO2 22 - 32 mmol/L 22 - 21(L)  Calcium 8.9 - 10.3 mg/dL 7.8(L) - 8.1(L)  Total Protein 6.5 - 8.1 g/dL 5.0(L) - 5.7(L)  Total Bilirubin 0.3 - 1.2 mg/dL 1.0 - 0.8  Alkaline Phos 38 - 126 U/L 88 - 100  AST 15 - 41 U/L 13(L) - 21  ALT 0 - 44 U/L 12 - 14    CT Head Wo Contrast  Result Date: 11/28/2019 CLINICAL DATA:  Delirium.  Neck trauma.  Syncope with collapse. EXAM: CT HEAD WITHOUT CONTRAST CT CERVICAL SPINE WITHOUT CONTRAST TECHNIQUE: Multidetector CT imaging of the head and cervical spine was performed following the standard protocol without intravenous contrast. Multiplanar CT image reconstructions of the cervical spine were also generated. COMPARISON:  None. FINDINGS: CT HEAD FINDINGS Brain: No evidence of acute large vascular territory infarction, hemorrhage, hydrocephalus, supratentorail extra-axial collection or mass lesion/mass effect. 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 No substantial mass effect. The fourth ventricle is widely patent. Vascular: No hyperdense vessel or unexpected calcification. Skull: No acute fracture. Sinuses/Orbits: Opacification of scattered ethmoid air cells with mucosal thickening. Frothy secretions in the right sphenoid sinus and retention cyst in the left sphenoid sinus. Mucosal thickening of the partially imaged maxillary sinuses. Other: No mastoid effusions. CT CERVICAL SPINE FINDINGS Alignment: Levocurvature. Trace retrolisthesis of C3 on C4, favor degenerative. Skull base and vertebrae: No acute fracture. Vertebral body heights are maintained. Abnormal right C5-C6 and C6-C7 facet joints with incomplete fusion of the posterior elements at C5, paddle with congenital/developmental segmentation anomaly. Soft tissues and spinal canal: No prevertebral fluid or swelling. No visible canal hematoma. Disc levels:  Mild multilevel  degenerative disc disease. Upper chest: Further evaluated on concurrent CT of the chest. IMPRESSION: CT head: 1. No evidence of acute intracranial abnormality. 2. Paranasal sinus disease, which is nonspecific but can be seen with sinusitis. CT cervical spine: 1. No evidence of acute fracture or traumatic malalignment. 2. 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 Resulting lower cervical levocurvature. Electronically Signed   By: Margaretha Sheffield MD   On: 11/28/2019 11:33   CT Angio Chest PE W and/or Wo Contrast  Result Date: 11/28/2019 CLINICAL DATA:  Shortness of breath, syncope. EXAM: CT ANGIOGRAPHY CHEST WITH CONTRAST TECHNIQUE: Multidetector CT imaging of the chest was performed using the standard protocol during bolus administration of intravenous contrast. Multiplanar CT image reconstructions and MIPs were obtained to evaluate the vascular anatomy. CONTRAST:  89m OMNIPAQUE IOHEXOL 350 MG/ML SOLN COMPARISON:  None. FINDINGS: Cardiovascular: Image quality is somewhat degraded by respiratory motion. Negative for pulmonary embolus. Vascular structures are unremarkable. Heart size normal. No pericardial effusion. Mediastinum/Nodes: No pathologically enlarged mediastinal lymph nodes. Right hilar lymph node measures 11 mm. No axillary adenopathy. Esophagus is grossly  unremarkable. Lungs/Pleura: Image quality is degraded by respiratory motion. Dependent ground-glass in the right lower lobe. Mild ill-defined centrilobular nodularity, upper and midlung zone predominant, likely due to smoking or vaping related respiratory bronchiolitis. A few millimetric pulmonary nodules measure up to 3 mm in the anterior left lower lobe (7/91). Moderate right pleural effusion. Airway is unremarkable. Upper Abdomen: Visualized portions of the liver, gallbladder, adrenal glands, kidneys, spleen, pancreas and  stomach are grossly unremarkable. Musculoskeletal: There is an acute appearing nondisplaced fracture of the right fifth posterolateral rib. Additional healed fractures of mid and lower posterolateral right ribs. Review of the MIP images confirms the above findings. IMPRESSION: 1. Negative for pulmonary embolus. 2. Right lower lobe ground-glass may be due to pneumonia. Associated moderate right pleural effusion. 3. Borderline enlarged right hilar lymph node, likely reactive. Electronically Signed   By: Lorin Picket M.D.   On: 11/28/2019 11:27   CT Cervical Spine Wo Contrast  Result Date: 11/28/2019 CLINICAL DATA:  Delirium.  Neck trauma.  Syncope with collapse. EXAM: CT HEAD WITHOUT CONTRAST CT CERVICAL SPINE WITHOUT CONTRAST TECHNIQUE: Multidetector CT imaging of the head and cervical spine was performed following the standard protocol without intravenous contrast. Multiplanar CT image reconstructions of the cervical spine were also generated. COMPARISON:  None. FINDINGS: CT HEAD FINDINGS Brain: No evidence of acute large vascular territory infarction, hemorrhage, hydrocephalus, supratentorail extra-axial collection or mass lesion/mass effect. 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 No substantial mass effect. The fourth ventricle is widely patent. Vascular: No hyperdense vessel or unexpected calcification. Skull: No acute fracture. Sinuses/Orbits: Opacification of scattered ethmoid air cells with mucosal thickening. Frothy secretions in the right sphenoid sinus and retention cyst in the left sphenoid sinus. Mucosal thickening of the partially imaged maxillary sinuses. Other: No mastoid effusions. CT CERVICAL SPINE FINDINGS Alignment: Levocurvature. Trace retrolisthesis of C3 on C4, favor degenerative. Skull base and vertebrae: No acute fracture. Vertebral body heights are maintained. Abnormal right C5-C6 and C6-C7 facet joints with incomplete fusion  of the posterior elements at C5, paddle with congenital/developmental segmentation anomaly. Soft tissues and spinal canal: No prevertebral fluid or swelling. No visible canal hematoma. Disc levels:  Mild multilevel degenerative disc disease. Upper chest: Further evaluated on concurrent CT of the chest. IMPRESSION: CT head: 1. No evidence of acute intracranial abnormality. 2. Paranasal sinus disease, which is nonspecific but can be seen with sinusitis. CT cervical spine: 1. No evidence of acute fracture or traumatic malalignment. 2. 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 Resulting lower cervical levocurvature. Electronically Signed   By: Margaretha Sheffield MD   On: 11/28/2019 11:33   MR BRAIN WO CONTRAST  Result Date: 11/28/2019 CLINICAL DATA:  Neuro deficit, acute stroke suspected. EXAM: MRI HEAD WITHOUT CONTRAST TECHNIQUE: Multiplanar, multiecho pulse sequences of the brain and surrounding structures were obtained without intravenous contrast. COMPARISON:  None. FINDINGS: Brain: No acute infarction, hemorrhage, hydrocephalus, extra-axial collection or mass lesion. 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 The fourth ventricle is widely patent. Vascular: Major arterial flow voids are maintained at the skull base. Skull and upper cervical spine: Normal marrow signal. Sinuses/Orbits: Mucosal thickening of bilateral maxillary sinuses, scattered ethmoid air cells, and frontal sinuses. Right sphenoid sinus retention cyst. Unremarkable orbits. IMPRESSION: No evidence of acute intracranial abnormality. Specifically, no acute infarct. Electronically Signed   By: Margaretha Sheffield MD  On: 11/28/2019 16:10   MR Cervical Spine Wo Contrast  Result Date: 11/28/2019 CLINICAL DATA:  Recent cardiac arrest with cardiopulmonary resuscitation. Neck trauma with focal neural  deficit. EXAM: MRI CERVICAL SPINE WITHOUT CONTRAST TECHNIQUE: Multiplanar, multisequence MR imaging of the cervical spine was performed. No intravenous contrast was administered. COMPARISON:  CT cervical spine same date. FINDINGS: Alignment: Stable and near anatomic. Slight retrolisthesis at C3-4 and C4-5. Vertebrae: No evidence of acute fracture or traumatic subluxation. No paraspinal edema to suggest ligamentous injury. Cord: Normal in signal and caliber. 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 The craniocervical junction appears normal. There is mild mucosal thickening in the maxillary sinuses bilaterally. No paraspinal abnormalities are seen. Bilateral vertebral artery flow voids. Disc levels: C2-3: Normal interspace. C3-4: Mild disc bulging and uncinate spurring. No cord deformity or foraminal compromise. C4-5: Mild disc bulging with asymmetric uncinate spurring on the left contributing to mild left foraminal narrowing. No cord deformity. 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 Mild disc bulging and uncinate spurring contributing to mild right foraminal narrowing. No cord deformity. 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 Mild disc bulging and mild uncinate spurring contributing to mild left foraminal narrowing. No cord deformity. C7-T1: Normal interspace. IMPRESSION: 1. No acute findings or explanation for the patient's symptoms. No evidence of cord contusion or ligamentous injury. 2. Mild spondylosis with disc bulging and uncinate spurring as described. There is mild foraminal narrowing as described. No cord deformity. 3. 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 Electronically Signed   By: Richardean Sale M.D.   On: 11/28/2019 16:41   DG Chest Port 1 View  Result Date: 11/28/2019 CLINICAL DATA:  Post CPR. Shortness  of breath followed by unresponsiveness after taking normal dose of [place]. EXAM: PORTABLE CHEST 1 VIEW COMPARISON:  05/10/2017. FINDINGS: Trachea is midline. Heart size normal. Lungs are clear. There may be a tiny right pleural effusion. No pneumothorax. There are nondisplaced fractures of the right fourth through eighth posterolateral ribs. IMPRESSION: 1. Probable tiny right pleural effusion. 2. Nondisplaced fractures involving the posterolateral right fourth through eighth ribs. Electronically Signed   By: Lorin Picket M.D.   On: 11/28/2019 10:14   ECHOCARDIOGRAM COMPLETE  Result Date: 11/29/2019    ECHOCARDIOGRAM REPORT   Patient Name:   MAKAIL WATLING Date of Exam: 11/29/2019 Medical Rec #:  [id number]     Height:       66.0 in Accession #:    [id number]    Weight:       127.4 lb Date of Birth:  11/16/1975      BSA:          1.651 m Patient Age:    54 years      BP:           118/78 mmHg Patient Gender: M             HR:           115 bpm. Exam Location:  Inpatient Procedure: 2D Echo, Color Doppler and Cardiac Doppler Indications:    Cardiac Arrest i46.9  History:        Patient has no prior history of Echocardiogram examinations.  Sonographer:    Raquel Sarna Senior RDCS Referring Phys: [id number] DAN FLOYD IMPRESSIONS  1. Left ventricular ejection fraction, by estimation, is 60 to 65%. The left ventricle  has normal function. The left ventricle has no regional wall motion abnormalities. Left ventricular diastolic parameters are consistent with Grade I diastolic dysfunction (impaired relaxation).  2. Right ventricular systolic function is normal. The right ventricular size is normal. Tricuspid regurgitation signal is inadequate for assessing PA pressure.  3. The mitral valve is normal in structure. No evidence of mitral valve regurgitation. No evidence of mitral stenosis.  4. The aortic valve is tricuspid. Aortic valve regurgitation is not visualized. No aortic stenosis is present.  5. Aortic dilatation  noted. There is mild dilatation of the aortic root, measuring 37 mm.  6. The inferior vena cava is normal in size with greater than 50% respiratory variability, suggesting right atrial pressure of 3 mmHg. FINDINGS  Left Ventricle: Left ventricular ejection fraction, by estimation, is 60 to 65%. The left ventricle has normal function. The left ventricle has no regional wall motion abnormalities. The left ventricular internal cavity size was normal in size. There is  no left ventricular hypertrophy. Left ventricular diastolic parameters are consistent with Grade I diastolic dysfunction (impaired relaxation). Right Ventricle: The right ventricular size is normal. No increase in right ventricular wall thickness. Right ventricular systolic function is normal. Tricuspid regurgitation signal is inadequate for assessing PA pressure. Left Atrium: Left atrial size was normal in size. Right Atrium: Right atrial size was normal in size. Pericardium: There is no evidence of pericardial effusion. Mitral Valve: The mitral valve is normal in structure. No evidence of mitral valve regurgitation. No evidence of mitral valve stenosis. Tricuspid Valve: The tricuspid valve is normal in structure. Tricuspid valve regurgitation is not demonstrated. Aortic Valve: The aortic valve is tricuspid. Aortic valve regurgitation is not visualized. No aortic stenosis is present. Pulmonic Valve: The pulmonic valve was normal in structure. Pulmonic valve regurgitation is not visualized. Aorta: Aortic dilatation noted. There is mild dilatation of the aortic root, measuring 37 mm. Venous: The inferior vena cava is normal in size with greater than 50% respiratory variability, suggesting right atrial pressure of 3 mmHg. IAS/Shunts: No atrial level shunt detected by color flow Doppler.  LEFT VENTRICLE PLAX 2D LVIDd:         4.70 cm  Diastology LVIDs:         2.90 cm  LV e' medial:    8.58 cm/s LV PW:         0.70 cm  LV E/e' medial:  7.5 LV IVS:         0.80 cm  LV e' lateral:   12.10 cm/s LVOT diam:     2.00 cm  LV E/e' lateral: 5.3 LV SV:         68 LV SV Index:   41 LVOT Area:     3.14 cm  RIGHT VENTRICLE RV S prime:     15.90 cm/s TAPSE (M-mode): 2.2 cm LEFT ATRIUM             Index       RIGHT ATRIUM           Index LA diam:        2.70 cm 1.64 cm/m  RA Area:     12.70 cm LA Vol (A2C):   25.0 ml 15.14 ml/m RA Volume:   28.70 ml  17.38 ml/m LA Vol (A4C):   18.6 ml 11.27 ml/m LA Biplane Vol: 22.3 ml 13.51 ml/m  AORTIC VALVE LVOT Vmax:   140.00 cm/s LVOT Vmean:  88.600 cm/s LVOT VTI:    0.215 m  AORTA  Ao Root diam: 3.70 cm Ao Asc diam:  3.20 cm MITRAL VALVE MV Area (PHT): 2.91 cm     SHUNTS MV Decel Time: 261 msec     Systemic VTI:  0.22 m MV E velocity: 64.00 cm/s   Systemic Diam: 2.00 cm MV A velocity: 115.00 cm/s MV E/A ratio:  0.56 Loralie Champagne MD Electronically signed by Loralie Champagne MD Signature Date/Time: 11/29/2019/4:56:45 PM    Final      Discharge Instructions: Discharge Instructions    Call MD for:  difficulty breathing, headache or visual disturbances   Complete by: As directed    Call MD for:  extreme fatigue   Complete by: As directed    Call MD for:  persistant dizziness or light-headedness   Complete by: As directed    Call MD for:  persistant nausea and vomiting   Complete by: As directed    Call MD for:  redness, tenderness, or signs of infection (pain, swelling, redness, odor or green/yellow discharge around incision site)   Complete by: As directed    Call MD for:  severe uncontrolled pain   Complete by: As directed    Call MD for:  temperature >100.4   Complete by: As directed    Diet - low sodium heart healthy   Complete by: As directed    Increase activity slowly   Complete by: As directed       Signed: Riesa Pope, MD 12/01/2019, 5:22 PM   Pager: [phone]

## 2019-12-03 ENCOUNTER — Telehealth (HOSPITAL_COMMUNITY): Payer: Self-pay | Admitting: *Deleted

## 2019-12-03 NOTE — Telephone Encounter (Signed)
Called patients mom to check on his progress. Left message for her to call me back.

## 2019-12-10 ENCOUNTER — Telehealth (HOSPITAL_COMMUNITY): Payer: Self-pay | Admitting: *Deleted

## 2019-12-10 NOTE — Telephone Encounter (Signed)
Call from mom and also spoke with patient responding to the voice mail I left last week to check up on him and how he is doing. He spent two nights in the hospital and was discharged on Fri. States he is feeling well. States the dr couldn't find anything wrong with him so concluded he was allergic to his injection. He currently is not scheduled for a injection appt and his provider appt is the end of Dec. Scheduled him with Eulis Canner NP for 11/18 at 1030 to discuss medicines going forward. Mom made aware of this new appt. Also has therapy appt next week too.

## 2019-12-14 ENCOUNTER — Other Ambulatory Visit: Payer: Self-pay

## 2019-12-14 ENCOUNTER — Ambulatory Visit (INDEPENDENT_AMBULATORY_CARE_PROVIDER_SITE_OTHER): Payer: Medicaid Other | Admitting: Licensed Clinical Social Worker

## 2019-12-14 DIAGNOSIS — F200 Paranoid schizophrenia: Secondary | ICD-10-CM

## 2019-12-14 NOTE — Progress Notes (Signed)
   THERAPIST PROGRESS NOTE  Session Time: 70  Therapist Response:    Subjective/Objective:  Pt was alert and oriented x 5. He was dressed casually and engaged well in f/u therapy session. Pt presented today with euthymic mood/affect. Isaiah Black was cooperative and maintained good eye contact.   Pt came into session today with his mother. Primary stressor for Isaiah Black is illness. He had a reaction to a medication during his last visit for his injection. Pt needed CPR and EMS was called. The reason pt brought his mother back is he does not recall the events well. Isaiah Black states that his ribs were "cracked" from the CPR that was started. Other stressors reports were his ADLs which was reported by his mother. Mother states that he has not showered in two weeks since he was in the hospital. This has been a primary goal for pt improving his ADLs. LCSW utilized supportive and solution focused therapy to help guide pt through stressor. Pt states that he has been brushing his teeth daily and using deodorant daily as well. He states that he changes his socks every other day. Goal for next visit is 10 total showers in 4 weeks. Isaiah Black is to keep track of each day he showers by writing it down. He was agreeable to this assignment.    Assessment/Plan:  Pt endorse symptoms for auditory hallucinations, insomnia (trouble staying asleep), tension and worry. Currently pt is taking all his medications as prescribed to him. Plan for therapy is shower 10 times in next 30 days and to attend 2-3 peer support sessions in that same time frame. [name] will follow up with pt in 4 weeks.  Participation Level: Active  Behavioral Response: Casual and Fairly GroomedAlertAnxious  Type of Therapy: Individual Therapy  Treatment Goals addressed: Diagnosis: parnoid schizophrenia   Interventions: CBT and Supportive  Summary: Isaiah Black is a 44 y.o. male who presents with paranoid schizophrenia .   Suicidal/Homicidal: Nowithout  intent/plan  Therapist Response:   Plan: Return again in 4 weeks.     Isaiah Horn, LCSW 12/14/2019

## 2019-12-20 ENCOUNTER — Other Ambulatory Visit: Payer: Self-pay

## 2019-12-20 ENCOUNTER — Encounter (HOSPITAL_COMMUNITY): Payer: Self-pay | Admitting: Psychiatry

## 2019-12-20 ENCOUNTER — Ambulatory Visit (INDEPENDENT_AMBULATORY_CARE_PROVIDER_SITE_OTHER): Payer: Medicaid Other | Admitting: Psychiatry

## 2019-12-20 VITALS — BP 104/84 | HR 94 | Ht 66.0 in | Wt 126.0 lb

## 2019-12-20 DIAGNOSIS — F200 Paranoid schizophrenia: Secondary | ICD-10-CM | POA: Diagnosis not present

## 2019-12-20 DIAGNOSIS — F411 Generalized anxiety disorder: Secondary | ICD-10-CM | POA: Insufficient documentation

## 2019-12-20 DIAGNOSIS — F333 Major depressive disorder, recurrent, severe with psychotic symptoms: Secondary | ICD-10-CM | POA: Diagnosis not present

## 2019-12-20 MED ORDER — TRAZODONE HCL 100 MG PO TABS: 100.0000 mg | ORAL_TABLET | Freq: Every evening | ORAL | 2 refills | Status: DC | PRN

## 2019-12-20 MED ORDER — CITALOPRAM HYDROBROMIDE 40 MG PO TABS: 40.0000 mg | ORAL_TABLET | Freq: Every day | ORAL | 2 refills | Status: DC

## 2019-12-20 MED ORDER — BENZTROPINE MESYLATE 1 MG PO TABS: 1.0000 mg | ORAL_TABLET | Freq: Two times a day (BID) | ORAL | 2 refills | Status: DC

## 2019-12-20 MED ORDER — ARIPIPRAZOLE ER 400 MG IM PRSY: 400.0000 mg | PREFILLED_SYRINGE | Freq: Once | INTRAMUSCULAR | Status: DC

## 2019-12-20 NOTE — Progress Notes (Signed)
[place] MD/PA/NP OP Progress Note  12/20/2019 11:36 AM Isaiah Black  MRN:  [id number]  Chief Complaint: "Ive been more anxious and depressed" Chief Complaint    Medication Management      HPI: 73 old male seen today for follow-up psychiatric evaluation.  He has a psychiatric history of paranoid schizophrenia.  He is currently being managed on Cogentin 1 mg twice daily, and Celexa 20 mg daily, invega 156 monthly, and trazodone100 mg nightly . Patient reported that overall his medications are effective however noted that he has been having increased anxiety and depression.  Today he is well groomed, pleasant, cooperative, engaged in conversation, and maintained eye contact.  He describes his mood is anxious and depressed.  Provider conducted a GAD-7 and patient scored a 15.  Provider also conducted a PHQ-9 and patient scored a 22.  He noted that he constantly feels on edge, is restless, paces, excessively worries, and feels like something awful might happen.  He notes that these feelings may be exacerbated by him recently becoming sick due to respitory failure and aspiration pneumonia.  He noted that that when he arrived at the hospital many labs were drawn and he noted that the providers reported that they could not find a cause for his condition.  He noted that they informed him that he may be allergic to [facility].  He informed provider that he does not feel he is allergic to [facility] because he has been taking it for years.  Provider endorsed understanding.  He also endorses depressive symptoms such as anhedonia fatigue, poor concentration, and disturbed sleep.  Patient informed provider that he recently ran out of his trazodone and is waking up throughout the night.  He denies SI/HI.  He endorses VAH and notes that he occasionally hears chatter sees a figure at the corner of his eyes. He also endorses paranoia noting that he feels someone is out to get him.  Patient informed provider that he would like to  find a primary care physician.  Provider referred patient to [facility].  Patient was grateful.  He and his mother noted that they plan to get him a security necklace in case he ever goes unconscious again.  They note that his father and other paternal family members had similar incidents of respiratory distress that led to their deaths.  Patient is agreeable to discontinuing Invega due to potential adverse effects.  He is agreeable to start Abilify 5 mg oral tablet for 1 week and then increase to 10 mg tablets the next week.  He was given a three week sample of Abilify. He will follow-up with provider in 3 weeks to evaluate his tolerability to Abilify.  If the medication is tolerated patient will be started on Abilify 400 mg injection monthly.  He is also agreeable to increasing Celexa 20 mg to 40 mg to help manage symptoms of anxiety and depression. Potential side effects of medication and risks vs benefits of treatment vs non-treatment were explained and discussed. All questions were answered.  No other concerns noted at this time.  visit Diagnosis:    ICD-10-CM   1. Severe episode of recurrent major depressive disorder, with psychotic features ([place])  F33.3 citalopram (CELEXA) 40 MG tablet    traZODone (DESYREL) 100 MG tablet  2. Paranoid schizophrenia ([place])  F20.0 ARIPiprazole ER (ABILIFY MAINTENA) 400 MG prefilled syringe 400 mg    citalopram (CELEXA) 40 MG tablet    traZODone (DESYREL) 100 MG tablet  benztropine (COGENTIN) 1 MG tablet  3. Generalized anxiety disorder  F41.1 citalopram (CELEXA) 40 MG tablet    Past Psychiatric History: paranoid schizophrenia, anxiety, and disassociation disorder.   Past Medical History:  Past Medical History:  Diagnosis Date  . Anxiety   . Disassociation disorder   . Paranoid schizophrenia ([place])    History reviewed. No pertinent surgical history.  Family Psychiatric History: Paternal uncle schizophrenia, Father Bipolar 1, Maternal  aunt Bipolar 1, Niece depression    Family History: History reviewed. No pertinent family history.  Social History:  Social History   Socioeconomic History  . Marital status: Single    Spouse name: Not on file  . Number of children: 1  . Years of education: Not on file  . Highest education level: Not on file  Occupational History  . Not on file  Tobacco Use  . Smoking status: Former Research scientist (life sciences)  . Smokeless tobacco: Never Used  . Tobacco comment: vape  Vaping Use  . Vaping Use: Former  Substance and Sexual Activity  . Alcohol use: Yes    Comment: occ  . Drug use: Not Currently    Types: Marijuana    Comment: Pt stated "I was vaping the pens"  . Sexual activity: Yes  Other Topics Concern  . Not on file  Social History Narrative  . Not on file   Social Determinants of Health   Financial Resource Strain: High Risk  . Difficulty of Paying Living Expenses: Very hard  Food Insecurity:   . Worried About Charity fundraiser in the Last Year: Not on file  . Ran Out of Food in the Last Year: Not on file  Transportation Needs:   . Lack of Transportation (Medical): Not on file  . Lack of Transportation (Non-Medical): Not on file  Physical Activity:   . Days of Exercise per Week: Not on file  . Minutes of Exercise per Session: Not on file  Stress:   . Feeling of Stress : Not on file  Social Connections:   . Frequency of Communication with Friends and Family: Not on file  . Frequency of Social Gatherings with Friends and Family: Not on file  . Attends Religious Services: Not on file  . Active Member of Clubs or Organizations: Not on file  . Attends Archivist Meetings: Not on file  . Marital Status: Not on file    Allergies: No Known Allergies  Metabolic Disorder Labs: Lab Results  Component Value Date   HGBA1C 4.6 (L) 03/07/2019   MPG 85.32 03/07/2019   MPG 99.67 07/08/2017   Lab Results  Component Value Date   PROLACTIN 100.0 (H) 03/07/2019   PROLACTIN  51.1 (H) 07/08/2017   Lab Results  Component Value Date   CHOL 132 03/07/2019   TRIG 48 03/07/2019   HDL 42 03/07/2019   CHOLHDL 3.1 03/07/2019   VLDL 10 03/07/2019   LDLCALC 80 03/07/2019   LDLCALC 109 (H) 07/08/2017   Lab Results  Component Value Date   TSH 5.024 (H) 03/07/2019   TSH 6.137 (H) 07/08/2017    Therapeutic Level Labs: No results found for: LITHIUM No results found for: VALPROATE No components found for:  CBMZ  Current Medications: Current Outpatient Medications  Medication Sig Dispense Refill  . albuterol (VENTOLIN HFA) 108 (90 Base) MCG/ACT inhaler Inhale 2 puffs into the lungs every 6 (six) hours as needed for wheezing or shortness of breath.    . benztropine (COGENTIN) 1 MG tablet Take 1  tablet (1 mg total) by mouth 2 (two) times daily. 60 tablet 2  . citalopram (CELEXA) 40 MG tablet Take 1 tablet (40 mg total) by mouth daily. 30 tablet 2  . traZODone (DESYREL) 100 MG tablet Take 1 tablet (100 mg total) by mouth at bedtime as needed for sleep. 30 tablet 2   Current Facility-Administered Medications  Medication Dose Route Frequency Provider Last Rate Last Admin  . ARIPiprazole ER (ABILIFY MAINTENA) 400 MG prefilled syringe 400 mg  400 mg Intramuscular Once Eulis Canner E, NP         Musculoskeletal: Strength & Muscle Tone: within normal limits Gait & Station: normal Patient leans: N/A  Psychiatric Specialty Exam: Review of Systems  Blood pressure 104/84, pulse 94, height 5\' 6"  (1.676 m), weight 126 lb (57.2 kg), SpO2 96 %.Body mass index is 20.34 kg/m.  General Appearance: Well Groomed  Eye Contact:  Good  Speech:  Clear and Coherent and Normal Rate  Volume:  Normal  Mood:  Anxious and Depressed  Affect:  Congruent  Thought Process:  Coherent, Goal Directed and Linear  Orientation:  Full (Time, Place, and Person)  Thought Content: Logical and Hallucinations: Auditory Visual   Suicidal Thoughts:  No  Homicidal Thoughts:  No  Memory:   Immediate;   Good Recent;   Good Remote;   Good  Judgement:  Good  Insight:  Good  Psychomotor Activity:  Normal  Concentration:  Concentration: Good and Attention Span: Good  Recall:  Good  Fund of Knowledge: Good  Language: Good  Akathisia:  No  Handed:  Right  AIMS (if indicated): done  Assets:  Communication Skills Desire for Improvement Financial Resources/Insurance Housing Social Support  ADL's:  Intact  Cognition: WNL  Sleep:  Poor   Screenings: AIMS     Clinical Support from 10/31/2019 in [facility] Admission (Discharged) from 07/06/2017 in [place] [facility]  AIMS Total Score 5 0    AUDIT     Admission (Discharged) from 03/06/2019 in [place] Admission (Discharged) from 07/06/2017 in [place] [facility]  Alcohol Use Disorder Identification Test Final Score (AUDIT) 25 2    CAGE-AID     Counselor from 07/03/2019 in [place] Score 0    GAD-7     Clinical Support from 12/20/2019 in [facility]  Total GAD-7 Score 15    PHQ2-9     Clinical Support from 12/20/2019 in [facility]  PHQ-2 Total Score 5  PHQ-9 Total Score 22       Assessment and Plan: Patient endorses symptoms of anxiety, depression and VAH.  He is agreeable to discontinuing Invega due to potential adverse effects.  He is agreeable to start Abilify 5 mg oral tablet for 1 week and then increase to 10 mg tablets the next week.  He was given a three week sample of Abilify. He will follow-up with provider in 3 weeks to evaluate his tolerability to Abilify.  If the medication is tolerated patient will be started on Abilify 400 mg injection monthly.  He is also agreeable to increasing Celexa 20 mg to 40 mg to help manage symptoms of anxiety and depression  1. Paranoid schizophrenia ([place])  Start- ARIPiprazole ER  (ABILIFY MAINTENA) 400 MG prefilled syringe 400 mg Increased- citalopram (CELEXA) 40 MG tablet; Take 1 tablet (40 mg total) by mouth daily.  Dispense: 30 tablet; Refill:  2 Continue- traZODone (DESYREL) 100 MG tablet; Take 1 tablet (100 mg total) by mouth at bedtime as needed for sleep.  Dispense: 30 tablet; Refill: 2 Continue- benztropine (COGENTIN) 1 MG tablet; Take 1 tablet (1 mg total) by mouth 2 (two) times daily.  Dispense: 60 tablet; Refill: 2  2. Severe episode of recurrent major depressive disorder, with psychotic features ([place])  Increased- citalopram (CELEXA) 40 MG tablet; Take 1 tablet (40 mg total) by mouth daily.  Dispense: 30 tablet; Refill: 2 Continue- traZODone (DESYREL) 100 MG tablet; Take 1 tablet (100 mg total) by mouth at bedtime as needed for sleep.  Dispense: 30 tablet; Refill: 2  3. Generalized anxiety disorder  Increased- citalopram (CELEXA) 40 MG tablet; Take 1 tablet (40 mg total) by mouth daily.  Dispense: 30 tablet; Refill: 2   Follow-up in 3 weeks.    Salley Slaughter, NP 12/20/2019, 11:36 AM

## 2020-01-02 ENCOUNTER — Other Ambulatory Visit (HOSPITAL_COMMUNITY): Payer: Self-pay | Admitting: *Deleted

## 2020-01-02 ENCOUNTER — Telehealth (HOSPITAL_COMMUNITY): Payer: Self-pay | Admitting: *Deleted

## 2020-01-02 ENCOUNTER — Other Ambulatory Visit (HOSPITAL_COMMUNITY): Payer: Self-pay | Admitting: Psychiatry

## 2020-01-02 MED ORDER — ABILIFY MAINTENA 400 MG IM PRSY: 400.0000 mg | PREFILLED_SYRINGE | INTRAMUSCULAR | 11 refills | Status: DC

## 2020-01-02 MED ORDER — ABILIFY MAINTENA 400 MG IM PRSY
400.0000 mg | PREFILLED_SYRINGE | INTRAMUSCULAR | 11 refills | Status: DC
Start: 2020-01-02 — End: 2020-01-10

## 2020-01-02 MED ORDER — ARIPIPRAZOLE ER 400 MG IM PRSY
400.0000 mg | PREFILLED_SYRINGE | INTRAMUSCULAR | Status: AC
Start: 2020-01-03 — End: 2020-11-18
  Administered 2020-01-04 – 2020-11-18 (×12): 400 mg via INTRAMUSCULAR

## 2020-01-02 NOTE — Telephone Encounter (Signed)
Call from patient who has as injection appt tomorrow but his medicine is not at the pharmacy. Consulted with Eulis Canner NP for an order, put order in for him to be seen tomorrow.

## 2020-01-03 ENCOUNTER — Ambulatory Visit (HOSPITAL_COMMUNITY): Payer: Medicaid Other

## 2020-01-04 ENCOUNTER — Encounter (HOSPITAL_COMMUNITY): Payer: Self-pay

## 2020-01-04 ENCOUNTER — Ambulatory Visit (INDEPENDENT_AMBULATORY_CARE_PROVIDER_SITE_OTHER): Payer: Medicaid Other | Admitting: *Deleted

## 2020-01-04 ENCOUNTER — Other Ambulatory Visit: Payer: Self-pay

## 2020-01-04 VITALS — BP 114/85 | HR 114 | Ht 66.0 in | Wt 130.0 lb

## 2020-01-04 DIAGNOSIS — F333 Major depressive disorder, recurrent, severe with psychotic symptoms: Secondary | ICD-10-CM

## 2020-01-04 DIAGNOSIS — F200 Paranoid schizophrenia: Secondary | ICD-10-CM

## 2020-01-04 NOTE — Progress Notes (Signed)
Patient ID: Isaiah Black, male   DOB: 11-22-1975, 44 y.o.   MRN: [id number] In as scheduled along with his mom for his monthly injection. Today is his first day receiving Abilify after having a cardiac arrest post Invega injection approx one month ago. He had oral Abilify first and and tolerated it well. He is anxious this am before receiving the injection and pulse on arrival was 114. Approx 10 min after getting his injection it had dropped to 105. He made no complaints after receiving his shot. He and his mom state they dont think it was the [place] injection but nervous about getting it again, hence the change to Abilify. Appropriate denies any sx of his illness. Discussed holidays and decorating, mom very crafty and always has a lot to share re her passion. Conner showed me pictures of his daughter. To return in one month for next injection. TO call as needed.

## 2020-01-10 ENCOUNTER — Other Ambulatory Visit: Payer: Self-pay

## 2020-01-10 ENCOUNTER — Encounter (HOSPITAL_COMMUNITY): Payer: Self-pay | Admitting: Psychiatry

## 2020-01-10 ENCOUNTER — Ambulatory Visit (INDEPENDENT_AMBULATORY_CARE_PROVIDER_SITE_OTHER): Payer: Medicaid Other | Admitting: Psychiatry

## 2020-01-10 DIAGNOSIS — F200 Paranoid schizophrenia: Secondary | ICD-10-CM

## 2020-01-10 DIAGNOSIS — F333 Major depressive disorder, recurrent, severe with psychotic symptoms: Secondary | ICD-10-CM

## 2020-01-10 DIAGNOSIS — F411 Generalized anxiety disorder: Secondary | ICD-10-CM

## 2020-01-10 MED ORDER — CITALOPRAM HYDROBROMIDE 40 MG PO TABS: 40.0000 mg | ORAL_TABLET | Freq: Every day | ORAL | 2 refills | Status: DC

## 2020-01-10 MED ORDER — HYDROXYZINE HCL 10 MG PO TABS: 10.0000 mg | ORAL_TABLET | Freq: Three times a day (TID) | ORAL | 2 refills | Status: DC | PRN

## 2020-01-10 MED ORDER — ABILIFY MAINTENA 400 MG IM PRSY: 400.0000 mg | PREFILLED_SYRINGE | INTRAMUSCULAR | 11 refills | Status: DC

## 2020-01-10 MED ORDER — TRAZODONE HCL 100 MG PO TABS: 100.0000 mg | ORAL_TABLET | Freq: Every evening | ORAL | 2 refills | Status: DC | PRN

## 2020-01-10 MED ORDER — BENZTROPINE MESYLATE 1 MG PO TABS: 1.0000 mg | ORAL_TABLET | Freq: Two times a day (BID) | ORAL | 2 refills | Status: DC

## 2020-01-10 NOTE — Progress Notes (Signed)
[place] MD/PA/NP OP Progress Note  01/10/2020 10:46 AM Isaiah Black  MRN:  [id number]  Chief Complaint: "The voices are quitier on the abilify"   HPI: 88 old male seen today for follow-up psychiatric evaluation.  He has a psychiatric history of paranoid schizophrenia.  He is currently being managed on Cogentin 1 mg twice daily, and Celexa 40 mg daily,Abilify 400 mg monthly injection, and trazodone100 mg nightly . Patient reported that overall his medications are effective however noted that he has been having increased anxiety.  Today he is well groomed, pleasant, cooperative, engaged in conversation, and maintained eye contact.  He describes his mood is anxious.  Provider conducted a GAD-7 and patient scored a 20, at last visit he scored a 15.  Provider also conducted a PHQ-9 and patient scored a 19, at last visit he scored a 22.  He noted that he constantly worries about finding a place of his own and finances. He informed provider that he is waiting for his social security to be approved.  Today he denied SI/HI.  He endorses [facility] however noted that since being on Abilify his voices have become quieter.   Patient is agreeable to start hydroxyzine 10 mg 3 times daily as needed for anxiety. Potential side effects of medication and risks vs benefits of treatment vs non-treatment were explained and discussed. All questions were answered.  He will continue all other medications as prescribed no other concerns noted at this time.  visit Diagnosis:    ICD-10-CM   1. Paranoid schizophrenia ([place])  F20.0 ARIPiprazole ER (ABILIFY MAINTENA) 400 MG PRSY prefilled syringe    benztropine (COGENTIN) 1 MG tablet    citalopram (CELEXA) 40 MG tablet    traZODone (DESYREL) 100 MG tablet  2. Severe episode of recurrent major depressive disorder, with psychotic features ([place])  F33.3 citalopram (CELEXA) 40 MG tablet    traZODone (DESYREL) 100 MG tablet  3. Generalized anxiety disorder  F41.1 hydrOXYzine (ATARAX/VISTARIL)  10 MG tablet    citalopram (CELEXA) 40 MG tablet    Past Psychiatric History: paranoid schizophrenia, anxiety, and disassociation disorder.   Past Medical History:  Past Medical History:  Diagnosis Date  . Anxiety   . Disassociation disorder   . Paranoid schizophrenia ([place])    No past surgical history on file.  Family Psychiatric History: Paternal uncle schizophrenia, Father Bipolar 1, Maternal aunt Bipolar 1, Niece depression    Family History: No family history on file.  Social History:  Social History   Socioeconomic History  . Marital status: Single    Spouse name: Not on file  . Number of children: 1  . Years of education: Not on file  . Highest education level: Not on file  Occupational History  . Not on file  Tobacco Use  . Smoking status: Former Research scientist (life sciences)  . Smokeless tobacco: Never Used  . Tobacco comment: vape  Vaping Use  . Vaping Use: Former  Substance and Sexual Activity  . Alcohol use: Yes    Comment: occ  . Drug use: Not Currently    Types: Marijuana    Comment: Pt stated "I was vaping the pens"  . Sexual activity: Yes  Other Topics Concern  . Not on file  Social History Narrative  . Not on file   Social Determinants of Health   Financial Resource Strain: High Risk  . Difficulty of Paying Living Expenses: Very hard  Food Insecurity: Not on file  Transportation Needs: Not on file  Physical Activity: Not  on file  Stress: Not on file  Social Connections: Not on file    Allergies: No Known Allergies  Metabolic Disorder Labs: Lab Results  Component Value Date   HGBA1C 4.6 (L) 03/07/2019   MPG 85.32 03/07/2019   MPG 99.67 07/08/2017   Lab Results  Component Value Date   PROLACTIN 100.0 (H) 03/07/2019   PROLACTIN 51.1 (H) 07/08/2017   Lab Results  Component Value Date   CHOL 132 03/07/2019   TRIG 48 03/07/2019   HDL 42 03/07/2019   CHOLHDL 3.1 03/07/2019   VLDL 10 03/07/2019   LDLCALC 80 03/07/2019   LDLCALC 109 (H) 07/08/2017    Lab Results  Component Value Date   TSH 5.024 (H) 03/07/2019   TSH 6.137 (H) 07/08/2017    Therapeutic Level Labs: No results found for: LITHIUM No results found for: VALPROATE No components found for:  CBMZ  Current Medications: Current Outpatient Medications  Medication Sig Dispense Refill  . albuterol (VENTOLIN HFA) 108 (90 Base) MCG/ACT inhaler Inhale 2 puffs into the lungs every 6 (six) hours as needed for wheezing or shortness of breath.    . ARIPiprazole ER (ABILIFY MAINTENA) 400 MG PRSY prefilled syringe Inject 400 mg into the muscle every 28 (twenty-eight) days. 1 each 11  . benztropine (COGENTIN) 1 MG tablet Take 1 tablet (1 mg total) by mouth 2 (two) times daily. 60 tablet 2  . citalopram (CELEXA) 40 MG tablet Take 1 tablet (40 mg total) by mouth daily. 30 tablet 2  . hydrOXYzine (ATARAX/VISTARIL) 10 MG tablet Take 1 tablet (10 mg total) by mouth 3 (three) times daily as needed. 90 tablet 2  . traZODone (DESYREL) 100 MG tablet Take 1 tablet (100 mg total) by mouth at bedtime as needed for sleep. 30 tablet 2   Current Facility-Administered Medications  Medication Dose Route Frequency Provider Last Rate Last Admin  . ARIPiprazole ER (ABILIFY MAINTENA) 400 MG prefilled syringe 400 mg  400 mg Intramuscular Q28 days Eulis Canner E, NP   400 mg at 01/04/20 [id number]     Musculoskeletal: Strength & Muscle Tone: within normal limits Gait & Station: normal Patient leans: N/A  Psychiatric Specialty Exam: Review of Systems  Pulse 95, height 5\' 6"  (1.676 m), weight 130 lb (59 kg), SpO2 96 %.Body mass index is 20.98 kg/m.  General Appearance: Well Groomed  Eye Contact:  Good  Speech:  Clear and Coherent and Normal Rate  Volume:  Normal  Mood:  Anxious and Depressed  Affect:  Congruent  Thought Process:  Coherent, Goal Directed and Linear  Orientation:  Full (Time, Place, and Person)  Thought Content: Logical and Hallucinations: Auditory Visual   Suicidal Thoughts:  No   Homicidal Thoughts:  No  Memory:  Immediate;   Good Recent;   Good Remote;   Good  Judgement:  Good  Insight:  Good  Psychomotor Activity:  Normal  Concentration:  Concentration: Good and Attention Span: Good  Recall:  Good  Fund of Knowledge: Good  Language: Good  Akathisia:  No  Handed:  Right  AIMS (if indicated): done  Assets:  Communication Skills Desire for Improvement Financial Resources/Insurance Housing Social Support  ADL's:  Intact  Cognition: WNL  Sleep:  Fair   Screenings: AIMS   Flowsheet Row Clinical Support from 10/31/2019 in [facility] Admission (Discharged) from 07/06/2017 in [place] [facility]  AIMS Total Score 5 0    AUDIT   Flowsheet Row Admission (Discharged) from  03/06/2019 in [place] Admission (Discharged) from 07/06/2017 in [place] [facility]  Alcohol Use Disorder Identification Test Final Score (AUDIT) 25 2    CAGE-AID   Flowsheet Row Counselor from 07/03/2019 in [place] Score 0    GAD-7   Flowsheet Row Clinical Support from 01/10/2020 in [place] from 12/20/2019 in [facility]  Total GAD-7 Score 20 15    PHQ2-9   Flowsheet Row Clinical Support from 01/10/2020 in [place] from 12/20/2019 in [facility]  PHQ-2 Total Score 3 5  PHQ-9 Total Score 19 22       Assessment and Plan: Patient endorses symptoms of anxiety and depression anxiety. He noted that his AH has reduced since being on Abilify. He is agreeable to start hydroxyzine 10 mg three times daily as needed for anxiety. He will continue all other medications as prescribed.  1. Paranoid schizophrenia ([place])  Start- ARIPiprazole ER (ABILIFY MAINTENA) 400 MG prefilled syringe 400 mg Increased-  citalopram (CELEXA) 40 MG tablet; Take 1 tablet (40 mg total) by mouth daily.  Dispense: 30 tablet; Refill: 2 Continue- traZODone (DESYREL) 100 MG tablet; Take 1 tablet (100 mg total) by mouth at bedtime as needed for sleep.  Dispense: 30 tablet; Refill: 2 Continue- benztropine (COGENTIN) 1 MG tablet; Take 1 tablet (1 mg total) by mouth 2 (two) times daily.  Dispense: 60 tablet; Refill: 2  2. Severe episode of recurrent major depressive disorder, with psychotic features ([place])  Increased- citalopram (CELEXA) 40 MG tablet; Take 1 tablet (40 mg total) by mouth daily.  Dispense: 30 tablet; Refill: 2 Continue- traZODone (DESYREL) 100 MG tablet; Take 1 tablet (100 mg total) by mouth at bedtime as needed for sleep.  Dispense: 30 tablet; Refill: 2  3. Generalized anxiety disorder  Increased- citalopram (CELEXA) 40 MG tablet; Take 1 tablet (40 mg total) by mouth daily.  Dispense: 30 tablet; Refill: 2 Start- hydrOXYzine (ATARAX/VISTARIL) 10 MG tablet; Take 1 tablet (10 mg total) by mouth 3 (three) times daily as needed.  Dispense: 90 tablet; Refill: 2   Follow-up in 3 weeks. Follow up with therapy      Salley Slaughter, NP 01/10/2020, 10:46 AM

## 2020-01-15 ENCOUNTER — Ambulatory Visit (INDEPENDENT_AMBULATORY_CARE_PROVIDER_SITE_OTHER): Payer: Medicaid Other | Admitting: Licensed Clinical Social Worker

## 2020-01-15 ENCOUNTER — Other Ambulatory Visit: Payer: Self-pay

## 2020-01-15 DIAGNOSIS — F200 Paranoid schizophrenia: Secondary | ICD-10-CM | POA: Diagnosis not present

## 2020-01-15 NOTE — Progress Notes (Signed)
° °  THERAPIST PROGRESS NOTE  Session Time: 30  Therapist Response:   Subjective/Objective:  Pt was alert and oriented x 5. He was dressed casually and groomed well. He presented with anxious mood/affect. Isaiah Black was cooperative and maintained good eye contact.   Pt primary stressor today was family conflict. Isaiah Black was accused of sexual misconduct by his niece which he took a plea deal for 3 years ago. Pt was released last January. His sister never talked to him as the person that accused Isaiah Black of the crime was her daughter. Over Thanksgiving his sister asked to speak with him which she has not done since he was released. Sister apologized to Isaiah Black and believes her daughter was not being honest. Per Isaiah Black his niece has been spreading other lies about different people now which has led people to question the validity behind the stories. Isaiah Black and his sister are now on speaking terms.   Isaiah Black also reports his niece has gone to social media to speak about him. Isaiah Black does not have access to social media, but his family does and showed him what she was saying. Isaiah Black states this made him angry. LCSW used supportive therapy to help process through emotions he was feeling.   Pt does report some good news. He states his SSDI is looking like it will get approved which he has been waiting on for the last 6 months. He also states that his hygiene goals are coming along reporting that he changed his socks 7 out of the goal 10 times. He is also showering 2 times per week. Isaiah Black continues to make progress through chores stating he is now responsible for feeding the dogs, cleaning the bathroom, and washing dishes.    Assessment/Plan:  Pt endorses symptoms for paranoia, AH, tension and worry. He currently meets criteria for paranoid schizophrenia. Pt has recently been switched to abilify injections and states that voices have decline and become "quieter". Plan moving forward is to change socks 10 times in the next 4 weeks and  keep track of progress by writing it down.     Participation Level: Active  Behavioral Response: Casual and Fairly GroomedAlertAnxious  Type of Therapy: Individual Therapy  Treatment Goals addressed: Diagnosis: parnoid schziophrenia   Interventions: CBT and Supportive  Summary: Isaiah Black is a 44 y.o. male who presents with paranoid schizophrenia .   Suicidal/Homicidal: NAwithout intent/plan    Plan: Return again in 4 weeks.      Dory Horn, LCSW 01/15/2020

## 2020-01-30 ENCOUNTER — Encounter (HOSPITAL_COMMUNITY): Payer: Medicaid Other | Admitting: Psychiatry

## 2020-02-04 ENCOUNTER — Ambulatory Visit (HOSPITAL_COMMUNITY): Payer: Medicaid Other

## 2020-02-05 ENCOUNTER — Encounter (HOSPITAL_COMMUNITY): Payer: Self-pay

## 2020-02-05 ENCOUNTER — Ambulatory Visit (HOSPITAL_COMMUNITY): Payer: Medicaid Other | Admitting: *Deleted

## 2020-02-05 ENCOUNTER — Other Ambulatory Visit: Payer: Self-pay

## 2020-02-05 VITALS — BP 129/87 | HR 113 | Ht 66.0 in | Wt 130.0 lb

## 2020-02-05 DIAGNOSIS — F200 Paranoid schizophrenia: Secondary | ICD-10-CM

## 2020-02-05 NOTE — Progress Notes (Signed)
In one day late, canceled himself yesterday due to bad weather. He received Abilify Maintena 400 mg IM in L deltoid. He always wants his injection in his L arm because he is right handed and its his preference. He reported having a nice holiday, showed Clinical research associate some of the things he got. Mom unable to come with him to his appt today as she had an appt of her own. He offers no complaints. His pulse elevated this am as it often is, he relates it to walking here and is anxious on arrival. BP normal. To return in one month for his next injection.

## 2020-02-12 ENCOUNTER — Other Ambulatory Visit: Payer: Self-pay

## 2020-02-12 ENCOUNTER — Ambulatory Visit (INDEPENDENT_AMBULATORY_CARE_PROVIDER_SITE_OTHER): Payer: Medicaid Other | Admitting: Licensed Clinical Social Worker

## 2020-02-12 DIAGNOSIS — F200 Paranoid schizophrenia: Secondary | ICD-10-CM | POA: Diagnosis not present
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
## 2020-03-04 ENCOUNTER — Other Ambulatory Visit: Payer: Self-pay

## 2020-03-04 ENCOUNTER — Encounter (HOSPITAL_COMMUNITY): Payer: Self-pay

## 2020-03-04 ENCOUNTER — Ambulatory Visit (HOSPITAL_COMMUNITY): Payer: Medicaid Other | Admitting: *Deleted

## 2020-03-04 VITALS — BP 113/70 | HR 106 | Ht 66.0 in | Wt 132.0 lb

## 2020-03-04 DIAGNOSIS — F200 Paranoid schizophrenia: Secondary | ICD-10-CM

## 2020-03-04 NOTE — Progress Notes (Signed)
In with mom for his monthly Abilify shot.He received 400 mg of maintena in L deltoid. He always requests his shot be given in his L arm. Shot given without any difficulty. Pleasant and denies any issues and offers no complaints. Mom had craft pictures to show me as she does every visit and today, for the first time Isaiah Black has started working on pictures done with gems and he showed me pictures of what he was working on. To return in one month for both a med check appt with provider and also his next injection.

## 2020-03-10 ENCOUNTER — Ambulatory Visit (HOSPITAL_COMMUNITY): Payer: Medicaid Other | Admitting: Licensed Clinical Social Worker

## 2020-03-12 ENCOUNTER — Other Ambulatory Visit: Payer: Self-pay

## 2020-03-12 ENCOUNTER — Encounter (HOSPITAL_COMMUNITY): Payer: Self-pay | Admitting: Licensed Clinical Social Worker

## 2020-03-12 ENCOUNTER — Ambulatory Visit (INDEPENDENT_AMBULATORY_CARE_PROVIDER_SITE_OTHER): Payer: Medicaid Other | Admitting: Licensed Clinical Social Worker

## 2020-03-12 DIAGNOSIS — F200 Paranoid schizophrenia: Secondary | ICD-10-CM | POA: Diagnosis not present

## 2020-03-12 DIAGNOSIS — F411 Generalized anxiety disorder: Secondary | ICD-10-CM

## 2020-03-12 DIAGNOSIS — F333 Major depressive disorder, recurrent, severe with psychotic symptoms: Secondary | ICD-10-CM

## 2020-03-12 NOTE — Progress Notes (Signed)
   THERAPIST PROGRESS NOTE  Session Time: 86  Participation Level: Active  Behavioral Response: CasualAlertAnxious  Type of Therapy: Individual Therapy  Treatment Goals addressed: Diagnosis: Paranoid schizophrenia, MDD, GAD.   Interventions: CBT, Solution Focused and Social Skills Training  Summary: Isaiah Black is a 45 y.o. male who presents with paranoids schizophrenia, GAD, and MDD.   Suicidal/Homicidal: Yeswithout intent/plan  Therapist Response:    Pt was alert and oriented x 5. He was dressed casually and engaged well in therapy session. Isaiah Black presented today with his mother. He had an anxious mood/affect as evidence by leg restlessness. Pt maintained good eye contact and was cooperative.   Pt stated primary stressor today was financial. He was denied SSDI even after being recommended by their provider that pt was disabled. Isaiah Black next steps is to appeal the decision. He is working with a Engineer, agricultural from [employer] court system and legal aid to help with appeal process.   Plan/Intervention: Pt had goals of changing his socks 6 x per week. He is currently changing them 4 x weekly. He also was to do 3 chores per week. He currently reports doing dishes, taking out trash and feeding the dogs. Pt also wants to shower 3 x weekly. Currently pt is only showering 1 x weekly. New goal is to shower M, W, F to help pt remember days of the week were added. Isaiah Black also agreeable to attempt 1 peer support group monthly start in Feb 2022.    Assessment: Pt endorse symptoms for auditory hallucinations, paranoia, tension, worry, sadness, hopelessness, restlessness and insomnia. He does meet criteria for paranoid schizophrenia, GAD and MDD. He is complaint with his monthly injections and takes his medication as directed.  Plan: Return again in 4   weeks.     Dory Horn, LCSW 03/12/2020

## 2020-04-02 ENCOUNTER — Other Ambulatory Visit: Payer: Self-pay

## 2020-04-02 ENCOUNTER — Ambulatory Visit (HOSPITAL_COMMUNITY): Payer: Medicaid Other | Admitting: *Deleted

## 2020-04-02 ENCOUNTER — Encounter (HOSPITAL_COMMUNITY): Payer: Self-pay

## 2020-04-02 ENCOUNTER — Encounter (HOSPITAL_COMMUNITY): Payer: Self-pay | Admitting: Psychiatry

## 2020-04-02 ENCOUNTER — Ambulatory Visit (INDEPENDENT_AMBULATORY_CARE_PROVIDER_SITE_OTHER): Payer: Medicaid Other | Admitting: Psychiatry

## 2020-04-02 VITALS — BP 131/92 | HR 114 | Ht 66.0 in | Wt 134.0 lb

## 2020-04-02 DIAGNOSIS — F411 Generalized anxiety disorder: Secondary | ICD-10-CM | POA: Diagnosis not present

## 2020-04-02 DIAGNOSIS — F200 Paranoid schizophrenia: Secondary | ICD-10-CM | POA: Diagnosis not present

## 2020-04-02 DIAGNOSIS — F333 Major depressive disorder, recurrent, severe with psychotic symptoms: Secondary | ICD-10-CM | POA: Diagnosis not present

## 2020-04-02 MED ORDER — BUSPIRONE HCL 10 MG PO TABS: 10.0000 mg | ORAL_TABLET | Freq: Every day | ORAL | 2 refills | Status: DC

## 2020-04-02 MED ORDER — TRAZODONE HCL 100 MG PO TABS: 100.0000 mg | ORAL_TABLET | Freq: Every evening | ORAL | 2 refills | Status: DC | PRN

## 2020-04-02 MED ORDER — HYDROXYZINE HCL 25 MG PO TABS: 25.0000 mg | ORAL_TABLET | Freq: Three times a day (TID) | ORAL | 2 refills | Status: DC | PRN

## 2020-04-02 MED ORDER — DULOXETINE HCL 60 MG PO CPEP: 60.0000 mg | ORAL_CAPSULE | Freq: Every day | ORAL | 2 refills | Status: DC

## 2020-04-02 MED ORDER — ABILIFY MAINTENA 400 MG IM PRSY: 400.0000 mg | PREFILLED_SYRINGE | INTRAMUSCULAR | 11 refills | Status: DC

## 2020-04-02 NOTE — Progress Notes (Signed)
In accompanied by his mom for monthly injection and also to meet with Dr Ronne Binning for med check. He announced on arrival he finally got his social security check, paid his mom some money from his back pay and has 600 + dollars monthly. Next goal is to find housing. He does also receive food stamps and medicaid. He is working with a re entry Writer gave him number to the housing authority for his housing to be affordable. He is doing well, offers no complaints. Both he and mom talkative today re art projects they are both working on. Today he got his Abilify 400 mg injection in his L deltoid without difficulty. He saw Dr Ronne Binning after we saw him and gave him his shot. To return in one month for next injection.

## 2020-04-02 NOTE — Progress Notes (Addendum)
BH MD/PA/NP OP Progress Note  04/07/2020 8:57 AM Isaiah Black  MRN:  [id number]  Chief Complaint: "I am anxious everyday"   HPI: 69 old male seen today for follow-up psychiatric evaluation.  He has a psychiatric history of paranoid schizophrenia.  He is currently being managed on Cogentin 1 mg twice daily, and Celexa 40 mg daily,Abilify 400 mg monthly injection, and trazodone100 mg nightly . Patient reported that overall his medications are effective however noted that he has been having increased anxiety.  Today he is well groomed, pleasant, cooperative, engaged in conversation, and maintained eye contact.  He informed provider that most days he is anxious and depressed.  He notes recently he was on edge about getting his Social Security check.  He notes that it was just restarted.  He informed provider that his anxiety makes him very restless and notes that at times he shakes.  Provider conducted a GAD-7 and patient scored 21, at his last visit he scored a 32.  Provider also conducted a PHQ-9 and patient scored a 21, at his last visit he scored a 22. Today he denied SI/HI.  He endorses [facility] however noted that they are better than they were and notes that he does not hear them all the time.  Patient also endorsed paranoia.  Notes that his appetite is poor and endorses adequate sleep.  Today he is agreeable to increasing hydroxyzine 10 mg 3 times daily as needed to 25 mg 3 times a day to help manage anxiety.    He will start BuSpar 10 mg 3 times daily.  Potential side effects of medication and risks vs benefits of treatment vs non-treatment were explained and discussed. All questions were answered.  He will continue all other medications as prescribed no other concerns noted at this time.  visit Diagnosis:    ICD-10-CM   1. Paranoid schizophrenia ([place])  F20.0 traZODone (DESYREL) 100 MG tablet    ARIPiprazole ER (ABILIFY MAINTENA) 400 MG PRSY prefilled syringe  2. Severe episode of recurrent major  depressive disorder, with psychotic features ([place])  F33.3 traZODone (DESYREL) 100 MG tablet    DISCONTINUED: DULoxetine (CYMBALTA) 60 MG capsule    DISCONTINUED: busPIRone (BUSPAR) 10 MG tablet  3. Generalized anxiety disorder  F41.1 hydrOXYzine (ATARAX/VISTARIL) 25 MG tablet    DISCONTINUED: DULoxetine (CYMBALTA) 60 MG capsule    DISCONTINUED: busPIRone (BUSPAR) 10 MG tablet    Past Psychiatric History: paranoid schizophrenia, anxiety, and disassociation disorder.   Past Medical History:  Past Medical History:  Diagnosis Date  . Anxiety   . Disassociation disorder   . Paranoid schizophrenia ([place])    History reviewed. No pertinent surgical history.  Family Psychiatric History: Paternal uncle schizophrenia, Father Bipolar 1, Maternal aunt Bipolar 1, Niece depression    Family History: History reviewed. No pertinent family history.  Social History:  Social History   Socioeconomic History  . Marital status: Single    Spouse name: Not on file  . Number of children: 1  . Years of education: Not on file  . Highest education level: Not on file  Occupational History  . Not on file  Tobacco Use  . Smoking status: Former Research scientist (life sciences)  . Smokeless tobacco: Never Used  Vaping Use  . Vaping Use: Former  Substance and Sexual Activity  . Alcohol use: Not Currently    Comment: occ  . Drug use: Not Currently  . Sexual activity: Yes  Other Topics Concern  . Not on file  Social History Narrative  .  Not on file   Social Determinants of Health   Financial Resource Strain: High Risk  . Difficulty of Paying Living Expenses: Hard  Food Insecurity: No Food Insecurity  . Worried About Charity fundraiser in the Last Year: Never true  . Ran Out of Food in the Last Year: Never true  Transportation Needs: No Transportation Needs  . Lack of Transportation (Medical): No  . Lack of Transportation (Non-Medical): No  Physical Activity: Insufficiently Active  . Days of Exercise per Week: 2 days   . Minutes of Exercise per Session: 50 min  Stress: Stress Concern Present  . Feeling of Stress : Very much  Social Connections: Socially Isolated  . Frequency of Communication with Friends and Family: More than three times a week  . Frequency of Social Gatherings with Friends and Family: Once a week  . Attends Religious Services: Never  . Active Member of Clubs or Organizations: No  . Attends Archivist Meetings: Never  . Marital Status: Never married    Allergies: No Known Allergies  Metabolic Disorder Labs: Lab Results  Component Value Date   HGBA1C 4.6 (L) 03/07/2019   MPG 85.32 03/07/2019   MPG 99.67 07/08/2017   Lab Results  Component Value Date   PROLACTIN 100.0 (H) 03/07/2019   PROLACTIN 51.1 (H) 07/08/2017   Lab Results  Component Value Date   CHOL 132 03/07/2019   TRIG 48 03/07/2019   HDL 42 03/07/2019   CHOLHDL 3.1 03/07/2019   VLDL 10 03/07/2019   LDLCALC 80 03/07/2019   LDLCALC 109 (H) 07/08/2017   Lab Results  Component Value Date   TSH 5.024 (H) 03/07/2019   TSH 6.137 (H) 07/08/2017    Therapeutic Level Labs: No results found for: LITHIUM No results found for: VALPROATE No components found for:  CBMZ  Current Medications: Current Outpatient Medications  Medication Sig Dispense Refill  . albuterol (VENTOLIN HFA) 108 (90 Base) MCG/ACT inhaler Inhale 2 puffs into the lungs every 6 (six) hours as needed for wheezing or shortness of breath.    . ARIPiprazole ER (ABILIFY MAINTENA) 400 MG PRSY prefilled syringe Inject 400 mg into the muscle every 28 (twenty-eight) days. 1 each 11  . benztropine (COGENTIN) 1 MG tablet Take 1 tablet (1 mg total) by mouth 2 (two) times daily. 60 tablet 2  . busPIRone (BUSPAR) 15 MG tablet Take 1 tablet (15 mg total) by mouth daily. 90 tablet 2  . citalopram (CELEXA) 40 MG tablet Take 1 tablet (40 mg total) by mouth daily. 30 tablet 2  . hydrOXYzine (ATARAX/VISTARIL) 25 MG tablet Take 1 tablet (25 mg total) by  mouth 3 (three) times daily as needed. 90 tablet 2  . traZODone (DESYREL) 100 MG tablet Take 1 tablet (100 mg total) by mouth at bedtime as needed for sleep. 30 tablet 2   Current Facility-Administered Medications  Medication Dose Route Frequency Provider Last Rate Last Admin  . ARIPiprazole ER (ABILIFY MAINTENA) 400 MG prefilled syringe 400 mg  400 mg Intramuscular Q28 days Eulis Canner E, NP   400 mg at 04/02/20 9480     Musculoskeletal: Strength & Muscle Tone: within normal limits Gait & Station: normal Patient leans: N/A  Psychiatric Specialty Exam: Review of Systems  There were no vitals taken for this visit.There is no height or weight on file to calculate BMI.  General Appearance: Well Groomed  Eye Contact:  Good  Speech:  Clear and Coherent and Normal Rate  Volume:  Normal  Mood:  Anxious and Depressed  Affect:  Congruent  Thought Process:  Coherent, Goal Directed and Linear  Orientation:  Full (Time, Place, and Person)  Thought Content: Logical and Hallucinations: Auditory Visual   Suicidal Thoughts:  No  Homicidal Thoughts:  No  Memory:  Immediate;   Good Recent;   Good Remote;   Good  Judgement:  Good  Insight:  Good  Psychomotor Activity:  Normal  Concentration:  Concentration: Good and Attention Span: Good  Recall:  Good  Fund of Knowledge: Good  Language: Good  Akathisia:  No  Handed:  Right  AIMS (if indicated): done  Assets:  Communication Skills Desire for Improvement Financial Resources/Insurance Housing Social Support  ADL's:  Intact  Cognition: WNL  Sleep:  Good   Screenings: AIMS   Flowsheet Row Clinical Support from 10/31/2019 in [facility] Admission (Discharged) from 07/06/2017 in [place] [facility]  AIMS Total Score 5 0    AUDIT   Flowsheet Row Admission (Discharged) from 03/06/2019 in [place] Admission (Discharged) from 07/06/2017 in [place] [facility]  Alcohol Use Disorder Identification Test Final Score (AUDIT) 25 2    CAGE-AID   Flowsheet Row Counselor from 07/03/2019 in [place] Score 0    GAD-7   Flowsheet Row Clinical Support from 04/02/2020 in [facility] Counselor from 03/12/2020 in [place] from 01/10/2020 in [place] from 12/20/2019 in [facility]  Total GAD-7 Score 21 13 20 15     PHQ2-9   Flowsheet Row Clinical Support from 04/02/2020 in [facility] Counselor from 03/12/2020 in [place] from 01/10/2020 in [place] from 12/20/2019 in [place]  PHQ-2 Total Score 4 5 3 5   PHQ-9 Total Score 21 18 19 22     [place] from 04/02/2020 in [facility] Counselor from 03/12/2020 in [facility] ED from 03/11/2019 in [place] Error: Q7 should not be populated when Q6 is No Low Risk High Risk       Assessment and Plan: Patient endorses symptoms of anxiety and depression anxiety. Today he is agreeable to increasing hydroxyzine 10 mg 3 times daily as needed to 25 mg 3 times a day to help manage anxiety. He will start BuSpar 10 mg 3 times daily.   1. Paranoid schizophrenia ([place])  Continue- traZODone (DESYREL) 100 MG tablet; Take 1 tablet (100 mg total) by mouth at bedtime as needed for sleep.  Dispense: 30 tablet; Refill: 2 Continue- ARIPiprazole ER (ABILIFY MAINTENA) 400 MG PRSY prefilled syringe; Inject 400 mg into the muscle every 28 (twenty-eight) days.  Dispense: 1 each; Refill: 11  2. Severe episode of recurrent major depressive  disorder, with psychotic features ([place])  Continue- traZODone (DESYREL) 100 MG tablet; Take 1 tablet (100 mg total) by mouth at bedtime as needed for sleep.  Dispense: 30 tablet; Refill: 2 Continue-  (Celexa) 60 MG capsule; Take 1 capsule (40 mg total) by mouth daily.  Dispense: 30 capsule; Refill: 2 Start- busPIRone (BUSPAR) 10 MG tablet; Take 1 tablet (10 mg total) by mouth daily.  Dispense: 90 tablet; Refill: 2  3. Generalized anxiety disorder  Continue- hydrOXYzine (ATARAX/VISTARIL) 25  MG tablet; Take 1 tablet (25 mg total) by mouth 3 (three) times daily as needed.  Dispense: 90 tablet; Refill: 2 Continue-  (Celexa) 60 MG capsule; Take 1 capsule (40 mg total) by mouth daily.  Dispense: 30 capsule; Refill: 2 Continue- busPIRone (BUSPAR) 10 MG tablet; Take 1 tablet (10 mg total) by mouth daily.  Dispense: 90 tablet; Refill: 2   Follow-up in 3 weeks. Follow up with therapy      Salley Slaughter, NP 04/07/2020, 8:57 AM

## 2020-04-03 ENCOUNTER — Ambulatory Visit (HOSPITAL_COMMUNITY): Payer: Medicaid Other | Admitting: Licensed Clinical Social Worker

## 2020-04-07 ENCOUNTER — Other Ambulatory Visit (HOSPITAL_COMMUNITY): Payer: Self-pay | Admitting: Psychiatry

## 2020-04-07 ENCOUNTER — Ambulatory Visit (INDEPENDENT_AMBULATORY_CARE_PROVIDER_SITE_OTHER): Payer: Medicaid Other | Admitting: Licensed Clinical Social Worker

## 2020-04-07 ENCOUNTER — Other Ambulatory Visit: Payer: Self-pay

## 2020-04-07 DIAGNOSIS — F200 Paranoid schizophrenia: Secondary | ICD-10-CM

## 2020-04-07 DIAGNOSIS — F333 Major depressive disorder, recurrent, severe with psychotic symptoms: Secondary | ICD-10-CM

## 2020-04-07 DIAGNOSIS — F411 Generalized anxiety disorder: Secondary | ICD-10-CM

## 2020-04-07 MED ORDER — BUSPIRONE HCL 15 MG PO TABS: 15.0000 mg | ORAL_TABLET | Freq: Every day | ORAL | 2 refills | Status: DC

## 2020-04-07 MED ORDER — CITALOPRAM HYDROBROMIDE 40 MG PO TABS: 40.0000 mg | ORAL_TABLET | Freq: Every day | ORAL | 2 refills | Status: DC

## 2020-04-07 NOTE — Progress Notes (Signed)
   THERAPIST PROGRESS NOTE  Session Time: 3  Participation Level: Active  Behavioral Response: Casual and Fairly GroomedAlertAnxious  Type of Therapy: Individual Therapy  Treatment Goals addressed: Diagnosis: anxiety   Interventions: CBT and Supportive  Summary: Isaiah Black is a 45 y.o. male who presents with paranoid schizophrenia .   Suicidal/Homicidal: NAwithout intent/plan  Therapist Response:    Subjective/Objective:  Pt was alert and oriented x 5. He was dressed casually and engaged well in therapy session. Isaiah Black presented with anxious mood/affect. He was cooperative and maintained good eye contact.   Primary stressor today is medications. He is confused on which ones he should be taking. Isaiah Black states he has had an increase in anxiety over the past 10 days. Example provided not being able to stand to go to the bathroom, dizziness, and right arm weakness/numbness. LCSW explained to pt that if new medications were started then the RN needed to be contacted due to the physical symptoms that were present. LCSW messaged RN and she was available to speak today.    Assessment/Plan: Pt endorse symptoms for anxiety paranoia, AH, and panic attacks. He does meet criteria for paranoid schizophrenia. Isaiah Black is taking medication as prescribed. Plan for LCSW will be to speak with RN about medication side effects, he will also continue with his hygiene of 3 showers per week currently taking two and changing of his sicks 4 times per week. Pt is doing daily chores daily for dish washing, feeding animals, and taking out the trash.  Plan: Return again in 4 weeks.     Dory Horn, LCSW 04/07/2020

## 2020-04-09 ENCOUNTER — Encounter (HOSPITAL_COMMUNITY): Payer: Medicaid Other | Admitting: Psychiatry

## 2020-05-02 ENCOUNTER — Other Ambulatory Visit: Payer: Self-pay

## 2020-05-02 ENCOUNTER — Encounter (HOSPITAL_COMMUNITY): Payer: Self-pay

## 2020-05-02 ENCOUNTER — Ambulatory Visit (HOSPITAL_COMMUNITY): Payer: Medicaid Other | Admitting: *Deleted

## 2020-05-02 VITALS — BP 122/74 | HR 92 | Ht 66.0 in | Wt 133.0 lb

## 2020-05-02 DIAGNOSIS — F333 Major depressive disorder, recurrent, severe with psychotic symptoms: Secondary | ICD-10-CM

## 2020-05-02 NOTE — Progress Notes (Signed)
In for scheduled Abilify M 400 mg injection. Given today in his R deltoid without difficulty. Mom accompanied him to his appt and from here do as they always do, run errands. He states he is doing well, he continues to have periodic anxiety attacks, but not worse or better than usual. He and his mom have started new craft projects, making things with a [employer] machine and his sister sell them online. He is still looking for independent housing, but nothing so far. To return in one month for his next injection.

## 2020-05-08 ENCOUNTER — Ambulatory Visit (INDEPENDENT_AMBULATORY_CARE_PROVIDER_SITE_OTHER): Payer: Medicaid Other | Admitting: Licensed Clinical Social Worker

## 2020-05-08 ENCOUNTER — Other Ambulatory Visit: Payer: Self-pay

## 2020-05-08 DIAGNOSIS — F411 Generalized anxiety disorder: Secondary | ICD-10-CM

## 2020-05-08 DIAGNOSIS — F200 Paranoid schizophrenia: Secondary | ICD-10-CM
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
## 2020-05-19 ENCOUNTER — Other Ambulatory Visit (HOSPITAL_COMMUNITY): Payer: Self-pay | Admitting: Psychiatry

## 2020-05-19 DIAGNOSIS — F200 Paranoid schizophrenia: Secondary | ICD-10-CM

## 2020-05-26 ENCOUNTER — Emergency Department (HOSPITAL_COMMUNITY)
Admission: EM | Admit: 2020-05-26 | Discharge: 2020-05-27 | Disposition: A | Discharge status: Home / Self Care | Payer: Medicaid Other | Attending: Emergency Medicine | Admitting: Emergency Medicine

## 2020-05-26 ENCOUNTER — Emergency Department (HOSPITAL_COMMUNITY): Payer: Medicaid Other

## 2020-05-26 DIAGNOSIS — R079 Chest pain, unspecified: Secondary | ICD-10-CM | POA: Diagnosis present

## 2020-05-26 DIAGNOSIS — R0789 Other chest pain: Secondary | ICD-10-CM

## 2020-05-26 DIAGNOSIS — R42 Dizziness and giddiness: Secondary | ICD-10-CM | POA: Diagnosis not present

## 2020-05-26 DIAGNOSIS — Z20822 Contact with and (suspected) exposure to covid-19: Secondary | ICD-10-CM | POA: Insufficient documentation

## 2020-05-26 DIAGNOSIS — J45901 Unspecified asthma with (acute) exacerbation: Secondary | ICD-10-CM | POA: Insufficient documentation

## 2020-05-26 DIAGNOSIS — Z87891 Personal history of nicotine dependence: Secondary | ICD-10-CM | POA: Insufficient documentation

## 2020-05-26 LAB — CBC WITH DIFFERENTIAL/PLATELET
Abs Immature Granulocytes: 0.04 10*3/uL (ref 0.00–0.07)
Basophils Absolute: 0.1 10*3/uL (ref 0.0–0.1)
Basophils Relative: 1 %
Eosinophils Absolute: 0.5 10*3/uL (ref 0.0–0.5)
Eosinophils Relative: 6 %
HCT: 40.3 % (ref 39.0–52.0)
Hemoglobin: 13.4 g/dL (ref 13.0–17.0)
Immature Granulocytes: 0 %
Lymphocytes Relative: 8 %
Lymphs Abs: 0.7 10*3/uL (ref 0.7–4.0)
MCH: 30.6 pg (ref 26.0–34.0)
MCHC: 33.3 g/dL (ref 30.0–36.0)
MCV: 92 fL (ref 80.0–100.0)
Monocytes Absolute: 0.7 10*3/uL (ref 0.1–1.0)
Monocytes Relative: 8 %
Neutro Abs: 7 10*3/uL (ref 1.7–7.7)
Neutrophils Relative %: 77 %
Platelets: 225 10*3/uL (ref 150–400)
RBC: 4.38 MIL/uL (ref 4.22–5.81)
RDW: 12 % (ref 11.5–15.5)
WBC: 9.1 10*3/uL (ref 4.0–10.5)
nRBC: 0 % (ref 0.0–0.2)

## 2020-05-26 MED ORDER — ALBUTEROL SULFATE (2.5 MG/3ML) 0.083% IN NEBU
5.0000 mg | INHALATION_SOLUTION | Freq: Once | RESPIRATORY_TRACT | Status: AC
  Administered 2020-05-26: 5 mg via RESPIRATORY_TRACT
  Filled 2020-05-26: qty 6

## 2020-05-26 MED ORDER — IPRATROPIUM BROMIDE 0.02 % IN SOLN
0.5000 mg | Freq: Once | RESPIRATORY_TRACT | Status: AC
  Administered 2020-05-26: 0.5 mg via RESPIRATORY_TRACT
  Filled 2020-05-26: qty 2.5

## 2020-05-26 MED ORDER — PREDNISONE 20 MG PO TABS
60.0000 mg | ORAL_TABLET | Freq: Once | ORAL | Status: AC
  Administered 2020-05-26: 60 mg via ORAL
  Filled 2020-05-26: qty 3

## 2020-05-26 NOTE — ED Provider Notes (Signed)
[facility] EMERGENCY DEPARTMENT Provider Note   CSN: [id number] Arrival date & time: 05/26/20  2205     History Chief Complaint  Patient presents with  . Chest Pain    Nichael Ehly is a 45 y.o. male.  Patient with history of asthma, schizophrenia presents the emergency department today for evaluation of chest pain shortness of breath.  Patient states he has had a couple of episodes similar to tonight starting 3 days ago.  Patient states that 3 days ago he had an episode with associated wheezing and he went outside and symptoms calm down.  Tonight he was at rest when he developed sharp pain in the mid chest with associated wheezing and shortness of breath.  He felt lightheaded.  He states that he checked a home EKG monitor and notes that he was in A. Fib and heart rate was in the 120s.  EMS noted frequent PVCs.  Symptoms are associated with wheezing.  He does not currently have any medicine for asthma.  No fevers or chills.  Coughing with wheezing, otherwise no significant cough.  Patient denies risk factors for pulmonary embolism including: unilateral leg swelling, history of DVT/PE/other blood clots, use of exogenous hormones, recent immobilizations, recent surgery, recent travel (>4hr segment), malignancy, hemoptysis.   No history of heart disease.  Patient with admission 11/2019 for respiratory arrest thought secondary to IM invega injection. Cardiac work-up without any structural abnormalities.         Past Medical History:  Diagnosis Date  . Anxiety   . Disassociation disorder   . Paranoid schizophrenia [facility])     Patient Active Problem List   Diagnosis Date Noted  . Paranoid schizophrenia ([place]) 12/20/2019  . 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 Generalized anxiety disorder 12/20/2019  . Respiratory arrest ([place])   . Cardiac arrest ([place]) 11/28/2019  . Schizophrenia ([place]) 07/06/2017    No  past surgical history on file.     No family history on file.  Social History   Tobacco Use  . Smoking status: Former Research scientist (life sciences)  . Smokeless tobacco: Never Used  Vaping Use  . Vaping Use: Former  Substance Use Topics  . Alcohol use: Not Currently    Comment: occ  . Drug use: Not Currently    Home Medications Prior to Admission medications   Medication Sig Start Date End Date Taking? Authorizing Provider  albuterol (VENTOLIN HFA) 108 (90 Base) MCG/ACT inhaler Inhale 2 puffs into the lungs every 6 (six) hours as needed for wheezing or shortness of breath.    [provider]  ARIPiprazole ER (ABILIFY MAINTENA) 400 MG PRSY prefilled syringe Inject 400 mg into the muscle every 28 (twenty-eight) days. 04/02/20   Salley Slaughter, NP  benztropine (COGENTIN) 1 MG tablet TAKE 1 TABLET(1 MG) BY MOUTH TWICE DAILY 05/19/20   Eulis Canner E, NP  busPIRone (BUSPAR) 15 MG tablet Take 1 tablet (15 mg total) by mouth daily. 04/07/20   Salley Slaughter, NP  citalopram (CELEXA) 40 MG tablet Take 1 tablet (40 mg total) by mouth daily. 04/07/20   Salley Slaughter, NP  hydrOXYzine (ATARAX/VISTARIL) 25 MG tablet Take 1 tablet (25 mg total) by mouth 3 (three) times daily as needed. 04/02/20   Salley Slaughter, NP  traZODone (DESYREL) 100 MG tablet Take 1 tablet (100 mg total) by mouth at bedtime as needed for sleep. 04/02/20   Salley Slaughter, NP    Allergies  Patient has no known allergies.  Review of Systems   Review of Systems  Constitutional: Negative for diaphoresis and fever.  Eyes: Negative for redness.  Respiratory: Positive for cough, chest tightness and shortness of breath.   Cardiovascular: Positive for palpitations. Negative for leg swelling.  Gastrointestinal: Negative for abdominal pain, nausea and vomiting.  Genitourinary: Negative for dysuria.  Musculoskeletal: Negative for back pain and neck pain.  Skin: Negative for rash.  Neurological: Positive for  light-headedness. Negative for syncope.  Psychiatric/Behavioral: The patient is not nervous/anxious.     Physical Exam Updated Vital Signs SpO2 97%   Physical Exam Vitals and nursing note reviewed.  Constitutional:      Appearance: He is well-developed.  HENT:     Head: Normocephalic and atraumatic.  Eyes:     General:        Right eye: No discharge.        Left eye: No discharge.     Conjunctiva/sclera: Conjunctivae normal.  Cardiovascular:     Rate and Rhythm: Regular rhythm. Tachycardia present.  Extrasystoles (Frequent) are present.    Heart sounds: Normal heart sounds.  Pulmonary:     Effort: Pulmonary effort is normal.     Breath sounds: Wheezing (Moderate expiratory wheezing, all fields) present.     Comments: Occasional coughing during exam.  Patient is able to speak in full sentences and does not appear to be in distress. Abdominal:     Palpations: Abdomen is soft.     Tenderness: There is no abdominal tenderness.  Musculoskeletal:     Cervical back: Normal range of motion and neck supple.     Right lower leg: No tenderness. No edema.     Left lower leg: No tenderness. No edema.  Skin:    General: Skin is warm and dry.  Neurological:     Mental Status: He is alert.     ED Results / Procedures / Treatments   Labs (all labs ordered are listed, but only abnormal results are displayed) Labs Reviewed  RESP PANEL BY RT-PCR (FLU A&B, COVID) ARPGX2  CBC WITH DIFFERENTIAL/PLATELET  BASIC METABOLIC PANEL  TROPONIN I (HIGH SENSITIVITY)    EKG None  Radiology No results found.  Procedures Procedures   Medications Ordered in ED Medications  predniSONE (DELTASONE) tablet 60 mg (has no administration in time range)  albuterol (PROVENTIL) (2.5 MG/3ML) 0.083% nebulizer solution 5 mg (has no administration in time range)  ipratropium (ATROVENT) nebulizer solution 0.5 mg (has no administration in time range)    ED Course  I have reviewed the triage vital signs  and the nursing notes.  Pertinent labs & imaging results that were available during my care of the patient were reviewed by me and considered in my medical decision making (see chart for details).   Patient seen and examined.  Patient with active wheezing, cough, chest tightness consistent with an asthma exacerbation.  He is mildly tachycardic with frequent PVCs on the monitor.  Will send chest pain labs and check a chest x-ray.  Will give albuterol, Atrovent, prednisone and reassess.  At this point of low concern for PE, ACS.  Patient looks comfortable.  12:07 AM patient rechecked and wheezing now resolved.  He states that shortness of breath is improved.  He still has some pain in the left lateral chest that is made worse with yawning.  This is suspicious for musculoskeletal pain.  Pending labs.  Will order albuterol HFA for patient to go home with.  He  does not currently want another nebulizer treatment.  Signout to [employer] at shift change.  Plan check labs, reassess patient.  If he is feeling better, plan for discharged home with albuterol inhaler, prednisone.   Clinical Course as of 05/27/20 0006  Mon May 26, 2020  2347 Intermittent CP with wheezing, SOB Treating for asthma, received 1 breathing tx.  Pending troponin High suspicion for asthma flare Discharge with prednisone taper, no antibiotics. [CG]    Clinical Course User Index [CG] Kinnie Feil, PA-C   MDM Rules/Calculators/A&P                          CP: Atypical, doubt ACS/PE.  Likely MSK pain.  Cardiac work-up pending.  Asthma attack: Improved with nebulizer treatment, home with albuterol and steroids.    Final Clinical Impression(s) / ED Diagnoses Final diagnoses:  Exacerbation of asthma, unspecified asthma severity, unspecified whether persistent  Musculoskeletal chest pain    Rx / DC Orders ED Discharge Orders    None       Carlisle Cater, Hershal Coria 05/27/20 0009    Luna Fuse, MD 06/04/20  1427

## 2020-05-26 NOTE — ED Triage Notes (Signed)
Pt arrived to ED via EMS from home w/ c/o CP with sudden onset of 2115 L-sided and sharp. Pt denies any cardiac hx. Pt noted his HR was 120's at time of CP onset. Pt reports an episode of lightheadedness and near syncope last Friday and pt reported feeling the same tonight w/ CP. EMS noted pt to be in [place]. EMS placed 18g L AC and gave 529mL NS. BP 118/60, HR 110, 97% RA RR 20

## 2020-05-27 LAB — RESP PANEL BY RT-PCR (FLU A&B, COVID) ARPGX2
Influenza A by PCR: NEGATIVE
Influenza B by PCR: NEGATIVE
SARS Coronavirus 2 by RT PCR: NEGATIVE

## 2020-05-27 LAB — BASIC METABOLIC PANEL
Anion gap: 8 (ref 5–15)
BUN: 15 mg/dL (ref 6–20)
CO2: 23 mmol/L (ref 22–32)
Calcium: 8 mg/dL — ABNORMAL LOW (ref 8.9–10.3)
Chloride: 106 mmol/L (ref 98–111)
Creatinine, Ser: 1.04 mg/dL (ref 0.61–1.24)
GFR, Estimated: >60 mL/min (ref >60)
Glucose, Bld: 123 mg/dL — ABNORMAL HIGH (ref 70–99)
Potassium: 4 mmol/L (ref 3.5–5.1)
Sodium: 137 mmol/L (ref 135–145)

## 2020-05-27 LAB — TROPONIN I (HIGH SENSITIVITY)
Troponin I (High Sensitivity): 3 ng/L (ref <18)
Troponin I (High Sensitivity): 3 ng/L (ref <18)

## 2020-05-27 MED ORDER — ALBUTEROL SULFATE HFA 108 (90 BASE) MCG/ACT IN AERS: 2.0000 | INHALATION_SPRAY | RESPIRATORY_TRACT | Status: DC | PRN

## 2020-05-27 MED ORDER — PREDNISONE 10 MG (21) PO TBPK: ORAL_TABLET | Freq: Every day | ORAL | 0 refills | Status: DC

## 2020-05-27 NOTE — Discharge Instructions (Signed)
You were seen in the emergency department for cough, wheezing, chest pain  Lab work, chest x-ray was normal.  Respiratory panel was negative  Your symptoms are most likely from a flare of asthma  Take prednisone taper which will help with your wheezing and inflammation in your lungs.  Use your albuterol inhaler.  You may take over-the-counter cough medicine like Delsym.  Return to the ED for worsening symptoms, fever, chest pain or shortness of breath with exertion, bloody cough

## 2020-06-02 ENCOUNTER — Other Ambulatory Visit: Payer: Self-pay

## 2020-06-02 ENCOUNTER — Encounter (HOSPITAL_COMMUNITY): Payer: Self-pay

## 2020-06-02 ENCOUNTER — Telehealth (INDEPENDENT_AMBULATORY_CARE_PROVIDER_SITE_OTHER): Payer: Medicaid Other | Admitting: Psychiatry

## 2020-06-02 ENCOUNTER — Other Ambulatory Visit (HOSPITAL_COMMUNITY): Payer: Self-pay | Admitting: Psychiatry

## 2020-06-02 ENCOUNTER — Ambulatory Visit (HOSPITAL_COMMUNITY): Payer: Medicaid Other | Admitting: *Deleted

## 2020-06-02 ENCOUNTER — Encounter (HOSPITAL_COMMUNITY): Payer: Self-pay | Admitting: Psychiatry

## 2020-06-02 VITALS — BP 118/80 | HR 97 | Ht 66.0 in | Wt 131.0 lb

## 2020-06-02 DIAGNOSIS — F411 Generalized anxiety disorder: Secondary | ICD-10-CM

## 2020-06-02 DIAGNOSIS — F200 Paranoid schizophrenia: Secondary | ICD-10-CM

## 2020-06-02 MED ORDER — HYDROXYZINE HCL 50 MG PO TABS: 50.0000 mg | ORAL_TABLET | Freq: Three times a day (TID) | ORAL | 2 refills | Status: DC | PRN

## 2020-06-02 NOTE — Telephone Encounter (Signed)
Called Duilio and spoke with him re the increase in his hydroxyzine. He explained it to his mom which I could hear on my end of the phone. He expressed the directions to increase to a 50 mg tab TID prn correctly.

## 2020-06-02 NOTE — Progress Notes (Signed)
In as scheduled accompanied by mom for his monthly injection. He reports he went to the ED thinking he was having a heart attack because he was having chest pain and it went down his L arm. ED Dr thought his concerns were related to anxiety and panic. He does have an appt with PCP the end of June. He is feeling fine now, other than L arm pain and because of it he requested injection be in his R deltoid. Abilify M 400 mg given without difficulty. He has a new hobby, bought a new type of printer and is making coasters, t shirts and the like, enjoying himself. He reported his probation officer encouraged him to tell Dr Ronne Binning his anxiety was bad and see if meds could be adjusted. Will bring this concern to Dr Ronne Binning attention. To return in one month for his next injection.

## 2020-06-02 NOTE — Telephone Encounter (Signed)
Thank you :)

## 2020-06-02 NOTE — Telephone Encounter (Signed)
On 05/26/2020 patient was seen in the ED for chest pains and shortness of breath. Patient was prescribed albuterol and was sent home with an albuterol inhaler and prednisone to treat an asthma attack. Patient does have a follow-up appointment with a primary care doctor to help manage his asthma. Provider was informed that patient called and noted that he has been increasingly anxious. Provider increased hydroxyzine 25 mg 3 times a day to 50 mg 3 times a day as needed to help manage anxiety. He will follow-up with provider on 06/25/2020 for further evaluation. No other concerns noted at this time

## 2020-06-03 ENCOUNTER — Ambulatory Visit (HOSPITAL_COMMUNITY): Payer: Medicaid Other | Admitting: Licensed Clinical Social Worker

## 2020-06-03 DIAGNOSIS — F200 Paranoid schizophrenia: Secondary | ICD-10-CM

## 2020-06-03 NOTE — Progress Notes (Signed)
   THERAPIST PROGRESS NOTE  Session Time: 53  Participation Level: Active  Behavioral Response: CasualAlertAnxious and Depressed  Type of Therapy: Individual Therapy  Treatment Goals addressed: Diagnosis: Paranoid schizophrenia   Interventions: CBT and Supportive  Summary: Aravind Chrismer is a 45 y.o. male who presents with paranoid schizophrenia .   Suicidal/Homicidal: NAwithout intent/plan  Therapist Response:    Subjective/Objective:  Pt was alert and oriented x 5. He was dressed casually and engaged well in therapy session. Pt presented with anxious mood/affect.   Johnattan presented today with his mother. He states his anxiety has been up. Pt was in the ED for irregular heartbeat, but pt reports that this was Dx as an asthma attack. Mom states pt anxiety has been up with more tasks to complete. Some tasks that were listed was housing, chores, and medication mgmt. Mom also reports that Rabon niece who accused Mandel of his sexual offender offense has now moved out of [place].   Plan: Pt to complete his weekly medication by placing them in pill container designated with days of the week 7/7 days. Pt to complete 3 chores daily. Aquan will complete Programmer, applications for Housing. Pt will continue to attempt to complete intake for peer-to-peer support.    Assessment: Malcolm Quast symptoms for anxiety as tension, worry, and restlessness. He states that he continues to have trouble falling asleep as evidence by tossing and turning to get comfortable. He still has AH although they have decrease with monthly injections and oral medications. Josiah will f/u in 4 weeks.  Plan: Return again in 4 weeks.    Dory Horn, LCSW 06/03/2020

## 2020-06-11 ENCOUNTER — Telehealth (HOSPITAL_COMMUNITY): Payer: Self-pay | Admitting: *Deleted

## 2020-06-11 ENCOUNTER — Other Ambulatory Visit (HOSPITAL_COMMUNITY): Payer: Self-pay | Admitting: *Deleted

## 2020-06-11 DIAGNOSIS — F411 Generalized anxiety disorder: Secondary | ICD-10-CM

## 2020-06-11 DIAGNOSIS — F333 Major depressive disorder, recurrent, severe with psychotic symptoms: Secondary | ICD-10-CM

## 2020-06-11 MED ORDER — BUSPIRONE HCL 15 MG PO TABS: 30.0000 mg | ORAL_TABLET | Freq: Two times a day (BID) | ORAL | 2 refills | Status: DC

## 2020-06-11 NOTE — Telephone Encounter (Signed)
Call from patient and mom is also speaking with me in the background. Two weeks ago Dr Isaiah Black increased his Hydroxyzine to 50 mg TID for his complaint of ongoing anxiety. Today he says he thinks he is getting worse. He reports ongoing anxiety, at times worse that has no trigger, lasts several hours and his whole body is shaking and he cant control it or calm it, his feet get sweaty, Monday he had an episode and he couldn't feel his L foot on the ground, his body feels tingly and it was dramatic enough Monday his mom ALMOST called EMS. He reports his sleep is good, but daily anxiety. Will forward concern to Dr Isaiah Black for any possible changes.

## 2020-06-11 NOTE — Telephone Encounter (Signed)
Called mom to give instructions on change in his Buspar dose to 30 mg twice a day. He currently has 15 mg tabs so discussed taking two of the 15 mg pills twice a day and he can take a 15 mg pill now so he will have the increase today. He will take the second 30 mg tonight around dinner. Mom expressed her understanding. ALso talked about the importance of self talk and calming talk once he starts feeling anxious. She is familiar with this as she has asthma and would do that for herself and her daughter when an attack would start.

## 2020-06-11 NOTE — Telephone Encounter (Signed)
Provider gave nursing staff verbal order to increase Busbar 15 mg twice daily to 30 mg twice daily. Nursing staff made patient aware of changes and order was signed by provider. No other concerns noted at this time.

## 2020-06-11 NOTE — Telephone Encounter (Signed)
Thank you for following up with the patient.

## 2020-06-23 ENCOUNTER — Other Ambulatory Visit (HOSPITAL_COMMUNITY): Payer: Self-pay | Admitting: Psychiatry

## 2020-06-23 DIAGNOSIS — F333 Major depressive disorder, recurrent, severe with psychotic symptoms: Secondary | ICD-10-CM

## 2020-06-23 DIAGNOSIS — F411 Generalized anxiety disorder: Secondary | ICD-10-CM

## 2020-06-25 ENCOUNTER — Ambulatory Visit (INDEPENDENT_AMBULATORY_CARE_PROVIDER_SITE_OTHER): Payer: Medicaid Other | Admitting: Psychiatry

## 2020-06-25 ENCOUNTER — Other Ambulatory Visit: Payer: Self-pay

## 2020-06-25 ENCOUNTER — Encounter (HOSPITAL_COMMUNITY): Payer: Self-pay | Admitting: Psychiatry

## 2020-06-25 VITALS — BP 134/85 | HR 115 | Ht 66.0 in | Wt 128.0 lb

## 2020-06-25 DIAGNOSIS — G2401 Drug induced subacute dyskinesia: Secondary | ICD-10-CM | POA: Diagnosis not present

## 2020-06-25 DIAGNOSIS — F333 Major depressive disorder, recurrent, severe with psychotic symptoms: Secondary | ICD-10-CM

## 2020-06-25 DIAGNOSIS — F200 Paranoid schizophrenia: Secondary | ICD-10-CM

## 2020-06-25 DIAGNOSIS — F411 Generalized anxiety disorder: Secondary | ICD-10-CM | POA: Diagnosis not present

## 2020-06-25 MED ORDER — BENZTROPINE MESYLATE 1 MG PO TABS: ORAL_TABLET | ORAL | 2 refills | Status: DC

## 2020-06-25 MED ORDER — CITALOPRAM HYDROBROMIDE 40 MG PO TABS: ORAL_TABLET | ORAL | 2 refills | Status: DC

## 2020-06-25 MED ORDER — ABILIFY MAINTENA 400 MG IM PRSY: 400.0000 mg | PREFILLED_SYRINGE | INTRAMUSCULAR | 11 refills | Status: DC

## 2020-06-25 MED ORDER — BUSPIRONE HCL 15 MG PO TABS: 30.0000 mg | ORAL_TABLET | Freq: Two times a day (BID) | ORAL | 2 refills | Status: DC

## 2020-06-25 MED ORDER — VALBENAZINE TOSYLATE 40 MG PO CAPS: 40.0000 mg | ORAL_CAPSULE | Freq: Every day | ORAL | 2 refills | Status: DC

## 2020-06-25 MED ORDER — HYDROXYZINE HCL 50 MG PO TABS
50.0000 mg | ORAL_TABLET | Freq: Three times a day (TID) | ORAL | 2 refills | Status: DC | PRN
Start: 2020-06-25 — End: 2020-07-29

## 2020-06-25 MED ORDER — MIRTAZAPINE 7.5 MG PO TABS
7.5000 mg | ORAL_TABLET | Freq: Every day | ORAL | 2 refills | Status: DC
Start: 2020-06-25 — End: 2020-07-29

## 2020-06-25 NOTE — Progress Notes (Signed)
BH MD/PA/NP OP Progress Note  06/25/2020 2:28 PM Isaiah Black  MRN:  [id number]  Chief Complaint: "I have been shaking more" Chief Complaint    Medication Management      HPI: 56 old male seen today for follow-up psychiatric evaluation.  He has a psychiatric history of paranoid schizophrenia.  He is currently being managed on Cogentin 1 mg twice daily, Buspar 30 mg twice daily, Celexa 40 mg daily, Abilify 400 mg monthly injection, and trazodone100 mg nightly . Patient reported that his medications are somewhat effective in managing his psychiatric condition.  Today he is fairly groomed, pleasant, cooperative, engaged in conversation, maintained eye contact.  He informed provider that he has been having increased tremors in his hands and legs.  He notes sometimes he has facial twitches.  Provider conducted an aims assessment and patient scored a 2.  Patient notes that his anxiety and depression has somewhat improved since his last visit.  Today provider conducted a GAD-7 and patient scored 18, at his last visit he scored 21.  Provider also conducted a PHQ-9 and patient scored a 19, at his last visit he scored a 21.  Today he endorses [facility] noting that he hears chatter occasionally and sees shadows.  He denies SI/HI/VAH or paranoia.  Patient endorses hypersomnia.  He notes that he sleeps 6 to 12 hours nightly.  He informed provider that he has not taken trazodone as frequently as he once did.  Patient informed Probation officer that he is happy because he has been working with the [employer] to find housing.  He notes that his rent budget will be a little over $600.  Today patient is agreeable to starting Ingrezza 40 mg to help manage symptoms of TD.  He will discontinue trazodone and start mirtazapine 7.5 mg nightly to help manage sleep, appetite, anxiety, and depression. Potential side effects of medication and risks vs benefits of treatment vs non-treatment were explained and discussed. All questions were  answered. He will continue all other medications as prescribed no other concerns noted at this time.  visit Diagnosis:    ICD-10-CM   1. Tardive dyskinesia  G24.01   2. Paranoid schizophrenia ([place])  F20.0   3. 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 Generalized anxiety disorder  F41.1     Past Psychiatric History: paranoid schizophrenia, anxiety, and disassociation disorder.   Past Medical History:  Past Medical History:  Diagnosis Date  . Anxiety   . Disassociation disorder   . Paranoid schizophrenia ([place])    History reviewed. No pertinent surgical history.  Family Psychiatric History: Paternal uncle schizophrenia, Father Bipolar 1, Maternal aunt Bipolar 1, Niece depression    Family History: History reviewed. No pertinent family history.  Social History:  Social History   Socioeconomic History  . Marital status: Single    Spouse name: Not on file  . Number of children: 1  . Years of education: Not on file  . Highest education level: Not on file  Occupational History  . Not on file  Tobacco Use  . Smoking status: Former Research scientist (life sciences)  . Smokeless tobacco: Never Used  Vaping Use  . Vaping Use: Former  Substance and Sexual Activity  . Alcohol use: Not Currently    Comment: occ  . Drug use: Not Currently  . Sexual activity: Yes  Other Topics Concern  . Not on file  Social History Narrative  . Not on file   Social Determinants  of Health   Financial Resource Strain: High Risk  . Difficulty of Paying Living Expenses: Hard  Food Insecurity: No Food Insecurity  . Worried About Charity fundraiser in the Last Year: Never true  . Ran Out of Food in the Last Year: Never true  Transportation Needs: No Transportation Needs  . Lack of Transportation (Medical): No  . Lack of Transportation (Non-Medical): No  Physical Activity: Insufficiently Active  . Days of Exercise per Week: 2 days  . Minutes of Exercise per  Session: 50 min  Stress: Stress Concern Present  . Feeling of Stress : Very much  Social Connections: Socially Isolated  . Frequency of Communication with Friends and Family: More than three times a week  . Frequency of Social Gatherings with Friends and Family: Once a week  . Attends Religious Services: Never  . Active Member of Clubs or Organizations: No  . Attends Archivist Meetings: Never  . Marital Status: Never married    Allergies: No Known Allergies  Metabolic Disorder Labs: Lab Results  Component Value Date   HGBA1C 4.6 (L) 03/07/2019   MPG 85.32 03/07/2019   MPG 99.67 07/08/2017   Lab Results  Component Value Date   PROLACTIN 100.0 (H) 03/07/2019   PROLACTIN 51.1 (H) 07/08/2017   Lab Results  Component Value Date   CHOL 132 03/07/2019   TRIG 48 03/07/2019   HDL 42 03/07/2019   CHOLHDL 3.1 03/07/2019   VLDL 10 03/07/2019   LDLCALC 80 03/07/2019   LDLCALC 109 (H) 07/08/2017   Lab Results  Component Value Date   TSH 5.024 (H) 03/07/2019   TSH 6.137 (H) 07/08/2017    Therapeutic Level Labs: No results found for: LITHIUM No results found for: VALPROATE No components found for:  CBMZ  Current Medications: Current Outpatient Medications  Medication Sig Dispense Refill  . albuterol (VENTOLIN HFA) 108 (90 Base) MCG/ACT inhaler Inhale 2 puffs into the lungs every 6 (six) hours as needed for wheezing or shortness of breath.    . ARIPiprazole ER (ABILIFY MAINTENA) 400 MG PRSY prefilled syringe Inject 400 mg into the muscle every 28 (twenty-eight) days. 1 each 11  . benztropine (COGENTIN) 1 MG tablet TAKE 1 TABLET(1 MG) BY MOUTH TWICE DAILY 60 tablet 2  . busPIRone (BUSPAR) 15 MG tablet Take 2 tablets (30 mg total) by mouth 2 (two) times daily. 60 tablet 2  . citalopram (CELEXA) 40 MG tablet TAKE 1 TABLET(40 MG) BY MOUTH DAILY 30 tablet 2  . hydrOXYzine (ATARAX/VISTARIL) 50 MG tablet Take 1 tablet (50 mg total) by mouth 3 (three) times daily as needed.  90 tablet 2  . predniSONE (STERAPRED UNI-PAK 21 TAB) 10 MG (21) TBPK tablet Take by mouth daily. Take 6 tabs by mouth daily  for 2 days, then 5 tabs for 2 days, then 4 tabs for 2 days, then 3 tabs for 2 days, 2 tabs for 2 days, then 1 tab by mouth daily for 2 days 42 tablet 0  . traZODone (DESYREL) 100 MG tablet Take 1 tablet (100 mg total) by mouth at bedtime as needed for sleep. 30 tablet 2   Current Facility-Administered Medications  Medication Dose Route Frequency Provider Last Rate Last Admin  . ARIPiprazole ER (ABILIFY MAINTENA) 400 MG prefilled syringe 400 mg  400 mg Intramuscular Q28 days Eulis Canner E, NP   400 mg at 06/02/20 1607     Musculoskeletal: Strength & Muscle Tone: within normal limits Gait & Station: normal Patient  leans: N/A  Psychiatric Specialty Exam: Review of Systems  Blood pressure 134/85, pulse (!) 115, height 5\' 6"  (1.676 m), weight 128 lb (58.1 kg).Body mass index is 20.66 kg/m.  General Appearance: Well Groomed  Eye Contact:  Good  Speech:  Clear and Coherent and Normal Rate  Volume:  Normal  Mood:  Anxious and Depressed  Affect:  Congruent  Thought Process:  Coherent, Goal Directed and Linear  Orientation:  Full (Time, Place, and Person)  Thought Content: Logical and Hallucinations: Auditory Visual   Suicidal Thoughts:  No  Homicidal Thoughts:  No  Memory:  Immediate;   Good Recent;   Good Remote;   Good  Judgement:  Good  Insight:  Good  Psychomotor Activity:  Normal  Concentration:  Concentration: Good and Attention Span: Good  Recall:  Good  Fund of Knowledge: Good  Language: Good  Akathisia:  No  Handed:  Right  AIMS (if indicated): done  Assets:  Communication Skills Desire for Improvement Financial Resources/Insurance Housing Social Support  ADL's:  Intact  Cognition: WNL  Sleep:  Fair   Screenings: AIMS   Flowsheet Row Clinical Support from 10/31/2019 in [facility] Admission (Discharged)  from 07/06/2017 in [place] [facility]  AIMS Total Score 5 0    AUDIT   Flowsheet Row Admission (Discharged) from 03/06/2019 in [place] Admission (Discharged) from 07/06/2017 in [place] [facility]  Alcohol Use Disorder Identification Test Final Score (AUDIT) 25 2    CAGE-AID   Flowsheet Row Counselor from 07/03/2019 in [place] Score 0    GAD-7   [place] from 06/25/2020 in [place] from 04/02/2020 in [facility] Counselor from 03/12/2020 in [place] from 01/10/2020 in [place] from 12/20/2019 in [facility]  Total GAD-7 Score 18 21 13 20 15     PHQ2-9   Flowsheet Row Clinical Support from 06/25/2020 in [place] from 04/02/2020 in [facility] Counselor from 03/12/2020 in [place] from 01/10/2020 in [place] from 12/20/2019 in [place]  PHQ-2 Total Score 2 4 5 3 5   PHQ-9 Total Score 19 21 18 19 22     Flowsheet Row Clinical Support from 06/25/2020 in [place] from 04/02/2020 in [facility] Counselor from 03/12/2020 in [place] Error: Question 6 not populated Error: Q7 should not be populated when Q6 is No Low Risk       Assessment and Plan: Patient endorses symptoms of VAH, anxiety and depression anxiety. oday patient is agreeable to starting Ingrezza 40 mg to help manage symptoms of TD.  He will discontinue trazodone and start mirtazapine  7.5 mg nightly to help manage sleep, appetite, anxiety, and depression.   1. Paranoid schizophrenia ([place])  Continue- ARIPiprazole ER (ABILIFY MAINTENA) 400 MG PRSY prefilled syringe; Inject 400 mg into the muscle every 28 (twenty-eight) days.  Dispense: 1 each; Refill: 11 Continue- benztropine (COGENTIN) 1 MG tablet; TAKE 1 TABLET(1 MG) BY MOUTH TWICE DAILY  Dispense: 60 tablet; Refill: 2  2. Severe episode of recurrent major depressive disorder, with psychotic features ([place])  Continue- busPIRone (  BUSPAR) 15 MG tablet; Take 2 tablets (30 mg total) by mouth 2 (two) times daily.  Dispense: 60 tablet; Refill: 2 Continue- citalopram (CELEXA) 40 MG tablet; TAKE 1 TABLET(40 MG) BY MOUTH DAILY  Dispense: 30 tablet; Refill: 2 Start- mirtazapine (REMERON) 7.5 MG tablet; Take 1 tablet (7.5 mg total) by mouth at bedtime.  Dispense: 30 tablet; Refill: 2  3. Generalized anxiety disorder  Continue- busPIRone (BUSPAR) 15 MG tablet; Take 2 tablets (30 mg total) by mouth 2 (two) times daily.  Dispense: 60 tablet; Refill: 2 Continue- citalopram (CELEXA) 40 MG tablet; TAKE 1 TABLET(40 MG) BY MOUTH DAILY  Dispense: 30 tablet; Refill: 2 Continue- hydrOXYzine (ATARAX/VISTARIL) 50 MG tablet; Take 1 tablet (50 mg total) by mouth 3 (three) times daily as needed.  Dispense: 90 tablet; Refill: 2 Start- mirtazapine (REMERON) 7.5 MG tablet; Take 1 tablet (7.5 mg total) by mouth at bedtime.  Dispense: 30 tablet; Refill: 2  4. Tardive dyskinesia  Continue- valbenazine (INGREZZA) 40 MG capsule; Take 1 capsule (40 mg total) by mouth daily.  Dispense: 30 capsule; Refill: 2    Follow-up in 1 weeks. Follow up with therapy      Salley Slaughter, NP 06/25/2020, 2:27 PM

## 2020-07-01 ENCOUNTER — Ambulatory Visit (HOSPITAL_COMMUNITY): Payer: Medicaid Other | Admitting: *Deleted

## 2020-07-01 ENCOUNTER — Encounter (HOSPITAL_COMMUNITY): Payer: Self-pay

## 2020-07-01 ENCOUNTER — Other Ambulatory Visit: Payer: Self-pay

## 2020-07-01 VITALS — BP 116/81 | HR 56 | Ht 66.0 in | Wt 129.8 lb

## 2020-07-01 DIAGNOSIS — F200 Paranoid schizophrenia: Secondary | ICD-10-CM

## 2020-07-01 NOTE — Progress Notes (Signed)
In accompanied by his mom for his monthly injection of Abilify M 400 mg. Today it was given in his R deltoid without difficulty. He was seen by his provider recently for an increase in his anxiety and med changes were made. He and mom both report he is feeling better with med changes. He is calmer and less hand movement since starting the Ingrezza and the Remeron and taking the Vistaril more routinely. He continues to stay busy with his crafts and family activities. He has no complaints to report today. He is to return in one month for his next injection.

## 2020-07-08 ENCOUNTER — Telehealth (HOSPITAL_COMMUNITY): Payer: Self-pay | Admitting: *Deleted

## 2020-07-08 NOTE — Telephone Encounter (Signed)
[place] TRACKS MOVEMENT DISORDERS PRIOR AUTHORIZATION  APPROVED  valbenazine (INGREZZA) 40 MG capsule   P.A. # [id number]  EFFECTIVE: 06/03/20222    THRU    12/31/2020

## 2020-07-08 NOTE — Telephone Encounter (Signed)
Thank you for this information.  Ingrezza 40 mg was filled on 06/25/2020 with 2 refills.  He should be able to pick these up from his preferred pharmacy.

## 2020-07-09 ENCOUNTER — Ambulatory Visit (INDEPENDENT_AMBULATORY_CARE_PROVIDER_SITE_OTHER): Payer: Medicaid Other | Admitting: Licensed Clinical Social Worker

## 2020-07-09 ENCOUNTER — Other Ambulatory Visit: Payer: Self-pay

## 2020-07-09 DIAGNOSIS — F200 Paranoid schizophrenia: Secondary | ICD-10-CM

## 2020-07-09 DIAGNOSIS — F333 Major depressive disorder, recurrent, severe with psychotic symptoms: Secondary | ICD-10-CM

## 2020-07-09 DIAGNOSIS — F411 Generalized anxiety disorder: Secondary | ICD-10-CM | POA: Diagnosis not present

## 2020-07-09 NOTE — Progress Notes (Signed)
   THERAPIST PROGRESS NOTE  Session Time: 71   Participation Level: Active  Behavioral Response: CasualAlertAnxious and Depressed  Type of Therapy: Individual Therapy  Treatment Goals addressed: Diagnosis: Paranoid schziophrenia   Interventions: CBT and Supportive  Summary: Isaiah Black is a 45 y.o. male who presents with paranoid schziophrenia and MDD.   Suicidal/Homicidal: NAwithout intent/plan Therapist Response:    Subjective/Objective: Pt was alert and oriented x 5. He was dressed casually and engaged well in therapy session. Isaiah Black presented with anxious and restless mood/affect. He was cooperative and maintained good eye contact.    Primary stressor for pt is housing. He now has income from social security. He has not been able to find housing due to the fact he a sex offender. Pt has been looking into resources provided by LCSW. Pt is now connected with Solicitor for housing and furniture resources.   Plan: Pt to continue to f/u with [facility] x per week while attempting to obtain housing. Pt to maintain his three daily chores per day [employer], garbage, feeding dogs). New Objective will be to take his medication daily without skipping days.    Assessment: Pt endorses symptoms for AH, VH, and paranoia. He also endorses symptoms for tension, worry, and restlessness for anxiety. For depression pt reports fatigue and fluctuating diet. He Has Hx for paranoid schizophrenia and MDD. He is taking all medication as directed minus anxiety medication which he states he forgets to take. Plan list above. Isaiah Black to f/u in 4 weeks.   Plan: Return again in 4 weeks.     Dory Horn, LCSW 07/09/2020

## 2020-07-15 ENCOUNTER — Telehealth (HOSPITAL_COMMUNITY): Payer: Self-pay | Admitting: *Deleted

## 2020-07-15 NOTE — Telephone Encounter (Signed)
Message from patient yesterday while I was out of the office that he had received a letter from [facility] that he has been denied on his [place]. I spoke with Jana Half at [facility] and initially he was denied because an AIMS scale was not included with the original request and then resubmitted the next day with an AIMS scaled score and it was approved till 06/29/21. Ref number for this call is [id number].

## 2020-07-24 ENCOUNTER — Other Ambulatory Visit: Payer: Self-pay

## 2020-07-24 ENCOUNTER — Ambulatory Visit (INDEPENDENT_AMBULATORY_CARE_PROVIDER_SITE_OTHER): Payer: Medicaid Other | Admitting: Primary Care

## 2020-07-24 ENCOUNTER — Encounter (INDEPENDENT_AMBULATORY_CARE_PROVIDER_SITE_OTHER): Payer: Self-pay | Admitting: Primary Care

## 2020-07-24 DIAGNOSIS — F200 Paranoid schizophrenia: Secondary | ICD-10-CM

## 2020-07-24 DIAGNOSIS — J4540 Moderate persistent asthma, uncomplicated: Secondary | ICD-10-CM

## 2020-07-24 DIAGNOSIS — Z23 Encounter for immunization: Secondary | ICD-10-CM

## 2020-07-24 DIAGNOSIS — Z7689 Persons encountering health services in other specified circumstances: Secondary | ICD-10-CM

## 2020-07-24 MED ORDER — ALBUTEROL SULFATE HFA 108 (90 BASE) MCG/ACT IN AERS
2.0000 | INHALATION_SPRAY | Freq: Four times a day (QID) | RESPIRATORY_TRACT | 3 refills | Status: DC | PRN
Start: 2020-07-24 — End: 2020-10-24

## 2020-07-24 MED ORDER — BUDESONIDE-FORMOTEROL FUMARATE 160-4.5 MCG/ACT IN AERO: 2.0000 | INHALATION_SPRAY | Freq: Two times a day (BID) | RESPIRATORY_TRACT | 12 refills | Status: DC

## 2020-07-24 MED ORDER — MONTELUKAST SODIUM 10 MG PO TABS: 10.0000 mg | ORAL_TABLET | Freq: Every day | ORAL | 3 refills | Status: DC

## 2020-07-24 NOTE — Progress Notes (Signed)
Would like to get medications for asthma

## 2020-07-24 NOTE — Patient Instructions (Signed)
Top Foods for Calcium and Vitamin D Spinach. Kale. Okra. Collards. Soybeans. White beans. Some fish, like sardines, salmon, perch, and rainbow trout. Foods that are calcium-fortified, such as some orange juice, oatmeal, and breakfast cereal.

## 2020-07-24 NOTE — Progress Notes (Signed)
New Patient Office Visit  Subjective:  Patient ID: Isaiah Black, male    DOB: Apr 26, 1975  Age: 45 y.o. MRN: [id number]  CC: No chief complaint on file.   HPI Isaiah Black is a 45 year old male presents for establishment of care and care for asthma.  Past Medical History:  Diagnosis Date  . Anxiety   . Disassociation disorder   . Paranoid schizophrenia ([place])     No past surgical history on file.  No family history on file.  Social History   Socioeconomic History  . Marital status: Single    Spouse name: Not on file  . Number of children: 1  . Years of education: Not on file  . Highest education level: Not on file  Occupational History  . Not on file  Tobacco Use  . Smoking status: Former    Pack years: 0.00  . Smokeless tobacco: Never  Vaping Use  . Vaping Use: Former  Substance and Sexual Activity  . Alcohol use: Not Currently    Comment: occ  . Drug use: Not Currently  . Sexual activity: Yes  Other Topics Concern  . Not on file  Social History Narrative  . Not on file   Social Determinants of Health   Financial Resource Strain: High Risk  . Difficulty of Paying Living Expenses: Hard  Food Insecurity: No Food Insecurity  . Worried About Charity fundraiser in the Last Year: Never true  . Ran Out of Food in the Last Year: Never true  Transportation Needs: No Transportation Needs  . Lack of Transportation (Medical): No  . Lack of Transportation (Non-Medical): No  Physical Activity: Insufficiently Active  . Days of Exercise per Week: 2 days  . Minutes of Exercise per Session: 50 min  Stress: Stress Concern Present  . Feeling of Stress : Very much  Social Connections: Socially Isolated  . Frequency of Communication with Friends and Family: More than three times a week  . Frequency of Social Gatherings with Friends and Family: Once a week  . Attends Religious Services: Never  . Active Member of Clubs or Organizations: No  . Attends Theatre manager Meetings: Never  . Marital Status: Never married  Intimate Partner Violence: Not At Risk  . Fear of Current or Ex-Partner: No  . Emotionally Abused: No  . Physically Abused: No  . Sexually Abused: No    ROS Review of Systems  Respiratory:  Positive for cough, shortness of breath and wheezing.   Psychiatric/Behavioral:  Positive for sleep disturbance.        Due to waking up wheezing and coughing  All other systems reviewed and are negative.  Objective:   Today's Vitals: BP 105/66 (BP Location: Right Arm, Patient Position: Sitting, Cuff Size: Normal)   Pulse (!) 54   Temp 98.2 F (36.8 C)   Resp 16   Wt 132 lb (59.9 kg)   SpO2 95%   BMI 21.31 kg/m   Physical Exam Vitals reviewed.  Constitutional:      Appearance: Normal appearance. He is normal weight.  HENT:     Head: Normocephalic.     Right Ear: Tympanic membrane and external ear normal.     Left Ear: Tympanic membrane and external ear normal.     Nose: Nose normal.  Eyes:     Extraocular Movements: Extraocular movements intact.     Pupils: Pupils are equal, round, and reactive to light.  Cardiovascular:     Rate  and Rhythm: Normal rate and regular rhythm.  Pulmonary:     Effort: Pulmonary effort is normal.     Breath sounds: Normal breath sounds.  Abdominal:     General: Abdomen is flat. Bowel sounds are normal.     Palpations: Abdomen is soft.  Musculoskeletal:        General: Normal range of motion.     Cervical back: Normal range of motion.  Skin:    General: Skin is warm and dry.  Neurological:     Mental Status: He is alert and oriented to person, place, and time.  Psychiatric:        Mood and Affect: Mood normal.        Behavior: Behavior normal.   Assessment & Plan:   Problem List Items Addressed This Visit   None   Outpatient Encounter Medications as of 07/24/2020  Medication Sig  . ARIPiprazole ER (ABILIFY MAINTENA) 400 MG PRSY prefilled syringe Inject 400 mg into the muscle  every 28 (twenty-eight) days.  . busPIRone (BUSPAR) 15 MG tablet Take 2 tablets (30 mg total) by mouth 2 (two) times daily.  . hydrOXYzine (ATARAX/VISTARIL) 50 MG tablet Take 1 tablet (50 mg total) by mouth 3 (three) times daily as needed.  . mirtazapine (REMERON) 7.5 MG tablet Take 1 tablet (7.5 mg total) by mouth at bedtime.  . valbenazine (INGREZZA) 40 MG capsule Take 1 capsule (40 mg total) by mouth daily.  Marland Kitchen albuterol (VENTOLIN HFA) 108 (90 Base) MCG/ACT inhaler Inhale 2 puffs into the lungs every 6 (six) hours as needed for wheezing or shortness of breath. (Patient not taking: Reported on 07/24/2020)  . benztropine (COGENTIN) 1 MG tablet TAKE 1 TABLET(1 MG) BY MOUTH TWICE DAILY (Patient not taking: Reported on 07/24/2020)  . citalopram (CELEXA) 40 MG tablet TAKE 1 TABLET(40 MG) BY MOUTH DAILY (Patient not taking: Reported on 07/24/2020)  . [DISCONTINUED] predniSONE (STERAPRED UNI-PAK 21 TAB) 10 MG (21) TBPK tablet Take by mouth daily. Take 6 tabs by mouth daily  for 2 days, then 5 tabs for 2 days, then 4 tabs for 2 days, then 3 tabs for 2 days, 2 tabs for 2 days, then 1 tab by mouth daily for 2 days   Facility-Administered Encounter Medications as of 07/24/2020  Medication  . ARIPiprazole ER (ABILIFY MAINTENA) 400 MG prefilled syringe 400 mg    Follow-up: No follow-ups on file.   Kerin Perna, NP

## 2020-07-24 NOTE — Progress Notes (Signed)
New Patient Office Visit  Subjective:  Patient ID: Isaiah Black, male    DOB: 1975/10/24  Age: 45 y.o. MRN: [id number]  CC: Establish Care  HPI Zacary Bauer is a 45 year old male presents for establishment of care and care for asthma.  Past Medical History:  Diagnosis Date   Anxiety    Disassociation disorder    Paranoid schizophrenia ([place])     History reviewed. No pertinent surgical history.  History reviewed. No pertinent family history.  Social History   Socioeconomic History   Marital status: Single    Spouse name: Not on file   Number of children: 1   Years of education: Not on file   Highest education level: Not on file  Occupational History   Not on file  Tobacco Use   Smoking status: Former    Pack years: 0.00   Smokeless tobacco: Never  Vaping Use   Vaping Use: Former  Substance and Sexual Activity   Alcohol use: Not Currently    Comment: occ   Drug use: Not Currently   Sexual activity: Yes  Other Topics Concern   Not on file  Social History Narrative   Not on file   Social Determinants of Health   Financial Resource Strain: High Risk   Difficulty of Paying Living Expenses: Hard  Food Insecurity: No Food Insecurity   Worried About Charity fundraiser in the Last Year: Never true   Ran Out of Food in the Last Year: Never true  Transportation Needs: No Transportation Needs   Lack of Transportation (Medical): No   Lack of Transportation (Non-Medical): No  Physical Activity: Insufficiently Active   Days of Exercise per Week: 2 days   Minutes of Exercise per Session: 50 min  Stress: Stress Concern Present   Feeling of Stress : Very much  Social Connections: Socially Isolated   Frequency of Communication with Friends and Family: More than three times a week   Frequency of Social Gatherings with Friends and Family: Once a week   Attends Religious Services: Never   Marine scientist or Organizations: No   Attends Arts administrator: Never   Marital Status: Never married  Human resources officer Violence: Not At Risk   Fear of Current or Ex-Partner: No   Emotionally Abused: No   Physically Abused: No   Sexually Abused: No    ROS Review of Systems  Respiratory:  Positive for cough, shortness of breath and wheezing.   Psychiatric/Behavioral:  Positive for sleep disturbance.        Due to waking up wheezing and coughing  All other systems reviewed and are negative.  Objective:   Today's Vitals: BP 105/66 (BP Location: Right Arm, Patient Position: Sitting, Cuff Size: Normal)   Pulse (!) 54   Temp 98.2 F (36.8 C)   Resp 16   Wt 132 lb (59.9 kg)   SpO2 95%   BMI 21.31 kg/m   Physical Exam Vitals reviewed.  Constitutional:      Appearance: Normal appearance. He is normal weight.  HENT:     Head: Normocephalic.     Right Ear: Tympanic membrane and external ear normal.     Left Ear: Tympanic membrane and external ear normal.     Nose: Nose normal.  Eyes:     Extraocular Movements: Extraocular movements intact.     Pupils: Pupils are equal, round, and reactive to light.  Cardiovascular:     Rate and Rhythm: Normal  rate and regular rhythm.  Pulmonary:     Effort: Pulmonary effort is normal.     Breath sounds: Normal breath sounds.  Abdominal:     General: Abdomen is flat. Bowel sounds are normal.     Palpations: Abdomen is soft.  Musculoskeletal:        General: Normal range of motion.     Cervical back: Normal range of motion.  Skin:    General: Skin is warm and dry.  Neurological:     Mental Status: He is alert and oriented to person, place, and time.  Psychiatric:        Mood and Affect: Mood normal.        Behavior: Behavior normal.   Assessment & Plan:  Diagnoses and all orders for this visit:  Low calcium levels Advise to get OTC calcium 1,000 -     Vitamin D, 25-hydroxy  Encounter to establish care Establish care with PCP  Moderate persistent asthma without complication Asthma  is recognize by classic presentation of symptoms which include cough, wheeze, and shortness of breath brought on by characteristic triggers and relieved by bronchodilating medications.  This is caused by from hyper responsiveness with tendency of airways to narrow excessively in response to a stimuli. -     montelukast (SINGULAIR) 10 MG tablet; Take 1 tablet (10 mg total) by mouth at bedtime. -     albuterol (VENTOLIN HFA) 108 (90 Base) MCG/ACT inhaler; Inhale 2 puffs into the lungs every 6 (six) hours as needed for wheezing or shortness of breath. -     budesonide-formoterol (SYMBICORT) 160-4.5 MCG/ACT inhaler; Inhale 2 puffs into the lungs 2 (two) times daily.  Need for diphtheria-tetanus-pertussis (Tdap) vaccine -     Tdap vaccine greater than or equal to 7yo IM  Paranoid schizophrenia ([place]) Managed by Behavioral health    Outpatient Encounter Medications as of 07/24/2020  Medication Sig   ARIPiprazole ER (ABILIFY MAINTENA) 400 MG PRSY prefilled syringe Inject 400 mg into the muscle every 28 (twenty-eight) days.   budesonide-formoterol (SYMBICORT) 160-4.5 MCG/ACT inhaler Inhale 2 puffs into the lungs 2 (two) times daily.   busPIRone (BUSPAR) 15 MG tablet Take 2 tablets (30 mg total) by mouth 2 (two) times daily.   hydrOXYzine (ATARAX/VISTARIL) 50 MG tablet Take 1 tablet (50 mg total) by mouth 3 (three) times daily as needed.   mirtazapine (REMERON) 7.5 MG tablet Take 1 tablet (7.5 mg total) by mouth at bedtime.   montelukast (SINGULAIR) 10 MG tablet Take 1 tablet (10 mg total) by mouth at bedtime.   valbenazine (INGREZZA) 40 MG capsule Take 1 capsule (40 mg total) by mouth daily.   albuterol (VENTOLIN HFA) 108 (90 Base) MCG/ACT inhaler Inhale 2 puffs into the lungs every 6 (six) hours as needed for wheezing or shortness of breath.   benztropine (COGENTIN) 1 MG tablet TAKE 1 TABLET(1 MG) BY MOUTH TWICE DAILY (Patient not taking: Reported on 07/24/2020)   citalopram (CELEXA) 40 MG tablet TAKE 1  TABLET(40 MG) BY MOUTH DAILY (Patient not taking: Reported on 07/24/2020)   [DISCONTINUED] albuterol (VENTOLIN HFA) 108 (90 Base) MCG/ACT inhaler Inhale 2 puffs into the lungs every 6 (six) hours as needed for wheezing or shortness of breath. (Patient not taking: Reported on 07/24/2020)   [DISCONTINUED] predniSONE (STERAPRED UNI-PAK 21 TAB) 10 MG (21) TBPK tablet Take by mouth daily. Take 6 tabs by mouth daily  for 2 days, then 5 tabs for 2 days, then 4 tabs for 2 days, then  3 tabs for 2 days, 2 tabs for 2 days, then 1 tab by mouth daily for 2 days   Facility-Administered Encounter Medications as of 07/24/2020  Medication   ARIPiprazole ER (ABILIFY MAINTENA) 400 MG prefilled syringe 400 mg    Follow-up: Return in about 3 months (around 10/24/2020) for asthma.   Kerin Perna, NP

## 2020-07-25 LAB — VITAMIN D 25 HYDROXY (VIT D DEFICIENCY, FRACTURES): Vit D, 25-Hydroxy: 27 ng/mL — ABNORMAL LOW (ref 30.0–100.0)

## 2020-07-28 ENCOUNTER — Encounter (HOSPITAL_COMMUNITY): Payer: Medicaid Other | Admitting: Psychiatry

## 2020-07-29 ENCOUNTER — Ambulatory Visit (INDEPENDENT_AMBULATORY_CARE_PROVIDER_SITE_OTHER): Payer: Medicaid Other | Admitting: *Deleted

## 2020-07-29 ENCOUNTER — Encounter (HOSPITAL_COMMUNITY): Payer: Self-pay | Admitting: Psychiatry

## 2020-07-29 ENCOUNTER — Other Ambulatory Visit: Payer: Self-pay

## 2020-07-29 ENCOUNTER — Ambulatory Visit (INDEPENDENT_AMBULATORY_CARE_PROVIDER_SITE_OTHER): Payer: Medicaid Other | Admitting: Psychiatry

## 2020-07-29 ENCOUNTER — Encounter (HOSPITAL_COMMUNITY): Payer: Self-pay

## 2020-07-29 VITALS — BP 113/80 | HR 90 | Ht 66.0 in | Wt 133.6 lb

## 2020-07-29 DIAGNOSIS — F320 Major depressive disorder, single episode, mild: Secondary | ICD-10-CM | POA: Diagnosis not present

## 2020-07-29 DIAGNOSIS — G2401 Drug induced subacute dyskinesia: Secondary | ICD-10-CM

## 2020-07-29 DIAGNOSIS — F411 Generalized anxiety disorder: Secondary | ICD-10-CM | POA: Diagnosis not present

## 2020-07-29 DIAGNOSIS — F32A Depression, unspecified: Secondary | ICD-10-CM

## 2020-07-29 DIAGNOSIS — F200 Paranoid schizophrenia: Secondary | ICD-10-CM | POA: Diagnosis not present

## 2020-07-29 MED ORDER — HYDROXYZINE HCL 50 MG PO TABS: 50.0000 mg | ORAL_TABLET | Freq: Three times a day (TID) | ORAL | 2 refills | Status: DC | PRN

## 2020-07-29 MED ORDER — ABILIFY MAINTENA 400 MG IM PRSY: 400.0000 mg | PREFILLED_SYRINGE | INTRAMUSCULAR | 10 refills | Status: DC

## 2020-07-29 MED ORDER — MIRTAZAPINE 7.5 MG PO TABS: 7.5000 mg | ORAL_TABLET | Freq: Every day | ORAL | 2 refills | Status: DC

## 2020-07-29 MED ORDER — BUSPIRONE HCL 15 MG PO TABS: 30.0000 mg | ORAL_TABLET | Freq: Two times a day (BID) | ORAL | 2 refills | Status: DC

## 2020-07-29 MED ORDER — VALBENAZINE TOSYLATE 40 MG PO CAPS
40.0000 mg | ORAL_CAPSULE | Freq: Every day | ORAL | 2 refills | Status: DC
Start: 2020-07-29 — End: 2020-12-30

## 2020-07-29 NOTE — Progress Notes (Signed)
BH MD/PA/NP OP Progress Note  07/29/2020 10:41 AM Isaiah Black  MRN:  [id number]  Chief Complaint: "My anxiety is better and I am not shaking as much"    HPI: 38 old male seen today for follow-up psychiatric evaluation.  He has a psychiatric history of paranoid schizophrenia.  He is currently being managed on Ingrezza 40 mg daily, Buspar 30 mg twice daily, Celexa 40 mg daily, Abilify 400 mg monthly injection, and Mirtazapine 7.5 mg nightly . Patient reported that he has not started mirtazapine and notes that he has not taken Celexa since his last visit  Today he is well groomed, pleasant, cooperative, engaged in conversation, maintained eye contact.  He informed provider that his anxiety has improved and he is he no longer shakes.  Provider conducted an aims assessment and patient scored a 0, at his last visit he scored a patient scored a 2.    Today provider conducted a GAD-7 and patient scored 13, at his last visit he scored 18.  Provider also conducted a PHQ-9 and patient scored a 13, at his last visit he scored a 19.  Today he endorses [facility] noting that he hears chatter occasionally and sees shadows.  He denies SI/HI/ however notes he has mild paranoia.  He endorses adequate sleep and appetite.  Patient notes that recently he has shortness of breath.  He informed Probation officer that he received his albuterol inhaler and notes that his asthma is managed well.  Today patient agreeable to restarting mirtazapine 7.5 mg nightly help manage anxiety, depression, sleep, and appetite.  He will continue Ingrezza, BuSpar, and Abilify as prescribed.  At this time he does not want to restart Celexa.  No other concerns noted at this time.  visit Diagnosis:    ICD-10-CM   1. Paranoid schizophrenia ([place])  F20.0 ARIPiprazole ER (ABILIFY MAINTENA) 400 MG PRSY prefilled syringe    2. Severe episode of recurrent major depressive disorder, with psychotic features ([place])  F33.3 busPIRone (BUSPAR) 15 MG tablet     mirtazapine (REMERON) 7.5 MG tablet    3. Generalized anxiety disorder  F41.1 busPIRone (BUSPAR) 15 MG tablet    hydrOXYzine (ATARAX/VISTARIL) 50 MG tablet    mirtazapine (REMERON) 7.5 MG tablet    4. Tardive dyskinesia  G24.01 valbenazine (INGREZZA) 40 MG capsule      Past Psychiatric History: paranoid schizophrenia, anxiety, and disassociation disorder.   Past Medical History:  Past Medical History:  Diagnosis Date   Anxiety    Disassociation disorder    Paranoid schizophrenia ([place])    No past surgical history on file.  Family Psychiatric History: Paternal uncle schizophrenia, Father Bipolar 1, Maternal aunt Bipolar 1, Niece depression     Family History: No family history on file.  Social History:  Social History   Socioeconomic History   Marital status: Single    Spouse name: Not on file   Number of children: 1   Years of education: Not on file   Highest education level: Not on file  Occupational History   Not on file  Tobacco Use   Smoking status: Former    Pack years: 0.00   Smokeless tobacco: Never  Vaping Use   Vaping Use: Former  Substance and Sexual Activity   Alcohol use: Not Currently    Comment: occ   Drug use: Not Currently   Sexual activity: Yes  Other Topics Concern   Not on file  Social History Narrative   Not on file   Social Determinants  of Health   Financial Resource Strain: High Risk   Difficulty of Paying Living Expenses: Hard  Food Insecurity: No Food Insecurity   Worried About Running Out of Food in the Last Year: Never true   Ran Out of Food in the Last Year: Never true  Transportation Needs: No Transportation Needs   Lack of Transportation (Medical): No   Lack of Transportation (Non-Medical): No  Physical Activity: Insufficiently Active   Days of Exercise per Week: 2 days   Minutes of Exercise per Session: 50 min  Stress: Stress Concern Present   Feeling of Stress : Very much  Social Connections: Socially Isolated    Frequency of Communication with Friends and Family: More than three times a week   Frequency of Social Gatherings with Friends and Family: Once a week   Attends Religious Services: Never   Marine scientist or Organizations: No   Attends Archivist Meetings: Never   Marital Status: Never married    Allergies:  Allergies  Allergen Reactions   Cogentin [Benztropine]     Metabolic Disorder Labs: Lab Results  Component Value Date   HGBA1C 4.6 (L) 03/07/2019   MPG 85.32 03/07/2019   MPG 99.67 07/08/2017   Lab Results  Component Value Date   PROLACTIN 100.0 (H) 03/07/2019   PROLACTIN 51.1 (H) 07/08/2017   Lab Results  Component Value Date   CHOL 132 03/07/2019   TRIG 48 03/07/2019   HDL 42 03/07/2019   CHOLHDL 3.1 03/07/2019   VLDL 10 03/07/2019   LDLCALC 80 03/07/2019   LDLCALC 109 (H) 07/08/2017   Lab Results  Component Value Date   TSH 5.024 (H) 03/07/2019   TSH 6.137 (H) 07/08/2017    Therapeutic Level Labs: No results found for: LITHIUM No results found for: VALPROATE No components found for:  CBMZ  Current Medications: Current Outpatient Medications  Medication Sig Dispense Refill   albuterol (VENTOLIN HFA) 108 (90 Base) MCG/ACT inhaler Inhale 2 puffs into the lungs every 6 (six) hours as needed for wheezing or shortness of breath. 18 each 3   ARIPiprazole ER (ABILIFY MAINTENA) 400 MG PRSY prefilled syringe Inject 400 mg into the muscle every 28 (twenty-eight) days. 1 each 10   budesonide-formoterol (SYMBICORT) 160-4.5 MCG/ACT inhaler Inhale 2 puffs into the lungs 2 (two) times daily. 1 each 12   busPIRone (BUSPAR) 15 MG tablet Take 2 tablets (30 mg total) by mouth 2 (two) times daily. 60 tablet 2   hydrOXYzine (ATARAX/VISTARIL) 50 MG tablet Take 1 tablet (50 mg total) by mouth 3 (three) times daily as needed. 90 tablet 2   mirtazapine (REMERON) 7.5 MG tablet Take 1 tablet (7.5 mg total) by mouth at bedtime. 30 tablet 2   montelukast (SINGULAIR)  10 MG tablet Take 1 tablet (10 mg total) by mouth at bedtime. 30 tablet 3   valbenazine (INGREZZA) 40 MG capsule Take 1 capsule (40 mg total) by mouth daily. 30 capsule 2   Current Facility-Administered Medications  Medication Dose Route Frequency Provider Last Rate Last Admin   ARIPiprazole ER (ABILIFY MAINTENA) 400 MG prefilled syringe 400 mg  400 mg Intramuscular Q28 days Eulis Canner E, NP   400 mg at 07/01/20 2683     Musculoskeletal: Strength & Muscle Tone: within normal limits Gait & Station: normal Patient leans: N/A  Psychiatric Specialty Exam: Review of Systems  There were no vitals taken for this visit.There is no height or weight on file to calculate BMI.  General Appearance:  Well Groomed  Eye Contact:  Good  Speech:  Clear and Coherent and Normal Rate  Volume:  Normal  Mood:  Euthymic  Affect:  Congruent  Thought Process:  Coherent, Goal Directed and Linear  Orientation:  Full (Time, Place, and Person)  Thought Content: Logical and Hallucinations: Auditory Visual   Suicidal Thoughts:  No  Homicidal Thoughts:  No  Memory:  Immediate;   Good Recent;   Good Remote;   Good  Judgement:  Good  Insight:  Good  Psychomotor Activity:  Normal  Concentration:  Concentration: Good and Attention Span: Good  Recall:  Good  Fund of Knowledge: Good  Language: Good  Akathisia:  No  Handed:  Right  AIMS (if indicated): done  Assets:  Communication Skills Desire for Improvement Financial Resources/Insurance Housing Social Support  ADL's:  Intact  Cognition: WNL  Sleep:  Good   Screenings: AIMS    Flowsheet Row Clinical Support from 10/31/2019 in [facility] Admission (Discharged) from 07/06/2017 in [facility] [facility]  AIMS Total Score 5 0      AUDIT    Flowsheet Row Admission (Discharged) from 03/06/2019 in [place] Admission (Discharged) from 07/06/2017 in [facility]  Alcohol Use Disorder Identification Test Final Score (AUDIT) 25 2      CAGE-AID    Flowsheet Row Counselor from 07/03/2019 in [place] Score 0      GAD-7    Flowsheet Row Clinical Support from 07/29/2020 in [place] Office Visit from 07/24/2020 in [place] from 06/25/2020 in [place] from 04/02/2020 in [facility] Counselor from 03/12/2020 in [facility]  Total GAD-7 Score 13 13 18 21 13       PHQ2-9    Flowsheet Row Clinical Support from 07/29/2020 in [facility] Office Visit from 07/24/2020 in [place] from 06/25/2020 in [place] from 04/02/2020 in [facility] Counselor from 03/12/2020 in [place]  PHQ-2 Total Score 3 3 2 4 5   PHQ-9 Total Score 13 16 19 21 18       Flowsheet Row Clinical Support from 07/29/2020 in [place] from 06/25/2020 in [place] from 04/02/2020 in [place] Error: Q7 should not be populated when Q6 is No Error: Question 6 not populated Error: Q7 should not be populated when Q6 is No        Assessment and Plan: Patient notes that he is doing well on he discontinued Celexa and did not start mirtazapine.  He notes he has mild anxiety and depression.  Today patient agreeable to restarting mirtazapine 7.5 mg nightly help manage anxiety, depression, sleep, and appetite.  He will continue Ingrezza, BuSpar, and Abilify as prescribed.  At this time he does not want to restart Celexa  1. Paranoid schizophrenia  ([facility])  Continue- ARIPiprazole ER (ABILIFY MAINTENA) 400 MG PRSY prefilled syringe; Inject 400 mg into the muscle every 28 (twenty-eight) days.  Dispense: 1 each; Refill: 10  2. Generalized anxiety disorder  Continue- busPIRone (BUSPAR) 15 MG tablet; Take 2 tablets (30 mg total) by mouth 2 (two) times daily.  Dispense: 60 tablet;  Refill: 2 Continue- hydrOXYzine (ATARAX/VISTARIL) 50 MG tablet; Take 1 tablet (50 mg total) by mouth 3 (three) times daily as needed.  Dispense: 90 tablet; Refill: 2 Restart- mirtazapine (REMERON) 7.5 MG tablet; Take 1 tablet (7.5 mg total) by mouth at bedtime.  Dispense: 30 tablet; Refill: 2  3. Tardive dyskinesia  Continue- valbenazine (INGREZZA) 40 MG capsule; Take 1 capsule (40 mg total) by mouth daily.  Dispense: 30 capsule; Refill: 2  4. Mild depression ([facility]) Continue- busPIRone (BUSPAR) 15 MG tablet; Take 2 tablets (30 mg total) by mouth 2 (two) times daily.  Dispense: 60 tablet; Refill: 2 Restart- mirtazapine (REMERON) 7.5 MG tablet; Take 1 tablet (7.5 mg total) by mouth at bedtime.  Dispense: 30 tablet; Refill: 2    Follow-up in 3 months. Follow up with therapy      Salley Slaughter, NP 07/29/2020, 10:41 AM

## 2020-07-29 NOTE — Progress Notes (Signed)
Patient arrived for injection ARIPiprazole ER (ABILIFY MAINTENA) 400 MG. Patient pleasant as always & he's doing well has many hobbies that is keeping him busy & motivated. Patient tolerated injection well in Left-arm. NO HI/SI NOR AH/VH.

## 2020-08-06 ENCOUNTER — Other Ambulatory Visit: Payer: Self-pay

## 2020-08-06 ENCOUNTER — Ambulatory Visit (INDEPENDENT_AMBULATORY_CARE_PROVIDER_SITE_OTHER): Payer: Medicaid Other | Admitting: Licensed Clinical Social Worker

## 2020-08-06 ENCOUNTER — Encounter (HOSPITAL_COMMUNITY): Payer: Self-pay | Admitting: Licensed Clinical Social Worker

## 2020-08-06 DIAGNOSIS — F411 Generalized anxiety disorder: Secondary | ICD-10-CM | POA: Diagnosis not present

## 2020-08-06 DIAGNOSIS — F200 Paranoid schizophrenia: Secondary | ICD-10-CM

## 2020-08-06 DIAGNOSIS — F333 Major depressive disorder, recurrent, severe with psychotic symptoms: Secondary | ICD-10-CM

## 2020-08-06 NOTE — Progress Notes (Signed)
   THERAPIST PROGRESS NOTE  Session Time: 4   Participation Level: Active  Behavioral Response: Casual and Fairly GroomedAlertAnxious and Depressed  Type of Therapy: Individual Therapy  Treatment Goals addressed: Depression and anxiety    Interventions: CBT and Supportive  Summary: Isaiah Black is a 45 y.o. male who presents with: anxious and restless mood/affect. He was cooperative and maintained good eye contact with LCSW. Pt was cooperative and engaged well.   Primary stressor for pt is medications and housing. He reports that he is still working with [facility] to obtain housing. Updates are provided to [facility], but no housing has been obtained as of today. Pt reports that he is doing well with taking his medications however sometimes has a lapse on a dosage or two.  Suicidal/Homicidal: NAwithout intent/plan  Therapist Response:    Intervention/Plan: LCSW offered solution focused and supportive therapy in today's session. Plan for pt is to f/u with [facility] and make intake appointment. Resources were re provided for pt. He was agreeable to plan. Encouragement and positive reinforcement  was utilized in today session.    Plan: Return again in 4 weeks.      Dory Horn, LCSW 08/06/2020

## 2020-08-26 ENCOUNTER — Ambulatory Visit (HOSPITAL_COMMUNITY): Payer: Medicaid Other

## 2020-08-26 ENCOUNTER — Other Ambulatory Visit: Payer: Self-pay

## 2020-08-26 ENCOUNTER — Encounter (HOSPITAL_COMMUNITY): Payer: Self-pay

## 2020-08-26 ENCOUNTER — Ambulatory Visit (HOSPITAL_COMMUNITY): Payer: Medicaid Other | Admitting: *Deleted

## 2020-08-26 VITALS — BP 118/77 | HR 92 | Ht 66.0 in | Wt 136.0 lb

## 2020-08-26 DIAGNOSIS — F200 Paranoid schizophrenia: Secondary | ICD-10-CM

## 2020-08-26 NOTE — Progress Notes (Signed)
In later today than scheduled because his medication which he brings from his pharmacy was not available till now. He is here with his mom as is their routine. He is doing well, the raw area on his forehead he often picks at when highly anxious is healed up. He said he has been trying to leave it alone. 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 He offers no complaints. Staff praised his work and encouraged him with it and pleased he has found a passion. Today he got his Abilify M 400 mg in his R deltoid without difficulty. He is to return in one month for his next injection.

## 2020-08-27 ENCOUNTER — Ambulatory Visit (HOSPITAL_COMMUNITY): Payer: Medicaid Other

## 2020-09-03 ENCOUNTER — Encounter (HOSPITAL_COMMUNITY): Payer: Self-pay | Admitting: Licensed Clinical Social Worker

## 2020-09-03 ENCOUNTER — Ambulatory Visit (HOSPITAL_COMMUNITY): Payer: Medicaid Other | Admitting: Licensed Clinical Social Worker

## 2020-09-03 ENCOUNTER — Other Ambulatory Visit: Payer: Self-pay

## 2020-09-03 DIAGNOSIS — F411 Generalized anxiety disorder: Secondary | ICD-10-CM

## 2020-09-03 DIAGNOSIS — F331 Major depressive disorder, recurrent, moderate: Secondary | ICD-10-CM | POA: Insufficient documentation

## 2020-09-03 DIAGNOSIS — F200 Paranoid schizophrenia: Secondary | ICD-10-CM

## 2020-09-03 NOTE — Progress Notes (Signed)
   THERAPIST PROGRESS NOTE  Session Time: 30   Participation Level: Active  Behavioral Response: CasualAlertAnxious  Type of Therapy: Individual Therapy  Treatment Goals addressed: Anxiety  Interventions: CBT and Supportive  Summary: Jeffey Weidert is a 45 y.o. male who presents with restless mood\affect.  Patient was alert and oriented x5.  He was pleasant, cooperative, and maintained good eye contact.  Patient engaged well in session today.  Primary stressor for patient is housing.  He states that that his probation officer has agreed a boardinghouse for patient.  Laith is to follow-up with his probation officer on Monday to obtain more information.  Patient's daily hygiene has declined patient was showering 2 times weekly and states now he is showering 1 times weekly goal for patient is to increase to 2-3 times per week.  Patient continues to handle daily chores such as doing dishes taking out the garbage and feeding the dogs.   Ethel has taken up new hobbies such as [place].  This has become a daily habit for patient.  [facility]jective still remains to contact [place] for support groups and other Academy classes that they offer as patient has insurance.  Patient states that he has emailed [employer] and attempted to call them with no response.  Patient in today's session filled out a form online himself to submit to [place] to contact him LCSW did not assist patient with that form.   Suicidal/Homicidal: NAwithout intent/plan  Therapist Response:    Intervention/Plan: Supportive therapy and solution focused therapy was utilized in today's session.  Pressures were given for further contact information for [employer].  Supportive therapy was utilized for encouragement and praise.  Open ended questions for cognitive behavioral therapy were utilized in today's session.  Plan for patient is to continue with daily chores for feeding dogs, taking out trash,  doing dishes.  Patient's objective is to try to increase daily hygiene which is showering at least 2-3 times a week.  Patient's goal is to decrease PHQ-9 and GAD-7 below 10.  Scores for PHQ-9 went from an 8 4 weeks ago to a 13 today.  GAD-7 scores went down from a 9 4 weeks ago to an 8 today.  Plan: Return again in 4 weeks.     Dory Horn, LCSW 09/03/2020

## 2020-09-23 ENCOUNTER — Ambulatory Visit (HOSPITAL_COMMUNITY): Payer: Medicaid Other | Admitting: *Deleted

## 2020-09-23 ENCOUNTER — Encounter (HOSPITAL_COMMUNITY): Payer: Self-pay

## 2020-09-23 ENCOUNTER — Other Ambulatory Visit: Payer: Self-pay

## 2020-09-23 VITALS — BP 106/73 | HR 102 | Ht 66.0 in | Wt 135.0 lb

## 2020-09-23 DIAGNOSIS — F200 Paranoid schizophrenia: Secondary | ICD-10-CM

## 2020-09-23 NOTE — Progress Notes (Signed)
Patient ID: Isaiah Black, male   DOB: [birth year], 45 y.o.   MRN: [id number] In for his monthly injection for his Abilify M 400 mg injection. Today he got it in his R DELTOID without difficulty. His mom is with him. They are both staying busy with craft projects. He denies any issues and offers no complaints, states he is doing well. He is to return in one month for his next injection and also will need to see his new provider.

## 2020-10-07 ENCOUNTER — Ambulatory Visit (HOSPITAL_COMMUNITY): Payer: Medicaid Other | Admitting: Licensed Clinical Social Worker

## 2020-10-07 ENCOUNTER — Other Ambulatory Visit: Payer: Self-pay

## 2020-10-07 DIAGNOSIS — F411 Generalized anxiety disorder: Secondary | ICD-10-CM

## 2020-10-07 DIAGNOSIS — F333 Major depressive disorder, recurrent, severe with psychotic symptoms: Secondary | ICD-10-CM

## 2020-10-07 DIAGNOSIS — F200 Paranoid schizophrenia: Secondary | ICD-10-CM

## 2020-10-07 NOTE — Progress Notes (Signed)
   THERAPIST PROGRESS NOTE  Session Time: 74   Participation Level: Active  Behavioral Response: CasualAlertAnxious and Depressed  Type of Therapy: Individual Therapy  Treatment Goals addressed: Coping  Interventions: CBT and Supportive  Summary: Isaiah Black is a 45 y.o. male who presents with depressed and anxious mood\affect.  Isaiah Black was pleasant, cooperative, and maintained good eye contact.  He was alert and oriented x5.  Patient presents today with his mother.  He reports that he has increased anxiety and depression due to his daughter's mother being on a new relationship.  He states "[name] will have a new male role model in her life".  LCSW offered supportive therapy as primary intervention for this stressor.  Utilizing encouragement, praise, and advice giving.  Patient reports other stressors as housing.  He reports that he is still on the waiting list for housing.  Some barriers for housing has been his "sex offender status" certain houses that are available are too close to a school daycare or childcare facility.  Other barriers are financials as patient is currently on a fixed income $600 per month.  Patient does report some positives stating that he got a new 3D printer that he has been utilizing on a daily basis.  He reports a decrease in insomnia reporting 6+ hours of sleep per night.  Isaiah Black states that he has been doing his chores on a daily basis for dishwashing, giving dogs food, and taking out the trash.  He also reports that he is maintaining his medications on a daily basis on his own without mother's assistance.    Suicidal/Homicidal: Nowithout intent/plan  Therapist Response:    Intervention/Plan: Interventions utilized in today's session were cognitive behavioral therapy, supportive therapy, and person centered therapy.  Interventions used were unconditional positive regard, teaching on cognitive distortions, and cognitive restructuring.  Plan for patient is to  continue with daily chores at least 3/day.  Patient to follow-up with [facility] for virtual classes as in person classes are not obtainable at this time due to sex offender status.  Find independent housing probation officer approved.  Patient to write 1 postcard to his daughter monthly.  Plan: Return again in 4 weeks.      Dory Horn, LCSW 10/07/2020

## 2020-10-21 ENCOUNTER — Ambulatory Visit (INDEPENDENT_AMBULATORY_CARE_PROVIDER_SITE_OTHER): Payer: Medicaid Other | Admitting: Family

## 2020-10-21 ENCOUNTER — Encounter (HOSPITAL_COMMUNITY): Payer: Self-pay | Admitting: Family

## 2020-10-21 ENCOUNTER — Encounter (HOSPITAL_COMMUNITY): Payer: Self-pay

## 2020-10-21 ENCOUNTER — Other Ambulatory Visit: Payer: Self-pay

## 2020-10-21 ENCOUNTER — Ambulatory Visit (HOSPITAL_COMMUNITY): Payer: Medicaid Other | Admitting: *Deleted

## 2020-10-21 VITALS — BP 113/80 | HR 94 | Ht 66.0 in | Wt 138.0 lb

## 2020-10-21 DIAGNOSIS — G2401 Drug induced subacute dyskinesia: Secondary | ICD-10-CM | POA: Diagnosis not present

## 2020-10-21 DIAGNOSIS — F200 Paranoid schizophrenia: Secondary | ICD-10-CM

## 2020-10-21 NOTE — Progress Notes (Signed)
In with his mom for his monthly injection for his Abilify M 400 mg. He got his shot today in his R DELTOID without difficulty. HE showed staff his latest craft projects. He denies any sx of psychosis and offers no complaints. He is to return in one month for his next shot. He also saw the provider today. He is at his baseline.

## 2020-10-21 NOTE — Progress Notes (Signed)
BH MD/PA/NP OP Progress Note  10/21/2020 10:28 AM Isaiah Black  MRN:  [id number]  Chief Complaint:  Chief Complaint   Injections   Paranoid schizophrenia HPI:   Isaiah Black is a 45 year old male seen today for follow-up psychiatric evaluation and clinic administration of long-acting injectable Abilify Maintena. Psychiatric history includes major depressive disorder with psychotic features and paranoid schizophrenia.  He has been managed historically on buspirone, citalopram,  Abilify Maintena, hydroxyzine, Remeron. He endorses compliance with medications. He reports feeling that his mood is managed effectively. Additionally he is followed by outpatient therapy, feels this is therapeutic as well.  Next therapy appointment scheduled for 11/01/2020.  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 He reports he has spent the last two years seeking independent housing. He currently resides with  his mother who is enrolled in Section 8 subsidized housing. He is not, technically, allowed to reside with his mother according to her lease agreement,  Discussed housing assistance options including [facility] [place].   Patient is assessed face-to-face by nurse practitioner, chart reviewed.  Today patient is seated, no acute distress. He is accompanied by his mother, Isaiah Black, he would prefer that mother remain present for visit.  He is appropriately groomed. He is alert and oriented, pleasant and cooperative, he is engaged during assessment. He has clear and coherent speech, average volume.  Behavior calm and appropriate, with good eye contact.    He reports euthymic mood with congruent affect.    Today provider conducted a GAD-7, patient scored a 4, at last visit he scored a 10. Provider also conducted a PHQ-9, he scored a 8, at last visit he scored a 18. He denies suicidal and homicidal ideations currently.  He contracts verbally for safety with this  Probation officer.    He denies both auditory and visual hallucinations.  There is no indication that he is responding to internal stimuli, no evidence of delusional thought content.  Patient is able to converse coherently with goal-directed thoughts and no distractibility or preoccupation. He denies paranoia.  Objectively there is no evidence of psychosis/mania or delusional thinking. Patient is insightful regarding treatment and diagnosis.   Patient is tolerating medications with no adverse effects/reactions per his report.  Patient reports average sleep, average 6 hours each night No nightmares reported recently.   He denies physical pain currently.  He denies both alcohol and substance use.   Patient provided support and encouragement.  Patient to receive monthly [facility] Abilify Maintena 400mg  monthly. Plan to follow-up in one month for Abilify Maintena monthly injection.   Visit Diagnosis:    ICD-10-CM   1. Paranoid schizophrenia ([place])  F20.0     2. Tardive dyskinesia  G24.01       Past Psychiatric History: paranoid schizophrenia, generalized anxiety disorder, major depressive disorder with psychotic features  Past Medical History:  Past Medical History:  Diagnosis Date   Anxiety    Disassociation disorder    Paranoid schizophrenia ([place])    History reviewed. No pertinent surgical history.  Family Psychiatric History: none reported  Family History: History reviewed. No pertinent family history.  Social History:  Social History   Socioeconomic History   Marital status: Single    Spouse name: Not on file   Number of children: 1   Years of education: Not on file   Highest education level: Not on file  Occupational History   Not on file  Tobacco Use   Smoking status: Former   Smokeless tobacco:  Never  Vaping Use   Vaping Use: Former  Substance and Sexual Activity   Alcohol use: Not Currently    Comment: occ   Drug use: Not Currently   Sexual activity: Not Currently  Other Topics  Concern   Not on file  Social History Narrative   Not on file   Social Determinants of Health   Financial Resource Strain: Low Risk    Difficulty of Paying Living Expenses: Not hard at all  Food Insecurity: No Food Insecurity   Worried About Charity fundraiser in the Last Year: Never true   [name] in the Last Year: Never true  Transportation Needs: No Transportation Needs   Lack of Transportation (Medical): No   Lack of Transportation (Non-Medical): No  Physical Activity: Sufficiently Active   Days of Exercise per Week: 7 days   Minutes of Exercise per Session: 60 min  Stress: No Stress Concern Present   Feeling of Stress : Only a little  Social Connections: Socially Isolated   Frequency of Communication with Friends and Family: More than three times a week   Frequency of Social Gatherings with Friends and Family: Once a week   Attends Religious Services: Never   Marine scientist or Organizations: No   Attends Archivist Meetings: Never   Marital Status: Never married    Allergies:  Allergies  Allergen Reactions   Cogentin [Benztropine]     Metabolic Disorder Labs: Lab Results  Component Value Date   HGBA1C 4.6 (L) 03/07/2019   MPG 85.32 03/07/2019   MPG 99.67 07/08/2017   Lab Results  Component Value Date   PROLACTIN 100.0 (H) 03/07/2019   PROLACTIN 51.1 (H) 07/08/2017   Lab Results  Component Value Date   CHOL 132 03/07/2019   TRIG 48 03/07/2019   HDL 42 03/07/2019   CHOLHDL 3.1 03/07/2019   VLDL 10 03/07/2019   LDLCALC 80 03/07/2019   LDLCALC 109 (H) 07/08/2017   Lab Results  Component Value Date   TSH 5.024 (H) 03/07/2019   TSH 6.137 (H) 07/08/2017    Therapeutic Level Labs: No results found for: LITHIUM No results found for: VALPROATE No components found for:  CBMZ  Current Medications: Current Outpatient Medications  Medication Sig Dispense Refill   albuterol (VENTOLIN HFA) 108 (90 Base) MCG/ACT inhaler Inhale 2  puffs into the lungs every 6 (six) hours as needed for wheezing or shortness of breath. 18 each 3   ARIPiprazole ER (ABILIFY MAINTENA) 400 MG PRSY prefilled syringe Inject 400 mg into the muscle every 28 (twenty-eight) days. 1 each 10   budesonide-formoterol (SYMBICORT) 160-4.5 MCG/ACT inhaler Inhale 2 puffs into the lungs 2 (two) times daily. 1 each 12   busPIRone (BUSPAR) 15 MG tablet Take 2 tablets (30 mg total) by mouth 2 (two) times daily. 60 tablet 2   hydrOXYzine (ATARAX/VISTARIL) 50 MG tablet Take 1 tablet (50 mg total) by mouth 3 (three) times daily as needed. 90 tablet 2   mirtazapine (REMERON) 7.5 MG tablet Take 1 tablet (7.5 mg total) by mouth at bedtime. 30 tablet 2   montelukast (SINGULAIR) 10 MG tablet Take 1 tablet (10 mg total) by mouth at bedtime. 30 tablet 3   valbenazine (INGREZZA) 40 MG capsule Take 1 capsule (40 mg total) by mouth daily. 30 capsule 2   Current Facility-Administered Medications  Medication Dose Route Frequency Provider Last Rate Last Admin   ARIPiprazole ER (ABILIFY MAINTENA) 400 MG prefilled syringe 400 mg  400 mg Intramuscular Q28 days Eulis Canner E, NP   400 mg at 10/21/20 1007     Musculoskeletal: Strength & Muscle Tone: within normal limits Gait & Station: normal Patient leans: N/A  Psychiatric Specialty Exam: Review of Systems  Constitutional: Negative.   HENT: Negative.    Eyes: Negative.   Respiratory: Negative.    Cardiovascular: Negative.   Gastrointestinal: Negative.   Genitourinary: Negative.   Musculoskeletal: Negative.   Skin: Negative.   Allergic/Immunologic: Negative.   Neurological: Negative.   Hematological: Negative.   Psychiatric/Behavioral: Negative.   ]  There were no vitals taken for this visit.There is no height or weight on file to calculate BMI.  General Appearance: Casual and Fairly Groomed  Eye Contact:  Good  Speech:  Clear and Coherent and Normal Rate  Volume:  Normal  Mood:  Euthymic  Affect:   Appropriate and Congruent  Thought Process:  Coherent, Goal Directed, and Linear  Orientation:  Full (Time, Place, and Person)  Thought Content: WDL and Logical   Suicidal Thoughts:  No  Homicidal Thoughts:  No  Memory:  Immediate;   Good Recent;   Good Remote;   Good  Judgement:  Good  Insight:  Good  Psychomotor Activity:  Increased  Concentration:  Concentration: Good and Attention Span: Good  Recall:  Good  Fund of Knowledge: Good  Language: Good  Akathisia:  No  Handed:  Right  AIMS (if indicated): not done  Assets:  Communication Skills Desire for Improvement Financial Resources/Insurance Housing Intimacy Leisure Time Physical Health Resilience Social Support  ADL's:  Intact  Cognition: WNL  Sleep:  Fair   Screenings: AIMS    Flowsheet Row Clinical Support from 10/31/2019 in [facility] Admission (Discharged) from 07/06/2017 in [facility] Total Score 5 0      AUDIT    Flowsheet Row Admission (Discharged) from 03/06/2019 in [facility] Admission (Discharged) from 07/06/2017 in [facility]  Alcohol Use Disorder Identification Test Final Score (AUDIT) 25 2      CAGE-AID    Flowsheet Row Counselor from 07/03/2019 in [facility] from 10/21/2020 in [facility] Counselor from 10/07/2020 in [facility] Counselor from 09/03/2020 in [facility] Counselor from 08/06/2020 in [facility] from 07/29/2020 in [facility]  Total GAD-7 Score 4 10 10 11 13       PHQ2-9    [place] from 10/21/2020 in [facility] Counselor from 10/07/2020 [facility] Counselor from 09/03/2020 in [facility] Counselor from 08/06/2020 in [place] from 07/29/2020 in [facility]  PHQ-2 Total Score 2 3 1 2 3   PHQ-9 Total Score 8 18 13 8 13       [place] from 10/21/2020 in [facility] from 07/29/2020 in [place] from 06/25/2020 in  No Risk Error: Q7 should not be populated when Q6 is No Error: Question 6 not populated        Assessment and Plan: Cevin currently stable on medications including : -  Albuterol -Aripiprazole -budesonide-formoterol -buspirone -hydroxyzine -mirtazepine -montelukast -valbenazine  Plan to follow-up in one month for medication administration.    Lucky Rathke, FNP 10/21/2020, 10:28 AM

## 2020-10-21 NOTE — Progress Notes (Signed)
ARIPiprazole ER (ABILIFY MAINTENA) 400 MG

## 2020-10-24 ENCOUNTER — Other Ambulatory Visit: Payer: Self-pay

## 2020-10-24 ENCOUNTER — Ambulatory Visit (INDEPENDENT_AMBULATORY_CARE_PROVIDER_SITE_OTHER): Payer: Medicaid Other | Admitting: Primary Care

## 2020-10-24 VITALS — BP 124/78 | HR 76 | Temp 97.5°F | Wt 140.6 lb

## 2020-10-24 DIAGNOSIS — Z760 Encounter for issue of repeat prescription: Secondary | ICD-10-CM | POA: Diagnosis not present

## 2020-10-24 DIAGNOSIS — J4540 Moderate persistent asthma, uncomplicated: Secondary | ICD-10-CM

## 2020-10-24 MED ORDER — BUDESONIDE-FORMOTEROL FUMARATE 160-4.5 MCG/ACT IN AERO: 2.0000 | INHALATION_SPRAY | Freq: Two times a day (BID) | RESPIRATORY_TRACT | 4 refills | Status: DC

## 2020-10-24 MED ORDER — MONTELUKAST SODIUM 10 MG PO TABS: 10.0000 mg | ORAL_TABLET | Freq: Every day | ORAL | 3 refills | Status: DC

## 2020-10-24 MED ORDER — ALBUTEROL SULFATE HFA 108 (90 BASE) MCG/ACT IN AERS: 2.0000 | INHALATION_SPRAY | Freq: Four times a day (QID) | RESPIRATORY_TRACT | 3 refills | Status: DC | PRN

## 2020-10-24 NOTE — Progress Notes (Signed)
Established Patient Office Visit  Subjective:  Patient ID: Isaiah Black, male    DOB: [birth year]  Age: 45 y.o. MRN: [id number]  CC:  Chief Complaint  Patient presents with   Follow-up    HPI Isaiah Black he is unable to keep still very fidgety . He presents for asthma he is going well on current regiment requesting 3 moths refills. States left temporal area throbs, pressure in the back of his head and fingers lock up.Denies shortness of breath, headaches, chest pain or lower extremity edema. Normally his mother is with him he explained she was sick.  Past Medical History:  Diagnosis Date   Anxiety    Disassociation disorder    Paranoid schizophrenia ([place])     No past surgical history on file.  No family history on file.  Social History   Socioeconomic History   Marital status: Single    Spouse name: Not on file   Number of children: 1   Years of education: Not on file   Highest education level: Not on file  Occupational History   Not on file  Tobacco Use   Smoking status: Former   Smokeless tobacco: Never  Vaping Use   Vaping Use: Former  Substance and Sexual Activity   Alcohol use: Not Currently    Comment: occ   Drug use: Not Currently   Sexual activity: Not Currently  Other Topics Concern   Not on file  Social History Narrative   Not on file   Social Determinants of Health   Financial Resource Strain: Low Risk    Difficulty of Paying Living Expenses: Not hard at all  Food Insecurity: No Food Insecurity   Worried About Charity fundraiser in the Last Year: Never true   Arboriculturist in the Last Year: Never true  Transportation Needs: No Transportation Needs   Lack of Transportation (Medical): No   Lack of Transportation (Non-Medical): No  Physical Activity: Sufficiently Active   Days of Exercise per Week: 7 days   Minutes of Exercise per Session: 60 min  Stress: No Stress Concern Present   Feeling of Stress : Only a little  Social Connections:  Socially Isolated   Frequency of Communication with Friends and Family: More than three times a week   Frequency of Social Gatherings with Friends and Family: Once a week   Attends Religious Services: Never   Marine scientist or Organizations: No   Attends Music therapist: Never   Marital Status: Never married  Human resources officer Violence: Not At Risk   Fear of Current or Ex-Partner: No   Emotionally Abused: No   Physically Abused: No   Sexually Abused: No    Outpatient Medications Prior to Visit  Medication Sig Dispense Refill   ARIPiprazole ER (ABILIFY MAINTENA) 400 MG PRSY prefilled syringe Inject 400 mg into the muscle every 28 (twenty-eight) days. 1 each 10   busPIRone (BUSPAR) 15 MG tablet Take 2 tablets (30 mg total) by mouth 2 (two) times daily. 60 tablet 2   hydrOXYzine (ATARAX/VISTARIL) 50 MG tablet Take 1 tablet (50 mg total) by mouth 3 (three) times daily as needed. 90 tablet 2   mirtazapine (REMERON) 7.5 MG tablet Take 1 tablet (7.5 mg total) by mouth at bedtime. 30 tablet 2   valbenazine (INGREZZA) 40 MG capsule Take 1 capsule (40 mg total) by mouth daily. 30 capsule 2   albuterol (VENTOLIN HFA) 108 (90 Base) MCG/ACT inhaler Inhale 2  puffs into the lungs every 6 (six) hours as needed for wheezing or shortness of breath. 18 each 3   budesonide-formoterol (SYMBICORT) 160-4.5 MCG/ACT inhaler Inhale 2 puffs into the lungs 2 (two) times daily. 1 each 12   montelukast (SINGULAIR) 10 MG tablet Take 1 tablet (10 mg total) by mouth at bedtime. 30 tablet 3   Facility-Administered Medications Prior to Visit  Medication Dose Route Frequency Provider Last Rate Last Admin   ARIPiprazole ER (ABILIFY MAINTENA) 400 MG prefilled syringe 400 mg  400 mg Intramuscular Q28 days Eulis Canner E, NP   400 mg at 10/21/20 1007    Allergies  Allergen Reactions   Cogentin [Benztropine]     ROS Review of Systems  HENT:  Positive for postnasal drip.   Respiratory:   Positive for cough and wheezing.   All other systems reviewed and are negative.    Objective:    Physical Exam Vitals reviewed.  Constitutional:      Appearance: Normal appearance. He is normal weight.  HENT:     Head: Normocephalic.     Right Ear: Tympanic membrane and external ear normal.     Left Ear: Tympanic membrane and external ear normal.     Nose: Nose normal.  Eyes:     Extraocular Movements: Extraocular movements intact.     Pupils: Pupils are equal, round, and reactive to light.  Cardiovascular:     Rate and Rhythm: Normal rate and regular rhythm.  Pulmonary:     Effort: Pulmonary effort is normal.     Breath sounds: Normal breath sounds.  Abdominal:     General: Abdomen is flat.     Palpations: Abdomen is soft.  Musculoskeletal:        General: Normal range of motion.     Cervical back: Normal range of motion.  Neurological:     Mental Status: He is alert. Mental status is at baseline.   BP 124/78 (BP Location: Right Arm, Patient Position: Sitting, Cuff Size: Normal)   Pulse 76   Temp (!) 97.5 F (36.4 C) (Temporal)   Wt 140 lb 9.6 oz (63.8 kg)   SpO2 99%   BMI 22.69 kg/m  Wt Readings from Last 3 Encounters:  10/24/20 140 lb 9.6 oz (63.8 kg)  07/24/20 132 lb (59.9 kg)  11/30/19 126 lb 12.2 oz (57.5 kg)     Health Maintenance Due  Topic Date Due   COVID-19 Vaccine (1) Never done   Hepatitis C Screening  Never done   INFLUENZA VACCINE  Never done    There are no preventive care reminders to display for this patient.  Lab Results  Component Value Date   TSH 5.024 (H) 03/07/2019   Lab Results  Component Value Date   WBC 9.1 05/26/2020   HGB 13.4 05/26/2020   HCT 40.3 05/26/2020   MCV 92.0 05/26/2020   PLT 225 05/26/2020   Lab Results  Component Value Date   NA 137 05/26/2020   K 4.0 05/26/2020   CO2 23 05/26/2020   GLUCOSE 123 (H) 05/26/2020   BUN 15 05/26/2020   CREATININE 1.04 05/26/2020   BILITOT 1.0 11/29/2019   ALKPHOS 88  11/29/2019   AST 13 (L) 11/29/2019   ALT 12 11/29/2019   PROT 5.0 (L) 11/29/2019   ALBUMIN 2.9 (L) 11/29/2019   CALCIUM 8.0 (L) 05/26/2020   ANIONGAP 8 05/26/2020   Lab Results  Component Value Date   CHOL 132 03/07/2019   Lab Results  Component  Value Date   HDL 42 03/07/2019   Lab Results  Component Value Date   LDLCALC 80 03/07/2019   Lab Results  Component Value Date   TRIG 48 03/07/2019   Lab Results  Component Value Date   CHOLHDL 3.1 03/07/2019   Lab Results  Component Value Date   HGBA1C 4.6 (L) 03/07/2019      Assessment & Plan:  Cathan was seen today for follow-up.  Diagnoses and all orders for this visit:  Medication refill -     budesonide-formoterol (SYMBICORT) 160-4.5 MCG/ACT inhaler; Inhale 2 puffs into the lungs 2 (two) times daily. -     montelukast (SINGULAIR) 10 MG tablet; Take 1 tablet (10 mg total) by mouth at bedtime. -     albuterol (VENTOLIN HFA) 108 (90 Base) MCG/ACT inhaler; Inhale 2 puffs into the lungs every 6 (six) hours as needed for wheezing or shortness of breath.  Moderate persistent asthma without complication -     budesonide-formoterol (SYMBICORT) 160-4.5 MCG/ACT inhaler; Inhale 2 puffs into the lungs 2 (two) times daily. -     montelukast (SINGULAIR) 10 MG tablet; Take 1 tablet (10 mg total) by mouth at bedtime. -     albuterol (VENTOLIN HFA) 108 (90 Base) MCG/ACT inhaler; Inhale 2 puffs into the lungs every 6 (six) hours as needed for wheezing or shortness of breath.   Meds ordered this encounter  Medications   budesonide-formoterol (SYMBICORT) 160-4.5 MCG/ACT inhaler    Sig: Inhale 2 puffs into the lungs 2 (two) times daily.    Dispense:  3 each    Refill:  4   montelukast (SINGULAIR) 10 MG tablet    Sig: Take 1 tablet (10 mg total) by mouth at bedtime.    Dispense:  90 tablet    Refill:  3   albuterol (VENTOLIN HFA) 108 (90 Base) MCG/ACT inhaler    Sig: Inhale 2 puffs into the lungs every 6 (six) hours as needed for  wheezing or shortness of breath.    Dispense:  18 each    Refill:  3    Follow-up: Return in about 6 months (around 04/23/2021) for asthma management.    Kerin Perna, NP

## 2020-10-29 ENCOUNTER — Encounter (HOSPITAL_COMMUNITY): Payer: Medicaid Other | Admitting: Psychiatry

## 2020-10-29 ENCOUNTER — Other Ambulatory Visit (INDEPENDENT_AMBULATORY_CARE_PROVIDER_SITE_OTHER): Payer: Self-pay | Admitting: Primary Care

## 2020-10-29 DIAGNOSIS — Z760 Encounter for issue of repeat prescription: Secondary | ICD-10-CM

## 2020-10-29 DIAGNOSIS — J4540 Moderate persistent asthma, uncomplicated: Secondary | ICD-10-CM

## 2020-10-29 NOTE — Telephone Encounter (Signed)
Sent to PCP ?

## 2020-11-04 ENCOUNTER — Other Ambulatory Visit (HOSPITAL_COMMUNITY): Payer: Self-pay | Admitting: Psychiatry

## 2020-11-04 DIAGNOSIS — F32A Depression, unspecified: Secondary | ICD-10-CM

## 2020-11-04 DIAGNOSIS — F411 Generalized anxiety disorder: Secondary | ICD-10-CM

## 2020-11-05 ENCOUNTER — Ambulatory Visit (INDEPENDENT_AMBULATORY_CARE_PROVIDER_SITE_OTHER): Payer: Medicaid Other | Admitting: Licensed Clinical Social Worker

## 2020-11-05 ENCOUNTER — Other Ambulatory Visit: Payer: Self-pay

## 2020-11-05 DIAGNOSIS — F200 Paranoid schizophrenia: Secondary | ICD-10-CM | POA: Diagnosis not present

## 2020-11-05 DIAGNOSIS — F411 Generalized anxiety disorder: Secondary | ICD-10-CM

## 2020-11-05 NOTE — Progress Notes (Signed)
   THERAPIST PROGRESS NOTE  Session Time: 61  Participation Level: Active  Behavioral Response: CasualAlertAnxious and Depressed  Type of Therapy: Individual Therapy  Treatment Goals addressed: Diagnosis: schizophrenia    Interventions: CBT and Supportive  Summary: Isaiah Black is a 45 y.o. male who presents with euthymic mood\affect.  Patient was alert and oriented x5.  Patient was pleasant, cooperative, and engaged well in therapy session.  Isaiah Black maintained good eye contact and was dressed casually   Primary stressor for patient is financial and housing.  Patient reports that he is still looking for housing for the last 6 months since being approved for Social Security income.  Patient's primary very barrier is that he makes $650 per month and need subsidized or section 8 housing.  Other barriers for patient include being listed as a sex offender and cannot be within certain distances of the cares school or charges.  Patient reports that he is on a list for a "halfway house" and also is connected with the [employer].  Isaiah Black states that he is on wait list for both and is awaiting phone calls.  LCSW has been trying to get patient connected with [employer] for the past year.  Due to COVID restrictions and sex offender status this has been a difficult task.  Isaiah Black reports that he was supposed to have a virtual meeting with them in the last 30 days however missed it due to his mother being in the hospital with a UTI.  Isaiah Black was agreeable to contact [employer] for further assistance with rescheduling the appointment.   Suicidal/Homicidal: NAwithout intent/plan  Therapist Response:    Intervention/Place: Patient endorses symptoms for auditory and visual hallucinations about 2 times per week this has decreased since being with [facility] behavioral health as patient stated this was an every day occurrence prior to starting medication and therapy.  LCSW administered a GAD-7 and  patient scored a 14 last week he scored a 17.  LCSW used open-ended questions in today's session.  Interventions utilized were supportive therapy, cognitive behavioral therapy, and solution focused therapy.  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  Patient also to contact [facility] in the next 30 days to reschedule appointment.  Patient currently states that he is only showering 1 time a week LCSW sets and objective for 2-3 times per week.  Patient endorses symptoms for insomnia and states sometimes he gets too much sleep and other nights he gets no sleep.  Patient reports irregularly taking sleeping medications based on times and sometimes not taking it at all.  Patient reports he has an Alexa timer set for 11:30 PM at night LCSW set the objective to reset the timer for 10 PM on a consistent basis to get a true baseline of medication.   Plan: Return again in 4 weeks.     Isaiah Horn, LCSW 11/05/2020

## 2020-11-18 ENCOUNTER — Other Ambulatory Visit: Payer: Self-pay

## 2020-11-18 ENCOUNTER — Ambulatory Visit (HOSPITAL_COMMUNITY): Payer: Medicaid Other | Admitting: *Deleted

## 2020-11-18 ENCOUNTER — Encounter (HOSPITAL_COMMUNITY): Payer: Self-pay

## 2020-11-18 VITALS — BP 120/60 | HR 50 | Ht 66.0 in | Wt 139.0 lb

## 2020-11-18 DIAGNOSIS — F200 Paranoid schizophrenia: Secondary | ICD-10-CM

## 2020-11-18 NOTE — Progress Notes (Signed)
In as scheduled for his monthly injection. Today he got his shot in his R DELTOID without issue. He is in good spirits today, made a key chain for both myself and Reese. He is staying busy with his multiple projects. Mom with him as always. No complaints offered. He is to return in one month for his next injection. Stable and at baseline at this time.

## 2020-12-09 ENCOUNTER — Other Ambulatory Visit: Payer: Self-pay

## 2020-12-09 ENCOUNTER — Ambulatory Visit (INDEPENDENT_AMBULATORY_CARE_PROVIDER_SITE_OTHER): Payer: Medicaid Other | Admitting: Licensed Clinical Social Worker

## 2020-12-09 DIAGNOSIS — F200 Paranoid schizophrenia: Secondary | ICD-10-CM

## 2020-12-09 DIAGNOSIS — F411 Generalized anxiety disorder: Secondary | ICD-10-CM

## 2020-12-09 NOTE — Progress Notes (Signed)
   THERAPIST PROGRESS NOTE  Session Time: 96   Participation Level: Active  Behavioral Response: CasualAlertAnxious and Depressed  Type of Therapy: Individual Therapy  Treatment Goals addressed: Diagnosis: schizophrenia   Interventions: CBT, Supportive, and Reframing  Summary: Lio Wehrly is a 45 y.o. male who presents with flat and anxious mood\affect.  Patient was pleasant, cooperative, and maintained good eye contact.  He engaged well in therapy session and was dressed casually.  Patient presents today with his mother for follow-up therapy session.  Patient reports primary stressors are anxiety, sleep, and housing.  Patient reports that he has been trying to obtain subsidized housing for the past year.  He has just recently come into his income for SSI for $600 a month.  He has been trying to work with [employer] for housing, however patient reports that [employer] has lost their funding for housing at this time.  LCSW provided solution focused therapy in today's session for partners ending homelessness in [place].  LCSW provided paperwork for contact information and patient to follow-up with partners to and homelessness in the next 4 weeks before next session.  Patient reports anxiety for tension and worry "a few times a week".  He states that these tend to happen when he has not gotten enough sleep or has not had a proper nutritional diet, example being forgetting to eat.  Some good news for patient is that he has obtained more 3D printers.  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  Patient reports he now has 3 3D printers that are all functional.  Barney's overall goal is to subsidized some of his income by selling some of his products.  Patient reports that some negative effects from his hobby\business is that he will forget to go to sleep and fight his  medications to fall asleep  Suicidal/Homicidal: NAwithout intent/plan  Therapist Response:    Intervention/Plan: Interventions utilized in today's session were supportive therapy, person centered therapy, and cognitive behavioral therapy.  LCSW utilized interventions such as reframing and cognitive restructuring in today's session.  LCSW utilized language for praise, encouragement, empowerment, and genuineness in session.  Solution focused therapy was utilized for housing resources as stated above.  Plan for patient is to follow-up with [facility] in the next 4 weeks and also with partners ending homelessness in the next 4 weeks. Plan: Return again in 4 weeks.     Dory Horn, LCSW 12/09/2020

## 2020-12-15 ENCOUNTER — Other Ambulatory Visit (HOSPITAL_COMMUNITY): Payer: Self-pay | Admitting: Psychiatry

## 2020-12-15 DIAGNOSIS — F200 Paranoid schizophrenia: Secondary | ICD-10-CM

## 2020-12-23 ENCOUNTER — Ambulatory Visit (HOSPITAL_COMMUNITY): Payer: Medicaid Other | Admitting: *Deleted

## 2020-12-23 ENCOUNTER — Other Ambulatory Visit: Payer: Self-pay

## 2020-12-23 ENCOUNTER — Encounter (HOSPITAL_COMMUNITY): Payer: Self-pay

## 2020-12-23 VITALS — BP 107/73 | HR 96 | Ht 66.0 in | Wt 141.0 lb

## 2020-12-23 DIAGNOSIS — F200 Paranoid schizophrenia: Secondary | ICD-10-CM

## 2020-12-23 MED ORDER — ARIPIPRAZOLE ER 400 MG IM PRSY
400.0000 mg | PREFILLED_SYRINGE | INTRAMUSCULAR | Status: AC
  Administered 2021-01-20 – 2024-02-16 (×38): 400 mg via INTRAMUSCULAR

## 2020-12-23 NOTE — Progress Notes (Signed)
In as scheduled for his monthly injection of Abilify M 400 mg given today in his L DELTOID without difficulty. He had a number of pictures on his phone to show staff of the projects he has been working on. His mom is not with him today as she has an appt this am with the housing authority. He offers no complaints. He is due back in one month for his next shot. He is at his baseline.

## 2020-12-30 ENCOUNTER — Other Ambulatory Visit (HOSPITAL_COMMUNITY): Payer: Self-pay | Admitting: Psychiatry

## 2020-12-30 DIAGNOSIS — G2401 Drug induced subacute dyskinesia: Secondary | ICD-10-CM

## 2021-01-06 ENCOUNTER — Other Ambulatory Visit: Payer: Self-pay

## 2021-01-06 ENCOUNTER — Other Ambulatory Visit (HOSPITAL_COMMUNITY): Payer: Self-pay | Admitting: Psychiatry

## 2021-01-06 ENCOUNTER — Ambulatory Visit (HOSPITAL_COMMUNITY): Payer: Medicaid Other | Admitting: Licensed Clinical Social Worker

## 2021-01-06 DIAGNOSIS — G2401 Drug induced subacute dyskinesia: Secondary | ICD-10-CM

## 2021-01-06 DIAGNOSIS — F200 Paranoid schizophrenia: Secondary | ICD-10-CM

## 2021-01-06 MED ORDER — VALBENAZINE TOSYLATE 40 MG PO CAPS: ORAL_CAPSULE | ORAL | 3 refills | Status: DC

## 2021-01-06 NOTE — Progress Notes (Signed)
   THERAPIST PROGRESS NOTE  Session Time: 30  Participation Level: Active  Behavioral Response: CasualAlertAnxious  Type of Therapy: Individual Therapy  Treatment Goals addressed: Coping and Diagnosis: schizophrenia   Interventions: CBT, Motivational Interviewing, and Strength-based  Summary: Isaiah Black is a 45 y.o. male who presents with restless and anxious mood\affect.  He engaged well in therapy session and was dressed casually.  Patient was pleasant, cooperative, and maintained good eye contact.  Patient presents today with his mother in session.  Patient was restless as evidenced by leg moving up and down.  Patient reports primary stressor is medication management and stated that his last prescription to a national pharmacy was declined at.  Upon review by this LCSW prescription air did occur on 11/29.  Dr. Ronne Binning was notified and resent prescription.  Other stressors for patient are social interactions per mother.  LCSW inquired if he has followed up with [employer].  Patient reports that he has missed several intake appointments.  He has following up to reschedule an appointment and they stated that they would get back to them with another appointment this was 3 weeks ago LCSW stated he should follow-up via email the next week.  Patient was agreeable to plan.   Mother also states that patient is struggling with scheduling.  LCSW tasked patient with writing down appointment x1 time weekly.  Isaiah Black endorses symptoms for insomnia.  Patient reports that he takes his sleeping medication at 10:30 PM but does not go to sleep until 3 in the morning.  Mother states that patient does appear tired and will start to fall asleep but force himself to stay up to work on his [place].  LCSW test patient with taking medication and attempting to go to bed within 2 hours of taking the medication  Suicidal/Homicidal: NAwithout intent/plan  Therapist Response:    Interventions/Plan: LCSW's  primary intervention where person centered and supportive therapy.  Language was utilized for this LCSW for praise, encouragement, and empowerment.  LCSW educated patient on signs and symptoms of schizophrenia and anxiety disorders.  LCSW and patient talked about proper use of medications to use them as directed per prescription bottles.  Plan for patient is to follow-up in 4 weeks  Plan: Return again in 4 weeks.     Dory Horn, LCSW 01/06/2021

## 2021-01-20 ENCOUNTER — Other Ambulatory Visit (HOSPITAL_COMMUNITY): Payer: Self-pay | Admitting: Psychiatry

## 2021-01-20 ENCOUNTER — Other Ambulatory Visit: Payer: Self-pay

## 2021-01-20 ENCOUNTER — Encounter (HOSPITAL_COMMUNITY): Payer: Self-pay

## 2021-01-20 ENCOUNTER — Ambulatory Visit (INDEPENDENT_AMBULATORY_CARE_PROVIDER_SITE_OTHER): Payer: Medicaid Other | Admitting: Family

## 2021-01-20 ENCOUNTER — Ambulatory Visit (HOSPITAL_COMMUNITY): Payer: Medicaid Other | Admitting: *Deleted

## 2021-01-20 VITALS — BP 172/111 | HR 101 | Ht 66.0 in | Wt 144.0 lb

## 2021-01-20 DIAGNOSIS — G2401 Drug induced subacute dyskinesia: Secondary | ICD-10-CM

## 2021-01-20 DIAGNOSIS — F200 Paranoid schizophrenia: Secondary | ICD-10-CM | POA: Diagnosis not present

## 2021-01-20 DIAGNOSIS — F331 Major depressive disorder, recurrent, moderate: Secondary | ICD-10-CM | POA: Diagnosis not present

## 2021-01-20 MED ORDER — ARIPIPRAZOLE ER 400 MG IM PRSY
400.0000 mg | PREFILLED_SYRINGE | INTRAMUSCULAR | Status: AC
  Administered 2021-02-17 – 2021-03-17 (×2): 400 mg via INTRAMUSCULAR

## 2021-01-20 MED ORDER — VALBENAZINE TOSYLATE 40 MG PO CAPS: ORAL_CAPSULE | ORAL | 3 refills | Status: DC

## 2021-01-20 NOTE — Progress Notes (Signed)
In for his monthly injection of Abilify M 400 mg given today in his R DELTOID without issue. His mom is with him for his visit as is the ususal process for him. He made Christmas gifts for much of the staff and given a lot of praise and thanks for the gifts they gave [place]. His vitals were elevated possibly with all the commontion and he does have a PCP he is managed thru. He was seen by Otila Kluver NP as well for his every 3 month assessment. He is to return in one month for his next shot.

## 2021-01-20 NOTE — Progress Notes (Signed)
BH MD/PA/NP OP Progress Note  01/20/2021 10:05 AM Isaiah Black  MRN:  [id number]  Chief Complaint:  HPI:  Isaiah Black is a 45 year old male seen today for follow-up psychiatric evaluation. Psychiatric history includes major depressive disorder, paranoid schizophrenia and tardive dyskinesia, generalized anxiety disorder.   He has been managed historically on Abilify Maintenna 400mg  monthly, Remeron 7.5 mg nightly and buspirone 30mg  BID, hydroxyzine 50mg  PRN TID, Ingrezza 40mg  daily.  Isaiah Black is compliant with medications. Reports he feels that his mood is managed effectively using these medications along with outpatient therapy. Isaiah Black meets with Quita Skye, outpatient therapy, at [facility].  Recent stressors include a desire to see his daughter who resides in another state. He is encouraged by his ability to often video chat with daughter and is looking forward to an opportunity to visit with his daughter "in-person" at his first available opportunity.  Isaiah Black has history of asthma, reports he is compliant with current medications as directed by PCP. He meets with PCP regularly.   Patient is assessed face-to-face by nurse practitioner, chart reviewed.    Today patient is seated, no acute distress. Patient's mother remains present during assessment, per patient preference. He is appropriately groomed. He is alert and oriented, pleasant and cooperative. He has clear and coherent speech average volume.  Behavior calm and appropriate, with good eye contact.   He reports euthymic mood, congruent affect..   Today provider conducted a PHQ-2, patient scored a 1, at last visit on 10/24/2020 he scored a 1 as well.  He denies suicidal and homicidal ideations currently.  He contracts verbally for safety with this Probation officer.    He denies both auditory and visual hallucinations currently. Both Jamarcus and his mother reports feeling that there is "a ghost in the house." Patient and his mother are confident  that the "ghost" in the home is the spirit of Isaiah Black's father who passed away approx. seven years ago.    There is no indication that he is responding to internal stimuli, no evidence of delusional thought content.  Patient is able to converse coherently with goal-directed thoughts and no distractibility or preoccupation. He denies paranoia.  Objectively there is no evidence of psychosis/mania or delusional thinking. Patient is insightful regarding treatment and diagnosis.   Patient is tolerating medications with no adverse effects/reactions per his report.  Patient reports average sleep,reports this is an improvement. No nightmares reported recently.   He denies physical pain currently.   He denies both alcohol and substance use in the last two months.   Patient provided support and encouragement.  Patient to receive monthly LAI Abilify Maintena 400mg  monthly. Plan to follow-up in one month for Abilify Maintena monthly injection.    Visit Diagnosis:    ICD-10-CM   1. Paranoid schizophrenia ([place])  F20.0     2. Major depressive disorder, recurrent episode, moderate ([facility])  F33.1       Past Psychiatric History: MDD, GAD, paranoid schizophrenia  Past Medical History:  Past Medical History:  Diagnosis Date   Anxiety    Disassociation disorder    Paranoid schizophrenia ([place])    No past surgical history on file.  Family Psychiatric History: none reported  Family History: No family history on file.  Social History:  Social History   Socioeconomic History   Marital status: Single    Spouse name: Not on file   Number of children: 1   Years of education: Not on file   Highest education level: Not on file  Occupational History   Not on file  Tobacco Use   Smoking status: Former   Smokeless tobacco: Never  Vaping Use   Vaping Use: Former  Substance and Sexual Activity   Alcohol use: Not Currently    Comment: occ   Drug use: Not Currently   Sexual activity: Not Currently  Other  Topics Concern   Not on file  Social History Narrative   Not on file   Social Determinants of Health   Financial Resource Strain: Low Risk    Difficulty of Paying Living Expenses: Not hard at all  Food Insecurity: No Food Insecurity   Worried About Charity fundraiser in the Last Year: Never true   Arboriculturist in the Last Year: Never true  Transportation Needs: No Transportation Needs   Lack of Transportation (Medical): No   Lack of Transportation (Non-Medical): No  Physical Activity: Sufficiently Active   Days of Exercise per Week: 7 days   Minutes of Exercise per Session: 60 min  Stress: No Stress Concern Present   Feeling of Stress : Only a little  Social Connections: Socially Isolated   Frequency of Communication with Friends and Family: More than three times a week   Frequency of Social Gatherings with Friends and Family: Twice a week   Attends Religious Services: Never   Marine scientist or Organizations: No   Attends Archivist Meetings: Never   Marital Status: Never married    Allergies:  Allergies  Allergen Reactions   Cogentin [Benztropine]     Metabolic Disorder Labs: Lab Results  Component Value Date   HGBA1C 4.6 (L) 03/07/2019   MPG 85.32 03/07/2019   MPG 99.67 07/08/2017   Lab Results  Component Value Date   PROLACTIN 100.0 (H) 03/07/2019   PROLACTIN 51.1 (H) 07/08/2017   Lab Results  Component Value Date   CHOL 132 03/07/2019   TRIG 48 03/07/2019   HDL 42 03/07/2019   CHOLHDL 3.1 03/07/2019   VLDL 10 03/07/2019   LDLCALC 80 03/07/2019   LDLCALC 109 (H) 07/08/2017   Lab Results  Component Value Date   TSH 5.024 (H) 03/07/2019   TSH 6.137 (H) 07/08/2017    Therapeutic Level Labs: No results found for: LITHIUM No results found for: VALPROATE No components found for:  CBMZ  Current Medications: Current Outpatient Medications  Medication Sig Dispense Refill   ABILIFY MAINTENA 400 MG PRSY prefilled syringe INJECT  EVERY 28 TO 30 DAYS 1 each 10   albuterol (VENTOLIN HFA) 108 (90 Base) MCG/ACT inhaler Inhale 2 puffs into the lungs every 6 (six) hours as needed for wheezing or shortness of breath. 18 each 3   budesonide-formoterol (SYMBICORT) 160-4.5 MCG/ACT inhaler Inhale 2 puffs into the lungs 2 (two) times daily. 3 each 4   busPIRone (BUSPAR) 15 MG tablet Take 2 tablets (30 mg total) by mouth 2 (two) times daily. 60 tablet 2   hydrOXYzine (ATARAX/VISTARIL) 50 MG tablet Take 1 tablet (50 mg total) by mouth 3 (three) times daily as needed. 90 tablet 2   mirtazapine (REMERON) 7.5 MG tablet TAKE 1 TABLET(7.5 MG) BY MOUTH AT BEDTIME 30 tablet 2   montelukast (SINGULAIR) 10 MG tablet Take 1 tablet (10 mg total) by mouth at bedtime. 90 tablet 3   valbenazine (INGREZZA) 40 MG capsule TAKE 1 CAPSULE(40 MG) BY MOUTH DAILY 30 capsule 3   Current Facility-Administered Medications  Medication Dose Route Frequency Provider Last Rate Last Admin   ARIPiprazole  ER (ABILIFY MAINTENA) 400 MG prefilled syringe 400 mg  400 mg Intramuscular Q28 days Lucky Rathke, FNP   400 mg at 01/20/21 6213     Musculoskeletal: Strength & Muscle Tone: within normal limits Gait & Station: normal Patient leans: N/A  Psychiatric Specialty Exam: Review of Systems  Constitutional: Negative.   HENT: Negative.    Eyes: Negative.   Respiratory: Negative.    Cardiovascular: Negative.   Gastrointestinal: Negative.   Genitourinary: Negative.   Musculoskeletal: Negative.   Skin: Negative.   Neurological: Negative.   Hematological: Negative.   Psychiatric/Behavioral: Negative.     There were no vitals taken for this visit.There is no height or weight on file to calculate BMI.  General Appearance: Casual  Eye Contact:  Good  Speech:  Clear and Coherent and Normal Rate  Volume:  Normal  Mood:  Euthymic  Affect:  Appropriate and Congruent  Thought Process:  Coherent, Goal Directed, and Linear  Orientation:  Full (Time, Place, and  Person)  Thought Content: Logical   Suicidal Thoughts:  No  Homicidal Thoughts:  No  Memory:  Immediate;   Good Recent;   Good Remote;   Good  Judgement:  Good  Insight:  Good  Psychomotor Activity:  Normal  Concentration:  Concentration: Good and Attention Span: Good  Recall:  Good  Fund of Knowledge: Good  Language: Good  Akathisia:  No  Handed:  Right  AIMS (if indicated): done  Assets:  Communication Skills Desire for Improvement Financial Resources/Insurance Housing Intimacy Leisure Time Physical Health Resilience Social Support Talents/Skills  ADL's:  Intact  Cognition: WNL  Sleep:  Fair   Screenings: AIMS    Flowsheet Row Clinical Support from 10/31/2019 in [facility] Admission (Discharged) from 07/06/2017 in [place] Total Score 5 0      AUDIT    Flowsheet Row Admission (Discharged) from 03/06/2019 in [place] Admission (Discharged) from 07/06/2017 in [place] [facility]  Alcohol Use Disorder Identification Test Final Score (AUDIT) 25 2      CAGE-AID    Flowsheet Row Counselor from 07/03/2019 in [facility] Score 0      GAD-7    Flowsheet Row Counselor from 11/05/2020 in [facility] Office Visit from 10/24/2020 in [place] from 10/21/2020 in [facility] Counselor from 10/07/2020 in [facility] Counselor from 09/03/2020 in [facility]  Total GAD-7 Score 14 17 4 10 10       PHQ2-9    [place] Office Visit from 01/20/2021 in [facility] Office Visit from 10/24/2020 in [place] from 10/21/2020 in [facility] Counselor from 10/07/2020 in [facility] Counselor from 09/03/2020 in [place]  PHQ-2 Total Score 1 1 2 3 1   PHQ-9 Total Score -- -- 8 18 13       [place] Visit from 01/20/2021 in [place] from 10/21/2020 in [place] from 07/29/2020 in [place] No Risk No Risk Error: Q7 should not be populated when Q6 is No        Assessment and Plan: Patient reviewed with Dr [place] Abbot. Patient  to return in approx. 30 days for Abilify Maintenna 400 mg injection.   Lucky Rathke, FNP 01/20/2021, 10:05 AM

## 2021-01-21 ENCOUNTER — Encounter (HOSPITAL_COMMUNITY): Payer: Self-pay | Admitting: Family

## 2021-02-05 ENCOUNTER — Ambulatory Visit (HOSPITAL_COMMUNITY): Payer: Medicaid Other | Admitting: Licensed Clinical Social Worker

## 2021-02-10 ENCOUNTER — Ambulatory Visit (INDEPENDENT_AMBULATORY_CARE_PROVIDER_SITE_OTHER): Payer: Medicaid Other | Admitting: Licensed Clinical Social Worker

## 2021-02-10 ENCOUNTER — Other Ambulatory Visit: Payer: Self-pay

## 2021-02-10 DIAGNOSIS — F200 Paranoid schizophrenia: Secondary | ICD-10-CM

## 2021-02-10 DIAGNOSIS — F411 Generalized anxiety disorder: Secondary | ICD-10-CM

## 2021-02-10 NOTE — Progress Notes (Signed)
° °  THERAPIST PROGRESS NOTE  Session Time: 30   Participation Level: Active  Behavioral Response: CasualAlertAnxious and Depressed  Type of Therapy: Individual Therapy  Treatment Goals addressed: Anxiety, Coping, and Diagnosis: schizophrenia   Interventions: CBT, Motivational Interviewing, and Solution Focused  Summary: Isaiah Black is a 46 y.o. male who presents with anxious mood\affect.  Patient was alert and oriented x5.  Isaiah Black was pleasant, cooperative, and maintained good eye contact.  He engaged well in therapy session was dressed casually.  Today patient presented with his mother for follow-up therapy session.  Primary stressors stated by patient are her sleep and housing.  Patient reports that "everyone has left me post being released from prison that has attempted to help me find housing".  Patient and mother both report that they are struggling finding housing for a sex offender for subsidized housing.  This is because tension and worry for patient.  They have been in consistent contact with the parole officer who is aware of his living situation and how near a school it is.  At this time the parole officer and the sheriff's department is okay with him staying with his mother until proper housing within the limitations of his parole can be obtained.  Other stressors reported by patient are insomnia.  Patient reports not falling asleep until 5 in the morning on most nights.  Patient reports doing things such as 3D printing and watching television example being "911 calls on YouTube".   Suicidal/Homicidal: Nowithout intent/plan  Therapist Response:    Intervention/Plan: Interventions utilized in today's session were supportive therapy, motivational interviewing, person centered therapy, and solution focused therapy.  LCSW provided patient with resources for housing through the [employer] resources included [place], [facility], and  Solicitor.  LCSW utilized language for praise, encouragement and empowerment.  LCSW spoke with patient and educated patient on signs and symptoms of auditory and visual hallucinations for schizophrenia.  LCSW went over symptoms for insomnia with patient plan for patient moving forward in regards to insomnia will be to set an alarm nightly to remind him to go to bed and take his nighttime medications.  Patient will also follow-up with resources listed above at least 1 time before next session.  Plan: Return again in 4 weeks.     Dory Horn, LCSW 02/10/2021

## 2021-02-17 ENCOUNTER — Other Ambulatory Visit: Payer: Self-pay

## 2021-02-17 ENCOUNTER — Ambulatory Visit (INDEPENDENT_AMBULATORY_CARE_PROVIDER_SITE_OTHER): Payer: Medicaid Other | Admitting: *Deleted

## 2021-02-17 ENCOUNTER — Encounter (HOSPITAL_COMMUNITY): Payer: Self-pay

## 2021-02-17 VITALS — BP 132/75 | HR 64 | Ht 66.0 in | Wt 152.0 lb

## 2021-02-17 DIAGNOSIS — F200 Paranoid schizophrenia: Secondary | ICD-10-CM

## 2021-02-17 NOTE — Progress Notes (Signed)
In with mom as is the usual routine for his monthly injection of Abilify M 400 mg. Today he got his shot in his L DELTOID without issue. The two  of them both continue to work on multiple craft projections and get a lot of attention and praise for the work they do. He denies any issues and offers no complaints. He is to return in one month for his next injection.

## 2021-02-24 ENCOUNTER — Other Ambulatory Visit (HOSPITAL_COMMUNITY): Payer: Self-pay | Admitting: Psychiatry

## 2021-02-24 DIAGNOSIS — F411 Generalized anxiety disorder: Secondary | ICD-10-CM

## 2021-02-24 DIAGNOSIS — F32A Depression, unspecified: Secondary | ICD-10-CM

## 2021-03-17 ENCOUNTER — Other Ambulatory Visit: Payer: Self-pay

## 2021-03-17 ENCOUNTER — Ambulatory Visit (HOSPITAL_COMMUNITY): Payer: Medicaid Other | Admitting: *Deleted

## 2021-03-17 ENCOUNTER — Encounter (HOSPITAL_COMMUNITY): Payer: Self-pay

## 2021-03-17 VITALS — BP 134/90 | HR 94 | Ht 64.0 in | Wt 154.0 lb

## 2021-03-17 DIAGNOSIS — F200 Paranoid schizophrenia: Secondary | ICD-10-CM

## 2021-03-17 NOTE — Progress Notes (Signed)
In with mom for his monthly injection of Abilify M 400 mg, given today in his R DELTOID without issues. He continues to get nervous before getting his shot. He brought staff some flowers he did with his 3D printer and was given much thanks for doing them, they are beautiful and thoughtful on Valentines Day. He offers no complaints. He is staying busy with his multiple craft projects. He is to return in one month for his next shot.

## 2021-03-24 ENCOUNTER — Ambulatory Visit (HOSPITAL_COMMUNITY): Payer: Medicaid Other | Admitting: Licensed Clinical Social Worker

## 2021-03-24 ENCOUNTER — Other Ambulatory Visit: Payer: Self-pay

## 2021-03-24 ENCOUNTER — Telehealth (HOSPITAL_COMMUNITY): Payer: Self-pay | Admitting: Licensed Clinical Social Worker

## 2021-03-24 DIAGNOSIS — F200 Paranoid schizophrenia: Secondary | ICD-10-CM

## 2021-03-24 DIAGNOSIS — F411 Generalized anxiety disorder: Secondary | ICD-10-CM

## 2021-03-24 NOTE — Progress Notes (Signed)
° °  THERAPIST PROGRESS NOTE  Session Time: 59  Participation Level: Active  Behavioral Response: CasualAlertAnxious and Depressed  Type of Therapy: Individual Therapy  Treatment Goals addressed: Updated treatment plan  ProgressTowards Goals: Initial  Interventions: CBT, Motivational Interviewing, and Supportive  Summary: Isaiah Black is a 46 y.o. male who presents with anxious mood\affect.  Patient was pleasant, cooperative, maintained good eye contact.  He engaged well in therapy session and was alert and oriented x5.  Patient's primary stressors are financials, legal, housing.  Patient reports that he is still trying to obtain housing this is about 6 months post getting Social Security disability approved.  Patient reports primary barriers is his status as a sex offender.  LCSW provided patient resources last session for housing resources in [place].  Patient reports that he has lost those resources.  LCSW [place] provided those resources again today.  [name] also reports reoccurring nightmares about 4 out of the 7 days/week.  Patient reports that these nightmares are detailed by his time in jail.  Suicidal/Homicidal: Nowithout intent/plan  Therapist Response:     Intervention/Plan: LCSW provided patient interventions for reframing, cognitive behavioral therapy, supportive therapy, and solution focused therapy.  LCSW provided patient resources as evidenced by note written above.  Plan for patient to utilize those resources to follow-up with at least 1 housing resources in the next 4 weeks.  Patient also to look into bus route between [place] to help expand his search.  Patient to follow-up with the bus company or online website in the next 4 weeks at least 1 time.  LCSW utilized language for praise, encouragement, and empowerment.  LCSW updated treatment plan.  Patient to follow-up in 4 weeks  Plan: Return again in 4 weeks.  Diagnosis: Paranoid schizophrenia  ([place])  Generalized anxiety disorder  Collaboration of Care: Other Referral to [facility] for Peer support/advocacy   Patient/Guardian was advised Release of Information must be obtained prior to any record release in order to collaborate their care with an outside provider. Patient/Guardian was advised if they have not already done so to contact the registration department to sign all necessary forms in order for [place] to release information regarding their care.   Consent: Patient/Guardian gives verbal consent for treatment and assignment of benefits for services provided during this visit. Patient/Guardian expressed understanding and agreed to proceed.   Dory Horn, LCSW 03/24/2021

## 2021-03-24 NOTE — Plan of Care (Signed)
Pt agreeable to plan  ?

## 2021-03-24 NOTE — Telephone Encounter (Signed)
LCSW called patient to follow-up on [facility].  [facility] was notified that [facility].  [facility] at 10 AM at [place]. [facility].  Patient was notified and agreeable verbally.  Patient requested that email be sent to him with the information as he was out of the store.  LCSW sent email to patient at 2:01 PM.

## 2021-04-14 ENCOUNTER — Encounter (HOSPITAL_COMMUNITY): Payer: Self-pay

## 2021-04-14 ENCOUNTER — Ambulatory Visit (HOSPITAL_COMMUNITY): Payer: Medicaid Other | Admitting: *Deleted

## 2021-04-14 ENCOUNTER — Encounter (HOSPITAL_COMMUNITY): Payer: Self-pay | Admitting: Licensed Clinical Social Worker

## 2021-04-14 ENCOUNTER — Other Ambulatory Visit: Payer: Self-pay

## 2021-04-14 ENCOUNTER — Ambulatory Visit (INDEPENDENT_AMBULATORY_CARE_PROVIDER_SITE_OTHER): Payer: Medicaid Other | Admitting: Licensed Clinical Social Worker

## 2021-04-14 ENCOUNTER — Ambulatory Visit (HOSPITAL_COMMUNITY): Payer: Medicaid Other

## 2021-04-14 VITALS — BP 111/73 | HR 68 | Ht 64.0 in | Wt 156.0 lb

## 2021-04-14 VITALS — BP 111/73

## 2021-04-14 DIAGNOSIS — F200 Paranoid schizophrenia: Secondary | ICD-10-CM

## 2021-04-14 DIAGNOSIS — F411 Generalized anxiety disorder: Secondary | ICD-10-CM

## 2021-04-14 NOTE — Progress Notes (Unsigned)
In as scheduled for his monthly injection. He had a therapy appt first and his therapist had asked him to come to his session without his mom so she is not with him today as she usually is. He states he is doing well, at his baseline and using his time to work on his crafts. He offers no complaints. He shared pictures of recent projects form both himself and his mom are doing. He was to see a provider today, but due to conflict with her time, rescheduled for next month. No complaints offered.  ?

## 2021-04-14 NOTE — Plan of Care (Signed)
Pt decreased GAD-7 to 10 from 14  ?

## 2021-04-14 NOTE — Progress Notes (Signed)
? ?  THERAPIST PROGRESS NOTE ? ?Session Time: 40 ? ?Participation Level: Active ? ?Behavioral Response: CasualAlertAnxious and Depressed ? ?Type of Therapy: Individual Therapy ? ?Treatment Goals addressed: LTG: Patient will score less than 5 on the Generalized Anxiety Disorder 7 Scale  ?(GAD-7) ? ?ProgressTowards Goals: Progressing ? ?Interventions: CBT and Motivational Interviewing ? ?Summary: Isaiah Black is a 46 y.o. male who presents with depressed and anxious mood\affect.  Patient was pleasant, cooperative, maintained good eye contact.  He engaged well in therapy session and was dressed casually. ? Primary stressor for patient is family conflict, financials, and housing.  LCSW spoke today about a follow-[facility].  LCSW did send a referral over to them for their day program.  Patient reports that he conducted the assessment on March 8 and they stated to him that they would contact him in about a week.  Patient reports that they still have not contacted him.  LCSW advised patient to wait until Friday and if no contact is made to follow-up with [facility].  Isaiah Black was agreeable to plan.  Patient reports that he had a contact with his daughter Isaiah Black.  Patient reports that she continues to live with her mother out to [place].  Isaiah Black states that he misses her "a lot".  He reports having regularly scheduled time to FaceTime with her. ? ?Suicidal/Homicidal: Nowithout intent/plan ? ?Therapist Response:  ? ?Interventions\plan: LCSW administered GAD-7.  Patient decrease core from 14 to a 10.  LCSW administered PHQ-9 and patient to increase score from a 6 to a 12.  Patient reports increases due to his shot being due today.  Patient reports that he is scheduled to get his shot today at 9 AM.  Plan for patient is to follow-up at least 1 time with [facility].  Patient to increase walking to at least 2 times per week goal and objective is 3 times per week. ?  ? ?Plan: Return again in 4  weeks. ? ?Diagnosis: Paranoid schizophrenia ([place]) ? ?Collaboration of Care: Other Referral to [facility]  ? ?Patient/Guardian was advised Release of Information must be obtained prior to any record release in order to collaborate their care with an outside provider. Patient/Guardian was advised if they have not already done so to contact the registration department to sign all necessary forms in order for [place] to release information regarding their care.  ? ?Consent: Patient/Guardian gives verbal consent for treatment and assignment of benefits for services provided during this visit. Patient/Guardian expressed understanding and agreed to proceed.  ? ?Dory Horn, LCSW ?04/14/2021 ? ?

## 2021-04-23 ENCOUNTER — Encounter (INDEPENDENT_AMBULATORY_CARE_PROVIDER_SITE_OTHER): Payer: Self-pay | Admitting: Primary Care

## 2021-04-23 ENCOUNTER — Ambulatory Visit (INDEPENDENT_AMBULATORY_CARE_PROVIDER_SITE_OTHER): Payer: Medicaid Other | Admitting: Primary Care

## 2021-04-23 ENCOUNTER — Other Ambulatory Visit: Payer: Self-pay

## 2021-04-23 VITALS — BP 124/81 | HR 82 | Temp 97.8°F | Wt 159.6 lb

## 2021-04-23 DIAGNOSIS — Z760 Encounter for issue of repeat prescription: Secondary | ICD-10-CM | POA: Diagnosis not present

## 2021-04-23 DIAGNOSIS — R21 Rash and other nonspecific skin eruption: Secondary | ICD-10-CM | POA: Diagnosis not present

## 2021-04-23 DIAGNOSIS — J4540 Moderate persistent asthma, uncomplicated: Secondary | ICD-10-CM

## 2021-04-23 MED ORDER — TRIAMCINOLONE ACETONIDE 0.1 % EX CREA: 1.0000 "application " | TOPICAL_CREAM | Freq: Two times a day (BID) | CUTANEOUS | 1 refills | Status: DC

## 2021-04-23 MED ORDER — BUDESONIDE-FORMOTEROL FUMARATE 160-4.5 MCG/ACT IN AERO: 2.0000 | INHALATION_SPRAY | Freq: Two times a day (BID) | RESPIRATORY_TRACT | 4 refills | Status: DC

## 2021-04-23 MED ORDER — MONTELUKAST SODIUM 10 MG PO TABS: 10.0000 mg | ORAL_TABLET | Freq: Every day | ORAL | 3 refills | Status: DC

## 2021-04-23 MED ORDER — NYSTATIN-TRIAMCINOLONE 100000-0.1 UNIT/GM-% EX OINT: 1.0000 "application " | TOPICAL_OINTMENT | Freq: Two times a day (BID) | CUTANEOUS | 1 refills | Status: DC

## 2021-04-23 MED ORDER — ALBUTEROL SULFATE HFA 108 (90 BASE) MCG/ACT IN AERS: 2.0000 | INHALATION_SPRAY | Freq: Four times a day (QID) | RESPIRATORY_TRACT | 3 refills | Status: DC | PRN

## 2021-04-23 NOTE — Patient Instructions (Signed)
Asthma, Adult ?Asthma is a long-term (chronic) condition in which the airways get tight and narrow. The airways are the breathing passages that lead from the nose and mouth down into the lungs. A person with asthma will have times when symptoms get worse. These are called asthma attacks. They can cause coughing, whistling sounds when you breathe (wheezing), shortness of breath, and chest pain. They can make it hard to breathe. There is no cure for asthma, but medicines and lifestyle changes can help control it. ?There are many things that can bring on an asthma attack or make asthma symptoms worse (triggers). Common triggers include: ?Mold. ?Dust. ?Cigarette smoke. ?Cockroaches. ?Things that can cause allergy symptoms (allergens). These include animal skin flakes (dander) and pollen from trees or grass. ?Things that pollute the air. These may include household cleaners, wood smoke, smog, or chemical odors. ?Cold air, weather changes, and wind. ?Crying or laughing hard. ?Stress. ?Certain medicines or drugs. ?Certain foods such as dried fruit, potato chips, and grape juice. ?Infections, such as a cold or the flu. ?Certain medical conditions or diseases. ?Exercise or tiring activities. ?Asthma may be treated with medicines and by staying away from the things that cause asthma attacks. Types of medicines may include: ?Controller medicines. These help prevent asthma symptoms. They are usually taken every day. ?Fast-acting reliever or rescue medicines. These quickly relieve asthma symptoms. They are used as needed and provide short-term relief. ?Allergy medicines if your attacks are brought on by allergens. ?Medicines to help control the body's defense (immune) system. ?Follow these instructions at home: ?Avoiding triggers in your home ?Change your heating and air conditioning filter often. ?Limit your use of fireplaces and wood stoves. ?Get rid of pests (such as roaches and mice) and their droppings. ?Throw away plants  if you see mold on them. ?Clean your floors. Dust regularly. Use cleaning products that do not smell. ?Have someone vacuum when you are not home. Use a vacuum cleaner with a HEPA filter if possible. ?Replace carpet with wood, tile, or vinyl flooring. Carpet can trap animal skin flakes and dust. ?Use allergy-proof pillows, mattress covers, and box spring covers. ?Wash bed sheets and blankets every week in hot water. Dry them in a dryer. ?Keep your bedroom free of any triggers. ?Avoid pets and keep windows closed when things that cause allergy symptoms are in the air. ?Use blankets that are made of polyester or cotton. ?Clean bathrooms and kitchens with bleach. If possible, have someone repaint the walls in these rooms with mold-resistant paint. Keep out of the rooms that are being cleaned and painted. ?Wash your hands often with soap and water. If soap and water are not available, use hand sanitizer. ?Do not allow anyone to smoke in your home. ?General instructions ?Take over-the-counter and prescription medicines only as told by your doctor. ?Talk with your doctor if you have questions about how or when to take your medicines. ?Make note if you need to use your medicines more often than usual. ?Do not use any products that contain nicotine or tobacco, such as cigarettes and e-cigarettes. If you need help quitting, ask your doctor. ?Stay away from secondhand smoke. ?Avoid doing things outdoors when allergen counts are high and when air quality is low. ?Wear a ski mask when doing outdoor activities in the winter. The mask should cover your nose and mouth. Exercise indoors on cold days if you can. ?Warm up before you exercise. Take time to cool down after exercise. ?Use a peak flow meter as   told by your doctor. A peak flow meter is a tool that measures how well the lungs are working. ?Keep track of the peak flow meter's readings. Write them down. ?Follow your asthma action plan. This is a written plan for taking care  of your asthma and treating your attacks. ?Make sure you get all the shots (vaccines) that your doctor recommends. Ask your doctor about a flu shot and a pneumonia shot. ?Keep all follow-up visits as told by your doctor. This is important. ?Contact a doctor if: ?You have wheezing, shortness of breath, or a cough even while taking medicine to prevent attacks. ?The mucus you cough up (sputum) is thicker than usual. ?The mucus you cough up changes from clear or white to yellow, green, gray, or bloody. ?You have problems from the medicine you are taking, such as: ?A rash. ?Itching. ?Swelling. ?Trouble breathing. ?You need reliever medicines more than 2-3 times a week. ?Your peak flow reading is still at 50-79% of your personal best after following the action plan for 1 hour. ?You have a fever. ?Get help right away if: ?You seem to be worse and are not responding to medicine during an asthma attack. ?You are short of breath even at rest. ?You get short of breath when doing very little activity. ?You have trouble eating, drinking, or talking. ?You have chest pain or tightness. ?You have a fast heartbeat. ?Your lips or fingernails start to turn blue. ?You are light-headed or dizzy, or you faint. ?Your peak flow is less than 50% of your personal best. ?You feel too tired to breathe normally. ?Summary ?Asthma is a long-term (chronic) condition in which the airways get tight and narrow. An asthma attack can make it hard to breathe. ?Asthma cannot be cured, but medicines and lifestyle changes can help control it. ?Make sure you understand how to avoid triggers and how and when to use your medicines. ?This information is not intended to replace advice given to you by your health care provider. Make sure you discuss any questions you have with your health care provider. ?Document Revised: 05/13/2019 Document Reviewed: 05/23/2019 ?Elsevier Patient Education ? 2022 Elsevier Inc. ? ?

## 2021-04-23 NOTE — Progress Notes (Signed)
?Isaiah Black ? ?Isaiah Black, is a 46 y.o. male ? ?HBZ:[id number] ? ?BOF:[id number] ? ?DOB - Jul 13, 1975 ? ?Chief Complaint  ?Patient presents with  ? Asthma  ? Rash  ?  On legs that is spreading   ?    ? ?Subjective:  ? ?Isaiah Black is a 46 y.o. male here today for a follow up visit. Patient has No headache, No chest pain, No abdominal pain - No Nausea, No new weakness tingling or numbness, No Cough - SOB. ? ?No problems updated. ? ?Allergies  ?Allergen Reactions  ? Cogentin [Benztropine]   ? ? ?Past Medical History:  ?Diagnosis Date  ? Anxiety   ? Disassociation disorder   ? Paranoid schizophrenia ([place])   ? ? ?Current Outpatient Medications on File Prior to Visit  ?Medication Sig Dispense Refill  ? ABILIFY MAINTENA 400 MG PRSY prefilled syringe INJECT EVERY 28 TO 30 DAYS 1 each 10  ? busPIRone (BUSPAR) 15 MG tablet Take 2 tablets (30 mg total) by mouth 2 (two) times daily. 60 tablet 2  ? hydrOXYzine (ATARAX) 50 MG tablet TAKE 1 TABLET(50 MG) BY MOUTH THREE TIMES DAILY AS NEEDED 90 tablet 2  ? mirtazapine (REMERON) 7.5 MG tablet TAKE 1 TABLET(7.5 MG) BY MOUTH AT BEDTIME 30 tablet 2  ? valbenazine (INGREZZA) 40 MG capsule TAKE 1 CAPSULE(40 MG) BY MOUTH DAILY 30 capsule 3  ? ?Current Facility-Administered Medications on File Prior to Visit  ?Medication Dose Route Frequency Provider Last Rate Last Admin  ? ARIPiprazole ER (ABILIFY MAINTENA) 400 MG prefilled syringe 400 mg  400 mg Intramuscular Q28 days Lucky Rathke, FNP   400 mg at 04/14/21 7782  ? ? ?Objective:  ? ?Vitals:  ? 04/23/21 0950  ?BP: 124/81  ?Pulse: 82  ?Temp: 97.8 ?F (36.6 ?C)  ?TempSrc: Oral  ?SpO2: 95%  ?Weight: 159 lb 9.6 oz (72.4 kg)  ? ? ?Exam ?General appearance : Awake, alert, not in any distress. Speech Clear. Not toxic looking ?HEENT: Atraumatic and Normocephalic, pupils equally reactive to light and accomodation ?Neck: Supple, no JVD. No cervical lymphadenopathy.  ?Chest: Good air entry bilaterally, no added sounds  ?CVS: S1 S2  regular, no murmurs.  ?Abdomen: Bowel sounds present, Non tender and not distended with no gaurding, rigidity or rebound. ?Extremities: B/L Lower Ext shows no edema, both legs are warm to touch ?Neurology: Awake alert, and oriented X 3, Non focal ?Skin: No Rash ? ?Data Review ?Lab Results  ?Component Value Date  ? HGBA1C 4.6 (L) 03/07/2019  ? HGBA1C 5.1 07/08/2017  ? ? ?Assessment & Plan  ?Isaiah Black was seen today for asthma and rash. ? ?Diagnoses and all orders for this visit: ? ?Rash and nonspecific skin eruption ? ? ?-     nystatin-triamcinolone ointment (MYCOLOG); Apply 1 application. topically 2 (two) times daily. ? ?Moderate persistent asthma without complication ?Managed on SABA/LABA and singular  ?-     montelukast (SINGULAIR) 10 MG tablet; Take 1 tablet (10 mg total) by mouth at bedtime. ?-     budesonide-formoterol (SYMBICORT) 160-4.5 MCG/ACT inhaler; Inhale 2 puffs into the lungs 2 (two) times daily. ?-     albuterol (VENTOLIN HFA) 108 (90 Base) MCG/ACT inhaler; Inhale 2 puffs into the lungs every 6 (six) hours as needed for wheezing or shortness of breath. ? ?Medication refill ?-     montelukast (SINGULAIR) 10 MG tablet; Take 1 tablet (10 mg total) by mouth at bedtime. ?-     budesonide-formoterol (SYMBICORT) 160-4.5 MCG/ACT inhaler;  Inhale 2 puffs into the lungs 2 (two) times daily. ?-     albuterol (VENTOLIN HFA) 108 (90 Base) MCG/ACT inhaler; Inhale 2 puffs into the lungs every 6 (six) hours as needed for wheezing or shortness of breath. ? ?  ?Patient have been counseled extensively about nutrition and exercise. Other issues discussed during this visit include: low cholesterol diet, weight control and daily exercise, foot care, annual eye examinations at [facility], importance of adherence with medications and regular follow-up. We also discussed long term complications of uncontrolled diabetes and hypertension.  ? ?Return in about 6 months (around 10/24/2021) for asthma . ? ?The patient was given clear  instructions to go to ER or return to medical center if symptoms don't improve, worsen or new problems develop. The patient verbalized understanding. The patient was told to call to get lab results if they haven't heard anything in the next week.  ? ?This note has been created with Surveyor, quantity. Any transcriptional errors are unintentional.  ? ?Kerin Perna, NP ?04/26/2021, 11:16 PM  ?

## 2021-05-12 ENCOUNTER — Ambulatory Visit (INDEPENDENT_AMBULATORY_CARE_PROVIDER_SITE_OTHER): Payer: Medicaid Other | Admitting: Family

## 2021-05-12 ENCOUNTER — Encounter (HOSPITAL_COMMUNITY): Payer: Self-pay | Admitting: Family

## 2021-05-12 ENCOUNTER — Encounter (HOSPITAL_COMMUNITY): Payer: Self-pay

## 2021-05-12 ENCOUNTER — Ambulatory Visit (INDEPENDENT_AMBULATORY_CARE_PROVIDER_SITE_OTHER): Payer: Medicaid Other | Admitting: *Deleted

## 2021-05-12 VITALS — BP 124/79 | HR 84 | Ht 64.0 in | Wt 157.0 lb

## 2021-05-12 DIAGNOSIS — F411 Generalized anxiety disorder: Secondary | ICD-10-CM | POA: Diagnosis not present

## 2021-05-12 DIAGNOSIS — F32A Depression, unspecified: Secondary | ICD-10-CM

## 2021-05-12 DIAGNOSIS — F200 Paranoid schizophrenia: Secondary | ICD-10-CM

## 2021-05-12 DIAGNOSIS — G2401 Drug induced subacute dyskinesia: Secondary | ICD-10-CM | POA: Diagnosis not present

## 2021-05-12 MED ORDER — ABILIFY MAINTENA 400 MG IM PRSY: PREFILLED_SYRINGE | INTRAMUSCULAR | 10 refills | Status: DC

## 2021-05-12 MED ORDER — BUSPIRONE HCL 15 MG PO TABS: 30.0000 mg | ORAL_TABLET | Freq: Two times a day (BID) | ORAL | 3 refills | Status: DC

## 2021-05-12 MED ORDER — HYDROXYZINE HCL 50 MG PO TABS: ORAL_TABLET | ORAL | 3 refills | Status: DC

## 2021-05-12 MED ORDER — VALBENAZINE TOSYLATE 40 MG PO CAPS: ORAL_CAPSULE | ORAL | 3 refills | Status: DC

## 2021-05-12 MED ORDER — MIRTAZAPINE 7.5 MG PO TABS: ORAL_TABLET | ORAL | 3 refills | Status: DC

## 2021-05-12 NOTE — Progress Notes (Signed)
BH MD/PA/NP OP Progress Note ? ?05/12/2021 1:10 PM ?Isaiah Black  ?MRN:  [id number] ? ?Chief Complaint: No chief complaint on file. ? ?HPI:  ?Isaiah Black is a 46 year old male seen today for follow-up psychiatric evaluation. Psychiatric history includes schizophrenia, generalized anxiety disorder and major depressive disorder.  He has been managed on Abilify Maintena LAI. He is followed by outpatient therapy, feels this is therapeutic as well, sees counselor Isaiah Black approx once per month..  ? ?Patient is assessed face-to-face by nurse practitioner, chart reviewed.  His mother, Isaiah Black, remains present during assessment.   Today patient is seated, no acute distress. He is appropriately groomed. He is alert and oriented, pleasant and cooperative. He has clear and coherent speech average volume.  Behavior calm and appropriate, with good eye contact.  Mood today appears euthymic, congruent affect.   ? ?Today provider conducted a PHQ-2, patient scored a 1, at last visit he scored a 3. Provider also conducted a PHQ-9, he scored a 4, at last visit he scored a 14. ? ?He denies suicidal and homicidal ideations currently.  He contracts verbally for safety with this Probation officer.   ? ?  He denies visual hallucinations.  There is no indication that he is responding to internal stimuli, no evidence of delusional thought content.  Patient is able to converse coherently with goal-directed thoughts and no distractibility or preoccupation. He denies paranoia.  Objectively there is no evidence of psychosis/mania or delusional thinking. Patient is insightful regarding treatment and diagnosis.    ? ?Patient reports average sleep and appetite.   He denies physical pain currently.  He denies both alcohol and substance use.  ? ?Recent positive outcomes include acceptance into mental health day program. "I got into [facility] day program, mental health day program." Plans to attend day program two days per week. ? ?Chronic auditory hallucinations  "chatter, like a crowded restaurant" with Abilify it's like it's like its "in another room." Hallucinations lessened and tolerable with Abilify. ? ?Additional positive outcomes include Isaiah Black's daughter, age 44,  in [place], may be able to visit this year.  ? ?Isaiah Black resides in [place], briefly residing with Mother, hopeful that [place] can help with more permanent housing. Section 8 awarded to his mother requires that Isaiah Black does not reside permanently in her home.  ? ?Engineer, manufacturing systems very supportive, ankle monitor will be removed once he moves from mother's home (mother's home too near to school to have ankle monitor removed related to sex offender status.) ? ?Patient provided support and encouragement.   Plan to follow-up in one month for monthly injection. ? ?Visit Diagnosis:  ?  ICD-10-CM   ?1. Paranoid schizophrenia ([place])  F20.0 ARIPiprazole ER (ABILIFY MAINTENA) 400 MG PRSY prefilled syringe  ?  ?2. Generalized anxiety disorder  F41.1 busPIRone (BUSPAR) 15 MG tablet  ?  hydrOXYzine (ATARAX) 50 MG tablet  ?  mirtazapine (REMERON) 7.5 MG tablet  ?  ?3. Mild depression  F32.A busPIRone (BUSPAR) 15 MG tablet  ?  mirtazapine (REMERON) 7.5 MG tablet  ?  ?4. Tardive dyskinesia  G24.01 valbenazine (INGREZZA) 40 MG capsule  ?  ? ? ?Past Psychiatric History: schizophrenia, MDD, GAD ? ?Past Medical History:  ?Past Medical History:  ?Diagnosis Date  ? Anxiety   ? Disassociation disorder   ? Paranoid schizophrenia ([place])   ? No past surgical history on file. ? ?Family Psychiatric History: none reported ? ?Family History: No family history on file. ? ?Social History:  ?Social History  ? ?Socioeconomic History  ?  Marital status: Single  ?  Spouse name: Not on file  ? Number of children: 1  ? Years of education: Not on file  ? Highest education level: Not on file  ?Occupational History  ? Not on file  ?Tobacco Use  ? Smoking status: Former  ? Smokeless tobacco: Never  ?Vaping Use  ? Vaping Use: Former  ?Substance  and Sexual Activity  ? Alcohol use: Not Currently  ?  Comment: occ  ? Drug use: Not Currently  ? Sexual activity: Not Currently  ?Other Topics Concern  ? Not on file  ?Social History Narrative  ? Not on file  ? ?Social Determinants of Health  ? ?Financial Resource Strain: Low Risk   ? Difficulty of Paying Living Expenses: Not hard at all  ?Food Insecurity: No Food Insecurity  ? Worried About Charity fundraiser in the Last Year: Never true  ? Ran Out of Food in the Last Year: Never true  ?Transportation Needs: No Transportation Needs  ? Lack of Transportation (Medical): No  ? Lack of Transportation (Non-Medical): No  ?Physical Activity: Sufficiently Active  ? Days of Exercise per Week: 7 days  ? Minutes of Exercise per Session: 60 min  ?Stress: No Stress Concern Present  ? Feeling of Stress : Only a little  ?Social Connections: Socially Isolated  ? Frequency of Communication with Friends and Family: More than three times a week  ? Frequency of Social Gatherings with Friends and Family: Twice a week  ? Attends Religious Services: Never  ? Active Member of Clubs or Organizations: No  ? Attends Archivist Meetings: Never  ? Marital Status: Never married  ? ? ?Allergies:  ?Allergies  ?Allergen Reactions  ? Cogentin [Benztropine]   ? ? ?Metabolic Disorder Labs: ?Lab Results  ?Component Value Date  ? HGBA1C 4.6 (L) 03/07/2019  ? MPG 85.32 03/07/2019  ? MPG 99.67 07/08/2017  ? ?Lab Results  ?Component Value Date  ? PROLACTIN 100.0 (H) 03/07/2019  ? PROLACTIN 51.1 (H) 07/08/2017  ? ?Lab Results  ?Component Value Date  ? CHOL 132 03/07/2019  ? TRIG 48 03/07/2019  ? HDL 42 03/07/2019  ? CHOLHDL 3.1 03/07/2019  ? VLDL 10 03/07/2019  ? [place] 80 03/07/2019  ? LDLCALC 109 (H) 07/08/2017  ? ?Lab Results  ?Component Value Date  ? TSH 5.024 (H) 03/07/2019  ? TSH 6.137 (H) 07/08/2017  ? ? ?Therapeutic Level Labs: ?No results found for: LITHIUM ?No results found for: VALPROATE ?No components found for:  CBMZ ? ?Current  Medications: ?Current Outpatient Medications  ?Medication Sig Dispense Refill  ? albuterol (VENTOLIN HFA) 108 (90 Base) MCG/ACT inhaler Inhale 2 puffs into the lungs every 6 (six) hours as needed for wheezing or shortness of breath. 18 each 3  ? ARIPiprazole ER (ABILIFY MAINTENA) 400 MG PRSY prefilled syringe INJECT EVERY 28 TO 30 DAYS 1 each 10  ? budesonide-formoterol (SYMBICORT) 160-4.5 MCG/ACT inhaler Inhale 2 puffs into the lungs 2 (two) times daily. 3 each 4  ? busPIRone (BUSPAR) 15 MG tablet Take 2 tablets (30 mg total) by mouth 2 (two) times daily. 60 tablet 3  ? hydrOXYzine (ATARAX) 50 MG tablet TAKE 1 TABLET(50 MG) BY MOUTH THREE TIMES DAILY AS NEEDED 90 tablet 3  ? mirtazapine (REMERON) 7.5 MG tablet TAKE 1 TABLET(7.5 MG) BY MOUTH AT BEDTIME 30 tablet 3  ? montelukast (SINGULAIR) 10 MG tablet Take 1 tablet (10 mg total) by mouth at bedtime. 90 tablet 3  ?  triamcinolone cream (KENALOG) 0.1 % Apply 1 application. topically 2 (two) times daily. 45 g 1  ? valbenazine (INGREZZA) 40 MG capsule TAKE 1 CAPSULE(40 MG) BY MOUTH DAILY 30 capsule 3  ? ?Current Facility-Administered Medications  ?Medication Dose Route Frequency Provider Last Rate Last Admin  ? ARIPiprazole ER (ABILIFY MAINTENA) 400 MG prefilled syringe 400 mg  400 mg Intramuscular Q28 days Lucky Rathke, FNP   400 mg at 05/12/21 1012  ? ? ? ?Musculoskeletal: ?Strength & Muscle Tone: within normal limits ?Gait & Station: normal ?Patient leans: N/A ? ?Psychiatric Specialty Exam: ?Review of Systems  ?Constitutional: Negative.   ?HENT: Negative.    ?Eyes: Negative.   ?Respiratory: Negative.    ?Cardiovascular: Negative.   ?Gastrointestinal: Negative.   ?Endocrine: Negative.   ?Genitourinary: Negative.   ?Musculoskeletal: Negative.   ?Skin: Negative.   ?Allergic/Immunologic: Negative.   ?Neurological: Negative.   ?Hematological: Negative.   ?Psychiatric/Behavioral: Negative.    ?All other systems reviewed and are negative.  ?There were no vitals taken for  this visit.There is no height or weight on file to calculate BMI.  ?General Appearance: Casual and Fairly Groomed  ?Eye Contact:  Good  ?Speech:  Clear and Coherent and Normal Rate  ?Volume:  Normal  ?Mood:  Eu

## 2021-05-12 NOTE — Progress Notes (Signed)
In with mom for his monthly injection of abilify M 400 mg given today in his R DELTOID without issue. Mom brought my coworker and myself a flower she had made out of pipecleaners, she and Letcher are always working on crafts and do a beautiful job. He offers no complaints and is at his baseline. He saw the NP today for his every 3 month assessment with the provider. He is to return in one month for his next shot.  ?

## 2021-05-13 ENCOUNTER — Encounter (HOSPITAL_COMMUNITY): Payer: Self-pay

## 2021-05-13 ENCOUNTER — Ambulatory Visit (INDEPENDENT_AMBULATORY_CARE_PROVIDER_SITE_OTHER): Payer: Medicaid Other | Admitting: Licensed Clinical Social Worker

## 2021-05-13 DIAGNOSIS — F200 Paranoid schizophrenia: Secondary | ICD-10-CM | POA: Diagnosis not present

## 2021-05-13 DIAGNOSIS — F331 Major depressive disorder, recurrent, moderate: Secondary | ICD-10-CM

## 2021-05-13 NOTE — Progress Notes (Signed)
? ?  THERAPIST PROGRESS NOTE ? ?Session Time: 30 ? ?Participation Level: Active ? ?Behavioral Response: CasualAlertAnxious ? ?Type of Therapy: Individual Therapy ? ?Treatment Goals addressed: Decreased PHQ-9 below a 10 and decreased GAD-7 below a 5 ? ?ProgressTowards Goals: Progressing ? ?Interventions: CBT and Supportive ? ?Summary: Isaiah Black is a 46 y.o. male who presents with euthymic mood\affect.  Patient was pleasant, cooperative, maintained good eye contact.  Patient was alert and oriented x5 and dressed casually. ? Patient reports primary stressors as legal.  Patient reports that his niece that charged him with a sexual assault charge is coming back into [place].  Patient reports that he has taken steps in the past to make sure he does not have any contact with her as she has attempted to reach out to other family members to try to get to him.  Patient reports that she has blocked on his Facebook.  Patient reports that he is going to notify his probation officer as well.  Patient states that he is currently staying with his mother and his mother has stated that his niece is not allowed over at the house or she will call the police.  Isaiah Black reports some good news as [facility].  Patient reports that he will be starting the program on Friday.  Patient reports that he is planning on attending at least 2 programs per week.  ? ?Suicidal/Homicidal: Nowithout intent/plan ? ?Therapist Response:  ? ? Intervention/Plan: LCSW administered a GAD-7 patient decrease score from a 10 on April 14, 2021 to a 7 on May 13, 2021.  Patient's goal\objective is below a 5.  LCSW also administered a PHQ-9 patient scored a 13 on April 14, 2021 and today patient is scoring an 29.  Patient's goal\objective is below a 10.  LCSW utilized supportive therapy for praise and encouragement.  LCSW utilized cognitive behavioral therapy for cognitive restructuring and reframing.  LCSW  utilized empowerment for person centered therapy.  LCSW did educate patient on PHQ-9 scores and GAD-7 scores. ? ?Plan: Return again in 4 weeks. ? ?Diagnosis: Paranoid schizophrenia ([place]) ? ?Major depressive disorder, recurrent episode, moderate ([place]) ? ?Collaboration of Care: Other [facility]  ? ?Patient/Guardian was advised Release of Information must be obtained prior to any record release in order to collaborate their care with an outside provider. Patient/Guardian was advised if they have not already done so to contact the registration department to sign all necessary forms in order for [place] to release information regarding their care.  ? ?Consent: Patient/Guardian gives verbal consent for treatment and assignment of benefits for services provided during this visit. Patient/Guardian expressed understanding and agreed to proceed.  ? ?Dory Horn, LCSW ?05/13/2021 ? ?

## 2021-05-13 NOTE — Plan of Care (Signed)
?  Problem: Anxiety Disorder CCP Problem  1 GAD ?Goal:  walk 3 x weekly  ?Outcome: Progressing ?Note: Pt reports walking 3 x weekly  ?Goal: Find independent housing  ?Outcome: Not Progressing ?Note: Pt reports that [place] can help connect pt with housing.  ?Goal: Decrease nightmare to 2/7 days weekly  ?Outcome: Progressing ?Note: Pt reports 2 nightmares per week as of 05/13/21  ?Goal: LTG: Patient will score less than 5 on the Generalized Anxiety Disorder 7 Scale (GAD-7) ?Outcome: Progressing ?Note: Decrease to a 7 on 05/13/21  ?Goal: STG: Patient will complete at least 80% of assigned homework ?Outcome: Progressing ?Goal: STG: Patient will practice problem solving skills 3 times per week for the next 4 weeks ?Outcome: Progressing ?Goal: STG: Report a decrease in anxiety symptoms as evidenced by an overall reduction in anxiety score by a minimum of 25% on the Generalized Anxiety Disorder Scale ?Outcome: Progressing ?Note: Decrease to an 11 on 05/13/21 ?  ?  ?

## 2021-06-02 ENCOUNTER — Ambulatory Visit (INDEPENDENT_AMBULATORY_CARE_PROVIDER_SITE_OTHER): Payer: Medicaid Other | Admitting: Licensed Clinical Social Worker

## 2021-06-02 DIAGNOSIS — F200 Paranoid schizophrenia: Secondary | ICD-10-CM | POA: Diagnosis not present

## 2021-06-02 DIAGNOSIS — F411 Generalized anxiety disorder: Secondary | ICD-10-CM

## 2021-06-02 NOTE — Progress Notes (Addendum)
? ?  THERAPIST PROGRESS NOTE ? ?Session Time: 30 ? ?Participation Level: Active ? ?Behavioral Response: CasualAlertAnxious and Depressed ? ?Type of Therapy: Individual Therapy ? ?Treatment Goals addressed: walk 3 x weekly  ? ?ProgressTowards Goals: Progressing ? ?Interventions: CBT, Motivational Interviewing, and Supportive ? ?Summary: Isaiah Black is a 46 y.o. male who presents with euthymic mood\affect.  Patient was pleasant, cooperative, maintained good eye contact.  He engaged well in therapy session and was dressed casually for in person session.  ? Patient's primary stressor is family conflict.  Patient reports that his biological daughter is out in [place] with her biological mother.  Patient reports that his daughters mother is currently pregnant and [name] has not seen his daughter in multiple years.  [name] reports one of his ideas is to bring his daughter [name] down to [place] for the summer.  Patient reports that he still needs to get this cleared by his probation officer and figure out how he would get her down and back up to [place].  Patient reports that this has increased his anxiety as he really misses [name].  Patient reports some good news as [facility] in the past 2 weeks.  Goal\objective for patient moving forward will be attending [facility] at least 1 time weekly.  Patient reports next visit will be this Thursday. ? ?Suicidal/Homicidal: Nowithout intent/plan ? ?Therapist Response:  ? ? Intervention/Plan: LCSW educated patient on signs and symptoms of schizophrenia and generalized anxiety disorder.  LCSW educated patient on the importance of taking medications on time and as directed.  LCSW administered the GAD-7. LCSW administered a PHQ-9.  Patient is not progressing on PHQ-9 for GAD-7 at this time.  Patient reports increased stressors for family conflict with his biological daughter and trying to see her now that he is starting to  get situated financially.  Plan for patient is to [facility] with [facility] at least 1 time weekly moving forward.  Patient to follow-up with this LCSW in 4 weeks. ? ?Plan: Return again in 4 weeks. ? ?Diagnosis: No diagnosis found. ? ?Collaboration of Care: Other none in today's session  ? ?Patient/Guardian was advised Release of Information must be obtained prior to any record release in order to collaborate their care with an outside provider. Patient/Guardian was advised if they have not already done so to contact the registration department to sign all necessary forms in order for [place] to release information regarding their care.  ? ?Consent: Patient/Guardian gives verbal consent for treatment and assignment of benefits for services provided during this visit. Patient/Guardian expressed understanding and agreed to proceed.  ? ?Dory Horn, LCSW ?06/02/2021 ? ?

## 2021-06-02 NOTE — Plan of Care (Signed)
?  Problem: Anxiety Disorder CCP Problem  1 GAD ?Goal:  walk 3 x weekly  ?Outcome: Progressing ?Goal: Find independent housing  ?Outcome: Not Progressing ?Goal: Decrease nightmare to 2/7 days weekly  ?Outcome: Progressing ?Goal: STG: Patient will complete at least 80% of assigned homework ?Outcome: Progressing ?Goal: STG: Patient will practice problem solving skills 3 times per week for the next 4 weeks ?Outcome: Progressing ?Goal: STG: Report a decrease in anxiety symptoms as evidenced by an overall reduction in anxiety score by a minimum of 25% on the Generalized Anxiety Disorder Scale ?Outcome: Not Progressing ?Intervention: Discuss risks and benefits of medication treatment options for this problem and prescribe as indicated ?Intervention: Encourage patient to take psychotropic medication as prescribed ?Intervention: Review results of GAD-7 with the patient to track progress ?Intervention: Work with patient to track symptoms, triggers and/or skill use through a mood chart, diary card, or journal ?Intervention: Perform psychoeducation regarding anxiety disorders ?Intervention: Work with patient to identify the major components of a recent episode of anxiety: physical symptoms, major thoughts and images, and major behaviors they experienced ?Intervention: Work with patient to identify 3 personal goals for managing their anxiety to work on during current treatment ?  ?

## 2021-06-09 ENCOUNTER — Ambulatory Visit (INDEPENDENT_AMBULATORY_CARE_PROVIDER_SITE_OTHER): Payer: Medicaid Other | Admitting: *Deleted

## 2021-06-09 ENCOUNTER — Other Ambulatory Visit (HOSPITAL_COMMUNITY): Payer: Self-pay | Admitting: Registered Nurse

## 2021-06-09 ENCOUNTER — Encounter (HOSPITAL_COMMUNITY): Payer: Self-pay

## 2021-06-09 VITALS — BP 111/82 | HR 96 | Ht 65.35 in | Wt 157.0 lb

## 2021-06-09 DIAGNOSIS — F200 Paranoid schizophrenia: Secondary | ICD-10-CM

## 2021-06-09 DIAGNOSIS — F32A Depression, unspecified: Secondary | ICD-10-CM

## 2021-06-09 DIAGNOSIS — G2401 Drug induced subacute dyskinesia: Secondary | ICD-10-CM

## 2021-06-09 DIAGNOSIS — F411 Generalized anxiety disorder: Secondary | ICD-10-CM

## 2021-06-09 MED ORDER — BUSPIRONE HCL 15 MG PO TABS: 30.0000 mg | ORAL_TABLET | Freq: Two times a day (BID) | ORAL | 3 refills | Status: DC

## 2021-06-09 MED ORDER — HYDROXYZINE HCL 50 MG PO TABS: ORAL_TABLET | ORAL | 3 refills | Status: DC

## 2021-06-09 MED ORDER — ABILIFY MAINTENA 400 MG IM PRSY: PREFILLED_SYRINGE | INTRAMUSCULAR | 10 refills | Status: DC

## 2021-06-09 MED ORDER — VALBENAZINE TOSYLATE 40 MG PO CAPS: ORAL_CAPSULE | ORAL | 3 refills | Status: DC

## 2021-06-09 MED ORDER — MIRTAZAPINE 7.5 MG PO TABS: ORAL_TABLET | ORAL | 3 refills | Status: DC

## 2021-06-09 NOTE — Progress Notes (Signed)
In as scheduled for his monthly Abilifiy M 400 mg injection. Given today in his L DELTOID without issue. His mom is not with him today, she will meet him here later she is getting her EBT recertified next door at [place]. He showed Probation officer some of his craft projects he has been working on. No complaints offered. He is to have lab work done in the next 30 days and to return for his next shot in 28 days too.Marland Kitchen ?

## 2021-06-15 ENCOUNTER — Telehealth (HOSPITAL_COMMUNITY): Payer: Self-pay | Admitting: *Deleted

## 2021-06-15 ENCOUNTER — Other Ambulatory Visit (HOSPITAL_COMMUNITY): Payer: Self-pay | Admitting: Registered Nurse

## 2021-06-15 DIAGNOSIS — G2401 Drug induced subacute dyskinesia: Secondary | ICD-10-CM

## 2021-06-15 MED ORDER — VALBENAZINE TOSYLATE 40 MG PO CAPS: 40.0000 mg | ORAL_CAPSULE | Freq: Every day | ORAL | 1 refills | Status: DC

## 2021-06-15 NOTE — Telephone Encounter (Signed)
Rx sent refill request: ? valbenazine (INGREZZA) 40 MG capsule ?TAKE 1 CAPSULE(40 MG) BY MOUTH DAILY  ? ?

## 2021-06-16 ENCOUNTER — Other Ambulatory Visit (HOSPITAL_COMMUNITY): Payer: Self-pay | Admitting: Psychiatry

## 2021-06-16 DIAGNOSIS — G2401 Drug induced subacute dyskinesia: Secondary | ICD-10-CM

## 2021-06-18 LAB — CBC WITH DIFFERENTIAL/PLATELET
Basophils Absolute: 0.1 10*3/uL (ref 0.0–0.2)
Basos: 1 %
EOS (ABSOLUTE): 0.3 10*3/uL (ref 0.0–0.4)
Eos: 4 %
Hematocrit: 43.2 % (ref 37.5–51.0)
Hemoglobin: 14.8 g/dL (ref 13.0–17.7)
Immature Grans (Abs): 0 10*3/uL (ref 0.0–0.1)
Immature Granulocytes: 0 %
Lymphocytes Absolute: 0.8 10*3/uL (ref 0.7–3.1)
Lymphs: 11 %
MCH: 30.1 pg (ref 26.6–33.0)
MCHC: 34.3 g/dL (ref 31.5–35.7)
MCV: 88 fL (ref 79–97)
Monocytes Absolute: 0.5 10*3/uL (ref 0.1–0.9)
Monocytes: 7 %
Neutrophils Absolute: 5.6 10*3/uL (ref 1.4–7.0)
Neutrophils: 77 %
Platelets: 245 10*3/uL (ref 150–450)
RBC: 4.91 x10E6/uL (ref 4.14–5.80)
RDW: 12.1 % (ref 11.6–15.4)
WBC: 7.2 10*3/uL (ref 3.4–10.8)

## 2021-06-18 LAB — COMPREHENSIVE METABOLIC PANEL
ALT: 16 IU/L (ref 0–44)
AST: 15 IU/L (ref 0–40)
Albumin/Globulin Ratio: 2.3 — ABNORMAL HIGH (ref 1.2–2.2)
Albumin: 4.2 g/dL (ref 4.0–5.0)
Alkaline Phosphatase: 63 IU/L (ref 44–121)
BUN/Creatinine Ratio: 17 (ref 9–20)
BUN: 21 mg/dL (ref 6–24)
Bilirubin Total: 0.5 mg/dL (ref 0.0–1.2)
CO2: 26 mmol/L (ref 20–29)
Calcium: 8.3 mg/dL — ABNORMAL LOW (ref 8.7–10.2)
Chloride: 104 mmol/L (ref 96–106)
Creatinine, Ser: 1.21 mg/dL (ref 0.76–1.27)
Globulin, Total: 1.8 g/dL (ref 1.5–4.5)
Glucose: 103 mg/dL — ABNORMAL HIGH (ref 70–99)
Potassium: 4 mmol/L (ref 3.5–5.2)
Sodium: 140 mmol/L (ref 134–144)
Total Protein: 6 g/dL (ref 6.0–8.5)
eGFR: 76 mL/min/{1.73_m2} (ref >59)

## 2021-06-18 LAB — LIPID PANEL
Chol/HDL Ratio: 3.7 ratio (ref 0.0–5.0)
Cholesterol, Total: 183 mg/dL (ref 100–199)
HDL: 49 mg/dL (ref >39)
LDL Chol Calc (NIH): 107 mg/dL — ABNORMAL HIGH (ref 0–99)
Triglycerides: 152 mg/dL — ABNORMAL HIGH (ref 0–149)
VLDL Cholesterol Cal: 27 mg/dL (ref 5–40)

## 2021-06-18 LAB — MAGNESIUM: Magnesium: 2.2 mg/dL (ref 1.6–2.3)

## 2021-06-18 LAB — PROLACTIN: Prolactin: 8.6 ng/mL (ref 4.0–15.2)

## 2021-06-18 LAB — TSH: TSH: 6.51 u[IU]/mL — ABNORMAL HIGH (ref 0.450–4.500)

## 2021-06-18 LAB — HEMOGLOBIN A1C
Est. average glucose Bld gHb Est-mCnc: 108 mg/dL
Hgb A1c MFr Bld: 5.4 % (ref 4.8–5.6)

## 2021-06-24 ENCOUNTER — Ambulatory Visit (INDEPENDENT_AMBULATORY_CARE_PROVIDER_SITE_OTHER): Payer: Medicaid Other | Admitting: Licensed Clinical Social Worker

## 2021-06-24 DIAGNOSIS — F200 Paranoid schizophrenia: Secondary | ICD-10-CM | POA: Diagnosis not present

## 2021-06-24 NOTE — Plan of Care (Signed)
  Problem: Anxiety Disorder CCP Problem  1 GAD Goal:  walk 3 x weekly  Outcome: Progressing Note: Pt reports walk 3 to 4 x weekly  Goal: Decrease nightmare to 2/7 days weekly  Outcome: Progressing Note: 1 or 2 nights weekly self reported  Goal: LTG: Patient will score less than 5 on the Generalized Anxiety Disorder 7 Scale (GAD-7) Outcome: Progressing Goal: STG: Patient will complete at least 80% of assigned homework Outcome: Progressing Goal: STG: Patient will practice problem solving skills 3 times per week for the next 4 weeks Outcome: Progressing Goal: STG: Report a decrease in anxiety symptoms as evidenced by an overall reduction in anxiety score by a minimum of 25% on the Generalized Anxiety Disorder Scale Outcome: Progressing   Problem: Anxiety Disorder CCP Problem  1 GAD Goal: Find independent housing  Outcome: Not Progressing Note: No housing has been found, pt reports primary barrier has being a registered sex offender    Problem: Anxiety Disorder CCP Problem  1 GAD Intervention: Provide patient with educational information and reading material on anxiety, its causes, and symptoms

## 2021-06-24 NOTE — Progress Notes (Signed)
   THERAPIST PROGRESS NOTE  Session Time: 30  Participation Level: Active  Behavioral Response: CasualAlertAnxious and Depressed  Type of Therapy: Individual Therapy  Treatment Goals addressed: decrease GAD-7 below 5   ProgressTowards Goals: Progressing  Interventions: CBT and Motivational Interviewing  Summary: Isaiah Black is a 46 y.o. male who presents with depressed and anxious mood\affect.  Patient was pleasant, cooperative, maintained good eye contact.  He engaged well in therapy session and was dressed casually.  Patient was alert and oriented x5.  Patient reports primary stressors as housing, family conflict, and legal.  Patient reports that he was attempting to get his daughter out to [place] from [place], but is unable to afford it at this time.  Patient reports that he was thinking about renting a car to get here for the summer with her mom's permission.  Patient reports that neither the mom or him have the money to do this this summer.  Isaiah Black reports goal of getting his daughter next summer while attempting to save up for a vehicle.  [facility].  This is for social skill building groups.  Patient reports that he has been compliant with his probation officer and is down to 18 months left the probation.  Suicidal/Homicidal: Nowithout intent/plan  Therapist Response:     Intervention/Plan: LCSW utilized CBT therapy for education on GAD-7 and PHQ-9.  LCSW notes that patient did not make progress on PHQ-9 but did make progress on GAD-7 which were both administered in today's session.  LCSW educated on increased social interactions will help decrease depression and anxiety.  Patient reports decrease in nightmares from 5 times weekly to 1-2 times weekly.  LCSW utilized language for supportive therapy for praise, encouragement, and empowerment  Plan: Return again in 4 weeks.  Diagnosis: No  diagnosis found.  Collaboration of Care: Other NONE TODAY  Patient/Guardian was advised Release of Information must be obtained prior to any record release in order to collaborate their care with an outside provider. Patient/Guardian was advised if they have not already done so to contact the registration department to sign all necessary forms in order for [place] to release information regarding their care.   Consent: Patient/Guardian gives verbal consent for treatment and assignment of benefits for services provided during this visit. Patient/Guardian expressed understanding and agreed to proceed.   Dory Horn, LCSW 06/24/2021

## 2021-07-02 ENCOUNTER — Other Ambulatory Visit (HOSPITAL_COMMUNITY): Payer: Self-pay | Admitting: Registered Nurse

## 2021-07-02 DIAGNOSIS — G2401 Drug induced subacute dyskinesia: Secondary | ICD-10-CM

## 2021-07-02 MED ORDER — VALBENAZINE TOSYLATE 40 MG PO CAPS: 40.0000 mg | ORAL_CAPSULE | Freq: Every day | ORAL | 2 refills | Status: DC

## 2021-07-09 ENCOUNTER — Ambulatory Visit (INDEPENDENT_AMBULATORY_CARE_PROVIDER_SITE_OTHER): Payer: Medicaid Other | Admitting: *Deleted

## 2021-07-09 ENCOUNTER — Encounter (HOSPITAL_COMMUNITY): Payer: Self-pay

## 2021-07-09 VITALS — BP 126/84 | HR 92 | Ht 65.0 in | Wt 160.0 lb

## 2021-07-09 DIAGNOSIS — F200 Paranoid schizophrenia: Secondary | ICD-10-CM | POA: Diagnosis not present

## 2021-07-09 NOTE — Progress Notes (Signed)
In as scheduled for his monthly injection of Abilify M 400 mg. He has a flat affect this am and offers little even when asked questions but denies any issues. He has several flat red spots on his face, states its eczema and he gets more frequent breakouts in the summer of it. Otherwise, he states he is doing well. Mom did not accompany him this am as she had to take her granddaughter to her last day of school today. He got his shot today in his R DELTOID without issues. He is to return for his next shot in one month.

## 2021-07-16 ENCOUNTER — Telehealth (HOSPITAL_COMMUNITY): Payer: Self-pay | Admitting: *Deleted

## 2021-07-16 NOTE — Telephone Encounter (Signed)
Submitted PA for patients Ingrezza to [facility].

## 2021-07-17 ENCOUNTER — Telehealth (HOSPITAL_COMMUNITY): Payer: Self-pay | Admitting: *Deleted

## 2021-07-17 NOTE — Telephone Encounter (Signed)
PA approved for patients Ingrezza 40 mg, PA# [id number], its effective till 07/11/22. Called [facility] but he called yesterday to have it moved to [facility] but unsure which one, Called him, no answer.[facility] [address] moved it to [facility] [place]. Called them but long wait I didn't wait on. Will fax them the PA was done.

## 2021-07-28 ENCOUNTER — Ambulatory Visit (INDEPENDENT_AMBULATORY_CARE_PROVIDER_SITE_OTHER): Payer: Medicaid Other | Admitting: Licensed Clinical Social Worker

## 2021-07-28 DIAGNOSIS — F200 Paranoid schizophrenia: Secondary | ICD-10-CM

## 2021-07-28 DIAGNOSIS — F333 Major depressive disorder, recurrent, severe with psychotic symptoms: Secondary | ICD-10-CM

## 2021-08-06 ENCOUNTER — Ambulatory Visit (HOSPITAL_COMMUNITY): Payer: Medicaid Other | Admitting: *Deleted

## 2021-08-06 ENCOUNTER — Other Ambulatory Visit (INDEPENDENT_AMBULATORY_CARE_PROVIDER_SITE_OTHER): Payer: Self-pay

## 2021-08-06 ENCOUNTER — Ambulatory Visit (INDEPENDENT_AMBULATORY_CARE_PROVIDER_SITE_OTHER): Payer: Medicaid Other | Admitting: Registered Nurse

## 2021-08-06 ENCOUNTER — Encounter (HOSPITAL_COMMUNITY): Payer: Self-pay

## 2021-08-06 ENCOUNTER — Encounter (HOSPITAL_COMMUNITY): Payer: Self-pay | Admitting: Registered Nurse

## 2021-08-06 VITALS — BP 107/71 | HR 90 | Ht 65.0 in | Wt 159.0 lb

## 2021-08-06 DIAGNOSIS — J4540 Moderate persistent asthma, uncomplicated: Secondary | ICD-10-CM

## 2021-08-06 DIAGNOSIS — G2401 Drug induced subacute dyskinesia: Secondary | ICD-10-CM | POA: Diagnosis not present

## 2021-08-06 DIAGNOSIS — F411 Generalized anxiety disorder: Secondary | ICD-10-CM | POA: Diagnosis not present

## 2021-08-06 DIAGNOSIS — F200 Paranoid schizophrenia: Secondary | ICD-10-CM | POA: Diagnosis not present

## 2021-08-06 DIAGNOSIS — Z760 Encounter for issue of repeat prescription: Secondary | ICD-10-CM

## 2021-08-06 DIAGNOSIS — F32A Depression, unspecified: Secondary | ICD-10-CM | POA: Diagnosis not present

## 2021-08-06 MED ORDER — VALBENAZINE TOSYLATE 40 MG PO CAPS: 40.0000 mg | ORAL_CAPSULE | Freq: Every day | ORAL | 2 refills | Status: DC

## 2021-08-06 MED ORDER — ALBUTEROL SULFATE HFA 108 (90 BASE) MCG/ACT IN AERS: 2.0000 | INHALATION_SPRAY | Freq: Four times a day (QID) | RESPIRATORY_TRACT | 3 refills | Status: DC | PRN

## 2021-08-06 MED ORDER — HYDROXYZINE HCL 50 MG PO TABS: ORAL_TABLET | ORAL | 3 refills | Status: DC

## 2021-08-06 MED ORDER — BUSPIRONE HCL 15 MG PO TABS: 30.0000 mg | ORAL_TABLET | Freq: Two times a day (BID) | ORAL | 3 refills | Status: DC

## 2021-08-06 MED ORDER — ABILIFY MAINTENA 400 MG IM PRSY: PREFILLED_SYRINGE | INTRAMUSCULAR | 10 refills | Status: DC

## 2021-08-06 MED ORDER — MIRTAZAPINE 7.5 MG PO TABS: ORAL_TABLET | ORAL | 3 refills | Status: DC

## 2021-08-06 NOTE — Progress Notes (Signed)
BH MD/PA/NP OP Progress Note  08/06/2021 10:42 AM Isaiah Black  MRN:  [id number]  Chief Complaint:  Chief Complaint  Patient presents with   Follow up psychiatric evaluation and administration of long   HPI: Isaiah Black 46 y.o. male seen face to face in office today for follow-up psychiatric evaluation and administration of long acting injectable.  His psychiatric history includes schizophrenia, generalized anxiety disorder, mild depression, and tardive dyskinesia.  His mental health is managed with Abilify Maintena, Ingrezza 40, Buspar 15 mg, Vistaril 50 mg, and Remeron 7.5 mg.  He reports medication continues to manage his mental health well without adverse reaction.  However he reports that the Julio Alm is making him sleepy.  "I just feel tired and not getting out of bed until 11 am or 12 pm.  Mother is with patient and states if he has something to do he is able to get up with no problem "Its just the days that he doesn't have any appointment or doesn't have to go anywhere."  Patient reports he has been taking the Ingrezza close to a year but the not getting out of bed has only been occurring for the last 1-2 months.  Informed if the Julio Alm was the cause of the sleepiness it wouldn't have just occurred after taking the medication for so long.  Patient admits that on days that he has nothing to do he likes to sleep late but states he will increase his days at the [facility] to 2 or 3 times a week so that he will have something to do.  He denies depression, anxiety, fluctuations in mood, abnormal movement, suicidal/self-harm/homicidal ideation, psychosis, and paranoia.  AIMS, PHQ 2 & 9, C-SSRS, and GAD 7 screenings conducted by provider see scores below.  He reports eating and sleeping without difficulty.    Visit Diagnosis: No diagnosis found.  Past Psychiatric History: schizophrenia, generalized anxiety disorder, mild depression, and tardive dyskinesia.  Past Medical History:  Past  Medical History:  Diagnosis Date   Anxiety    Disassociation disorder    Paranoid schizophrenia ([place])    No past surgical history on file.  Family Psychiatric History: None reported  Family History: No family history on file.  Social History:  Social History   Socioeconomic History   Marital status: Single    Spouse name: Not on file   Number of children: 1   Years of education: Not on file   Highest education level: Not on file  Occupational History   Not on file  Tobacco Use   Smoking status: Former   Smokeless tobacco: Never  Vaping Use   Vaping Use: Former  Substance and Sexual Activity   Alcohol use: Not Currently    Comment: occ   Drug use: Not Currently   Sexual activity: Not Currently  Other Topics Concern   Not on file  Social History Narrative   Not on file   Social Determinants of Health   Financial Resource Strain: Low Risk  (08/06/2020)   Overall Financial Resource Strain (CARDIA)    Difficulty of Paying Living Expenses: Not hard at all  Food Insecurity: No Food Insecurity (04/14/2021)   Hunger Vital Sign    Worried About Running Out of Food in the Last Year: Never true    [place] in the Last Year: Never true  Transportation Needs: No Transportation Needs (04/14/2021)   PRAPARE - Hydrologist (Medical): No    Lack of Transportation (  Non-Medical): No  Physical Activity: Sufficiently Active (09/03/2020)   Exercise Vital Sign    Days of Exercise per Week: 7 days    Minutes of Exercise per Session: 60 min  Recent Concern: Physical Activity - Insufficiently Active (08/06/2020)   Exercise Vital Sign    Days of Exercise per Week: 2 days    Minutes of Exercise per Session: 40 min  Stress: No Stress Concern Present (08/06/2020)   [place]    Feeling of Stress : Only a little  Social Connections: Socially Isolated (11/05/2020)   Social Connection and Isolation  Panel [NHANES]    Frequency of Communication with Friends and Family: More than three times a week    Frequency of Social Gatherings with Friends and Family: Twice a week    Attends Religious Services: Never    Marine scientist or Organizations: No    Attends Archivist Meetings: Never    Marital Status: Never married    Allergies:  Allergies  Allergen Reactions   Cogentin [Benztropine]     Metabolic Disorder Labs: Lab Results  Component Value Date   HGBA1C 5.4 06/17/2021   MPG 85.32 03/07/2019   MPG 99.67 07/08/2017   Lab Results  Component Value Date   PROLACTIN 8.6 06/17/2021   PROLACTIN 100.0 (H) 03/07/2019   Lab Results  Component Value Date   CHOL 183 06/17/2021   TRIG 152 (H) 06/17/2021   HDL 49 06/17/2021   CHOLHDL 3.7 06/17/2021   VLDL 10 03/07/2019   LDLCALC 107 (H) 06/17/2021   LDLCALC 80 03/07/2019   Lab Results  Component Value Date   TSH 6.510 (H) 06/17/2021   TSH 5.024 (H) 03/07/2019    Therapeutic Level Labs: No results found for: "LITHIUM" No results found for: "VALPROATE" No results found for: "CBMZ"  Current Medications: Current Outpatient Medications  Medication Sig Dispense Refill   albuterol (VENTOLIN HFA) 108 (90 Base) MCG/ACT inhaler Inhale 2 puffs into the lungs every 6 (six) hours as needed for wheezing or shortness of breath. 18 each 3   ARIPiprazole ER (ABILIFY MAINTENA) 400 MG PRSY prefilled syringe INJECT EVERY 28 TO 30 DAYS 1 each 10   budesonide-formoterol (SYMBICORT) 160-4.5 MCG/ACT inhaler Inhale 2 puffs into the lungs 2 (two) times daily. 3 each 4   busPIRone (BUSPAR) 15 MG tablet Take 2 tablets (30 mg total) by mouth 2 (two) times daily. 60 tablet 3   hydrOXYzine (ATARAX) 50 MG tablet TAKE 1 TABLET(50 MG) BY MOUTH THREE TIMES DAILY AS NEEDED 90 tablet 3   mirtazapine (REMERON) 7.5 MG tablet TAKE 1 TABLET(7.5 MG) BY MOUTH AT BEDTIME 30 tablet 3   montelukast (SINGULAIR) 10 MG tablet Take 1 tablet (10 mg total)  by mouth at bedtime. 90 tablet 3   triamcinolone cream (KENALOG) 0.1 % Apply 1 application. topically 2 (two) times daily. 45 g 1   valbenazine (INGREZZA) 40 MG capsule Take 1 capsule (40 mg total) by mouth daily. 30 capsule 2   Current Facility-Administered Medications  Medication Dose Route Frequency Provider Last Rate Last Admin   ARIPiprazole ER (ABILIFY MAINTENA) 400 MG prefilled syringe 400 mg  400 mg Intramuscular Q28 days Lucky Rathke, FNP   400 mg at 08/06/21 [id number]     Musculoskeletal: Strength & Muscle Tone: within normal limits Gait & Station: normal Patient leans: N/A  Psychiatric Specialty Exam: Review of Systems  Constitutional: Negative.   HENT: Negative.  Eyes: Negative.   Respiratory: Negative.    Cardiovascular: Negative.   Gastrointestinal: Negative.   Genitourinary: Negative.   Musculoskeletal: Negative.   Skin: Negative.   Neurological: Negative.   Hematological: Negative.   Psychiatric/Behavioral:  Negative for agitation, behavioral problems, dysphoric mood, hallucinations, self-injury, sleep disturbance and suicidal ideas. The patient is not nervous/anxious.     There were no vitals taken for this visit.There is no height or weight on file to calculate BMI.  General Appearance: Casual  Eye Contact:  Good  Speech:  Clear and Coherent and Normal Rate  Volume:  Normal  Mood:  Euthymic  Affect:  Appropriate and Congruent  Thought Process:  Coherent, Goal Directed, and Descriptions of Associations: Intact  Orientation:  Full (Time, Place, and Person)  Thought Content: WDL and Logical   Suicidal Thoughts:  No  Homicidal Thoughts:  No  Memory:  Immediate;   Good Recent;   Good Remote;   Good  Judgement:  Intact  Insight:  Present  Psychomotor Activity:  Normal  Concentration:  Concentration: Good and Attention Span: Good  Recall:  Good  Fund of Knowledge: Good  Language: Good  Akathisia:  No  Handed:  Right  AIMS (if indicated): done  Assets:   Communication Skills Desire for Improvement Financial Resources/Insurance Housing Leisure Time Physical Health Resilience Social Support  ADL's:  Intact  Cognition: WNL  Sleep:  Good   Screenings: [place] Office Visit from 08/06/2021 in [facility] Office Visit from 05/12/2021 in [place] from 10/31/2019 in [facility] Admission (Discharged) from 07/06/2017 in [place] [facility]  AIMS Total Score 4 0 5 0      AUDIT    Flowsheet Row Admission (Discharged) from 03/06/2019 in [place] Admission (Discharged) from 07/06/2017 in [place] [facility]  Alcohol Use Disorder Identification Test Final Score (AUDIT) 25 2      CAGE-AID    Flowsheet Row Counselor from 07/03/2019 in [place] Score 0      GAD-7    [place] Office Visit from 08/06/2021 in [facility] Counselor from 06/24/2021 in [facility] Counselor from 06/02/2021 in [facility] Counselor from 05/13/2021 in [facility] Office Visit from 04/23/2021 in [place]  Total GAD-7 Score 0 '10 12 7 16      '$ PHQ2-9    [place] Office Visit from 08/06/2021 in [facility] Counselor from 06/24/2021 in [facility] Counselor from 06/02/2021 in [facility] Counselor from 05/13/2021 in [facility] Office Visit from 05/12/2021 in [facility]  PHQ-2 Total Score 0 '4 3 2 1  '$ PHQ-9 Total Score 0 '13 13 11 4      '$ [place] Office Visit from 08/06/2021 in [facility] Office Visit from 01/20/2021 in [place] from 10/21/2020 in [place] No Risk No Risk No Risk      Assessment and Plan: Isaiah Black appears to be doing well.  He reports that medications are effective and managing his mental health without any adverse reaction.  Initially felt that the Ingrezza was causing him to sleep late but  realized that sleeping late only occurred on days that he had no plans.  During visit he is dressed appropriate for weather.  He is sitting upright in chair with no noted distress.  He is alert/oriented x 4, calm/cooperative and mood is congruent with affect.  He spoke in a clear tone at moderate volume, and normal pace, with good eye contact.  His thought process is coherent and relevant; and there is no indication that he is currently responding to internal/external stimuli, or experiencing delusional thought content.  He denies depression, anxiety, suicidal/self-harm/homicidal ideation, psychosis, paranoia, and fluctuations in mood.    1. Generalized anxiety disorder - mirtazapine (REMERON) 7.5 MG tablet; TAKE 1 TABLET(7.5 MG) BY MOUTH AT BEDTIME  Dispense: 30 tablet; Refill: 3 - hydrOXYzine (ATARAX) 50 MG tablet; TAKE 1 TABLET(50 MG) BY MOUTH THREE TIMES DAILY AS NEEDED  Dispense: 90 tablet; Refill: 3 - busPIRone (BUSPAR) 15 MG tablet; Take 2 tablets (30 mg total) by mouth 2 (two) times daily.  Dispense: 60 tablet; Refill: 3  2. Mild depression - mirtazapine (REMERON) 7.5 MG tablet; TAKE 1 TABLET(7.5 MG) BY MOUTH AT BEDTIME  Dispense: 30 tablet; Refill: 3 - busPIRone (BUSPAR) 15 MG tablet; Take 2 tablets (30 mg total) by mouth 2 (two) times daily.  Dispense: 60 tablet; Refill: 3  3. Paranoid schizophrenia ([place]) - ARIPiprazole ER (ABILIFY MAINTENA) 400 MG PRSY prefilled syringe; INJECT EVERY 28 TO 30 DAYS  Dispense: 1 each; Refill: 10  4. Tardive dyskinesia - valbenazine (INGREZZA) 40 MG capsule; Take 1  capsule (40 mg total) by mouth daily.  Dispense: 30 capsule; Refill: 2    Collaboration of Care: Collaboration of Care: Medication Management AEB Administration of long acting injectable Abilify Maintena  Patient/Guardian was advised Release of Information must be obtained prior to any record release in order to collaborate their care with an outside provider. Patient/Guardian was advised if they have not already done so to contact the registration department to sign all necessary forms in order for [place] to release information regarding their care.   Consent: Patient/Guardian gives verbal consent for treatment and assignment of benefits for services provided during this visit. Patient/Guardian expressed understanding and agreed to proceed.    Lajuan Kovaleski, NP 08/06/2021, 10:42 AM

## 2021-08-06 NOTE — Progress Notes (Signed)
Patient arrived with Mom for injection:  ARIPiprazole ER (ABILIFY MAINTENA) 400 MG   Patient Pleasant as Always .  Complaint about medication INGREZZA causing him to sleep way to much -- Mom states he will fall asleep  @ 11 pm & sleep until 2 pm.  NO SI/HI NOR AH/VH

## 2021-08-06 NOTE — Progress Notes (Signed)
Reports that the Isaiah Black is working well.  "At first I couldn't even hold a fork to feed myself because my hands was shacking so bad.  I'm doing much better."

## 2021-08-17 IMAGING — CT CT CERVICAL SPINE W/O CM
3 of 4 series · 13 of 33 positions shown, 16 images · non-contrast
Comparison: None.

CLINICAL DATA: Delirium.  Neck trauma.  Syncope with collapse.

EXAM:
CT HEAD WITHOUT CONTRAST
CT CERVICAL SPINE WITHOUT CONTRAST
TECHNIQUE: Multidetector CT imaging of the head and cervical spine was
performed following the standard protocol without intravenous
contrast. Multiplanar CT image reconstructions of the cervical spine
were also generated.

[Series 4: c_spine 2.0 st · axial · 0.26mm/px · z∈[-302,-182]mm · 5 of 90 slices shown, 7 images]
[im 15/90  soft-tissue]
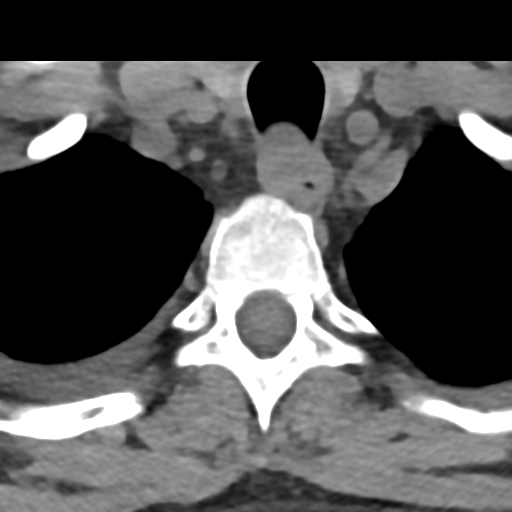
[im 15/90  bone]
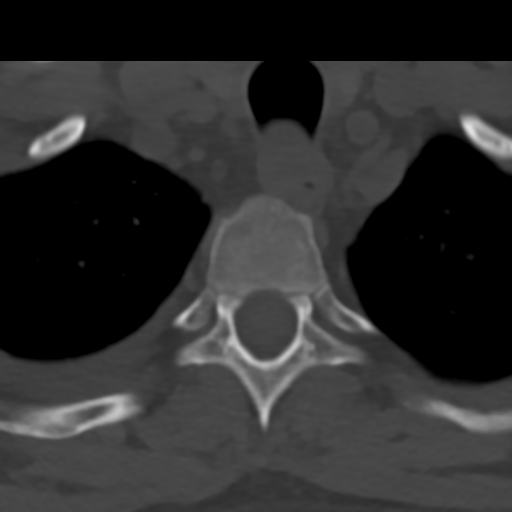
[im 30/90  bone]
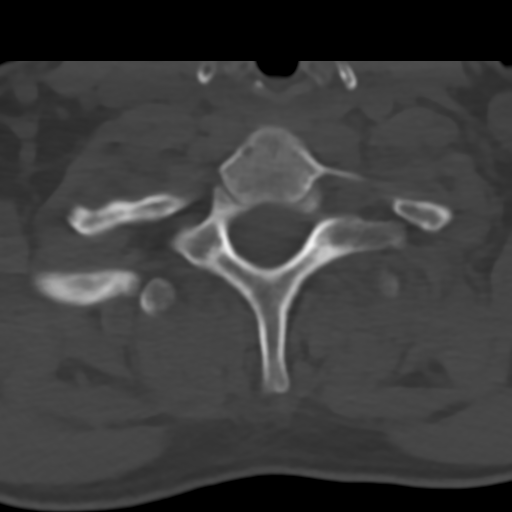
[im 45/90  bone]
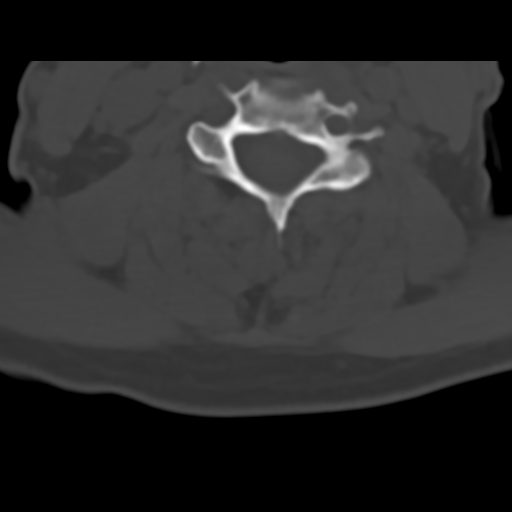
[im 60/90  bone]
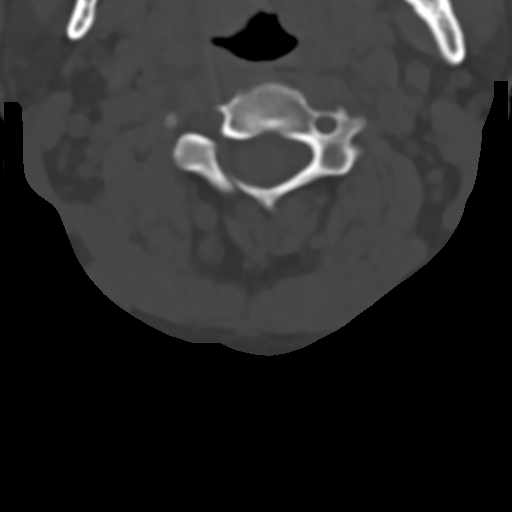
[im 75/90  soft-tissue]
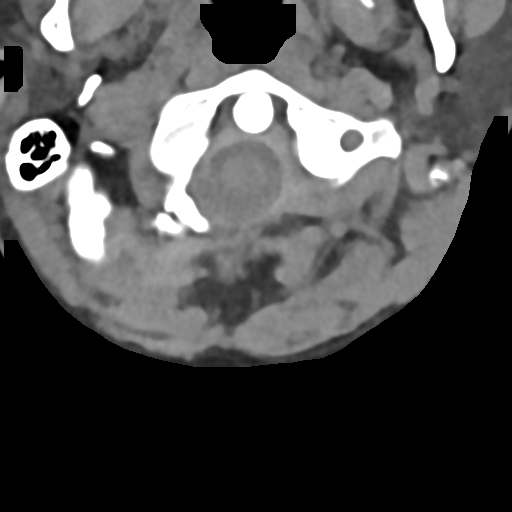
[im 75/90  bone]
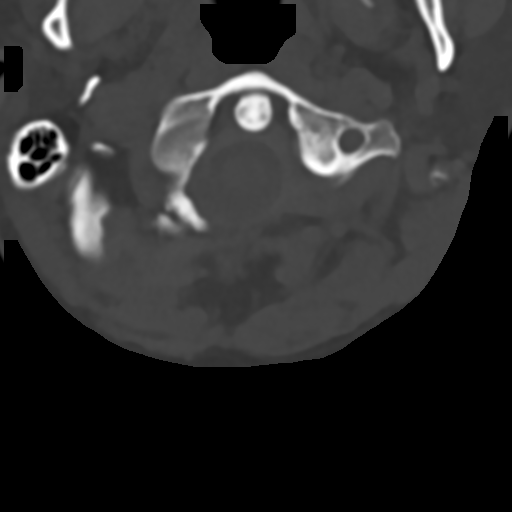

[Series 8: c_spine 2.0 sag bone · sagittal · 0.26mm/px · 5 of 61 slices shown, 6 images]
[im 21/61  bone]
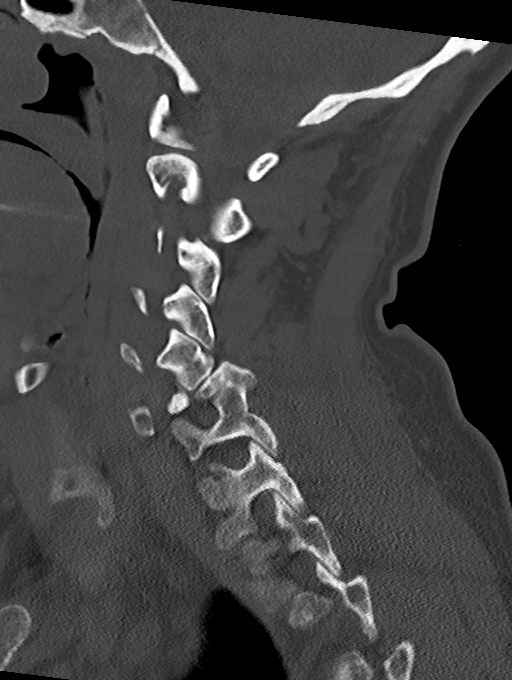
[im 26/61  bone]
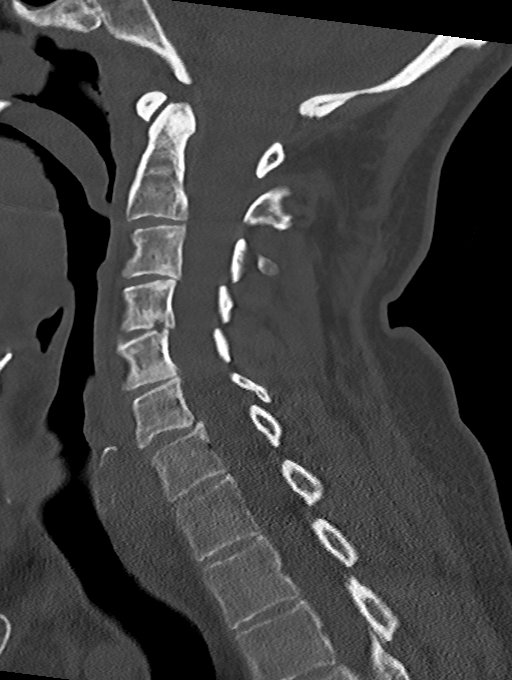
[im 31/61  soft-tissue]
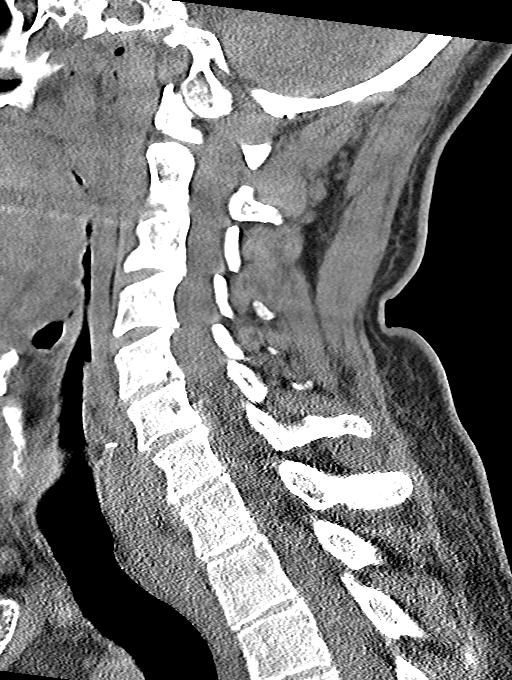
[im 31/61  bone]
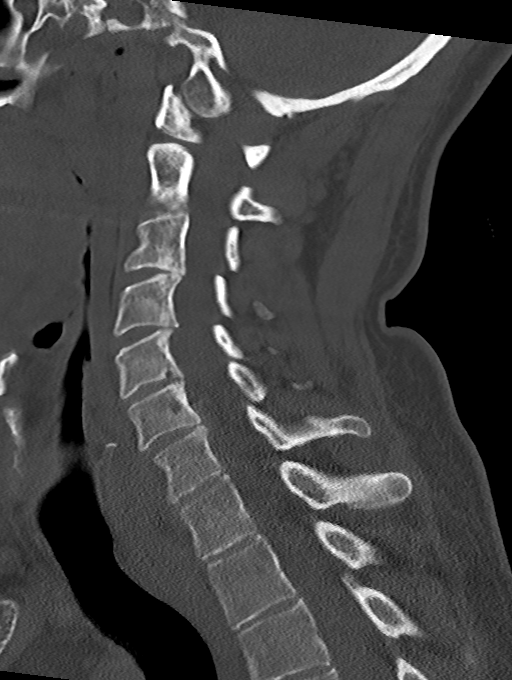
[im 36/61  bone]
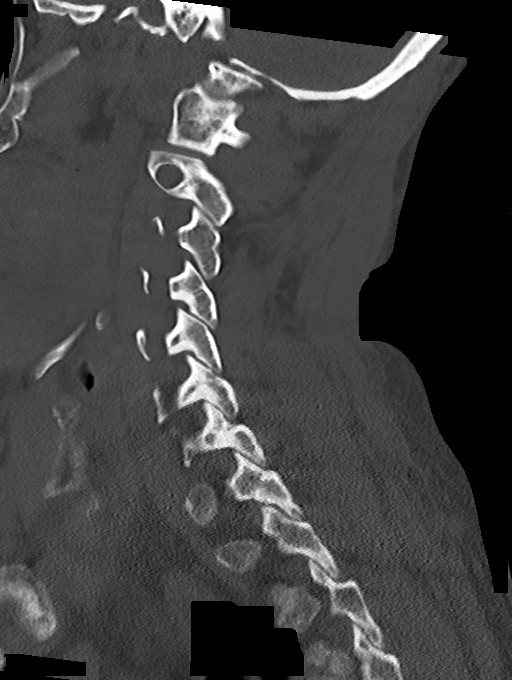
[im 41/61  bone]
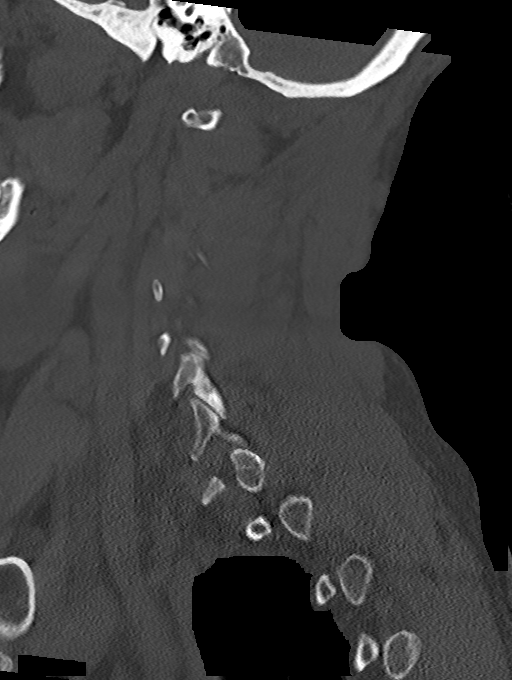

[Series 9: c_spine 2.0 cor bone · coronal · 0.26mm/px · 3 of 61 slices shown]
[im 13/61  bone]
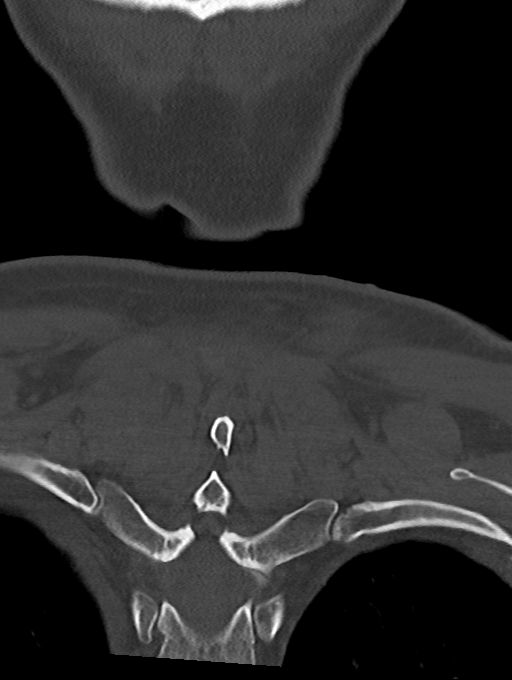
[im 25/61  bone]
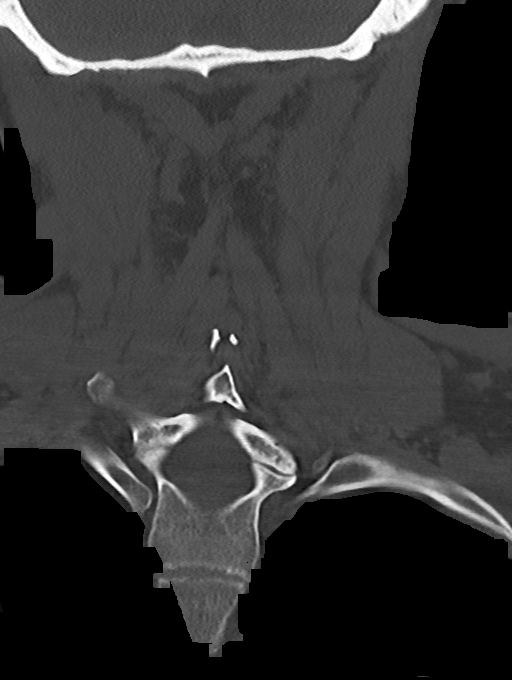
[im 37/61  bone]
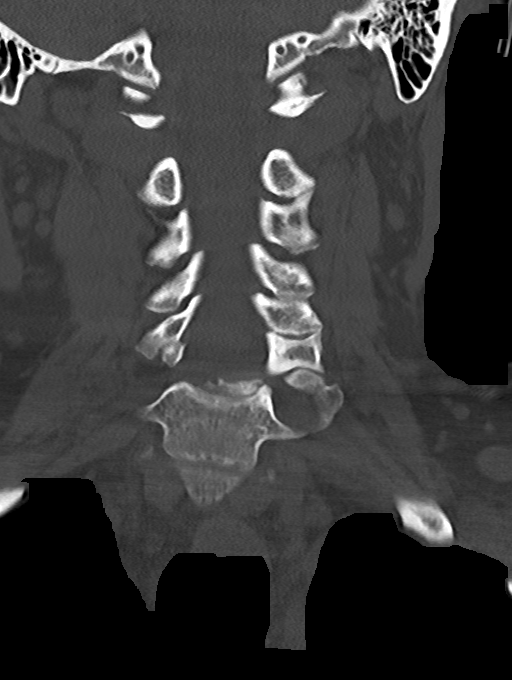

[13 of 33 positions shown; findings below may reference images not displayed]

FINDINGS: CT HEAD FINDINGS

Brain: No evidence of acute large vascular territory infarction,
hemorrhage, hydrocephalus, supratentorail extra-axial collection or
mass lesion/mass effect. Prominent retro-cerebellar CSF density,
likely related to mega cisterna magna or arachnoid cyst. No
substantial mass effect. The fourth ventricle is widely patent.

Vascular: No hyperdense vessel or unexpected calcification.

Skull: No acute fracture.

Sinuses/Orbits: Opacification of scattered ethmoid air cells with
mucosal thickening. Frothy secretions in the right sphenoid sinus
and retention cyst in the left sphenoid sinus. Mucosal thickening of
the partially imaged maxillary sinuses.

Other: No mastoid effusions.

CT CERVICAL SPINE FINDINGS

Alignment: Levocurvature. Trace retrolisthesis of C3 on C4, favor
degenerative.

Skull base and vertebrae: No acute fracture. Vertebral body heights
are maintained. Abnormal right C5-C6 and C6-C7 facet joints with
incomplete fusion of the posterior elements at C5, paddle with
congenital/developmental segmentation anomaly.

Soft tissues and spinal canal: No prevertebral fluid or swelling. No
visible canal hematoma.

Disc levels:  Mild multilevel degenerative disc disease.

Upper chest: Further evaluated on concurrent CT of the chest.
IMPRESSION: CT head:

1. No evidence of acute intracranial abnormality.
2. Paranasal sinus disease, which is nonspecific but can be seen
with sinusitis.

CT cervical spine:

1. No evidence of acute fracture or traumatic malalignment.
2. Abnormal right C5-C6 and C6-C7 facet joints with incomplete
fusion of the posterior elements at C5, compatible with
congenital/developmental segmentation anomaly. Resulting lower
cervical levocurvature.

## 2021-08-17 IMAGING — DX DG CHEST 1V PORT
1 series · 1 of 1 positions shown · non-contrast
Comparison: 05/10/2017.

CLINICAL DATA: Post CPR. Shortness of breath followed by
unresponsiveness after taking normal dose of Invega Sustenna.

EXAM:
PORTABLE CHEST 1 VIEW

[chest ap]
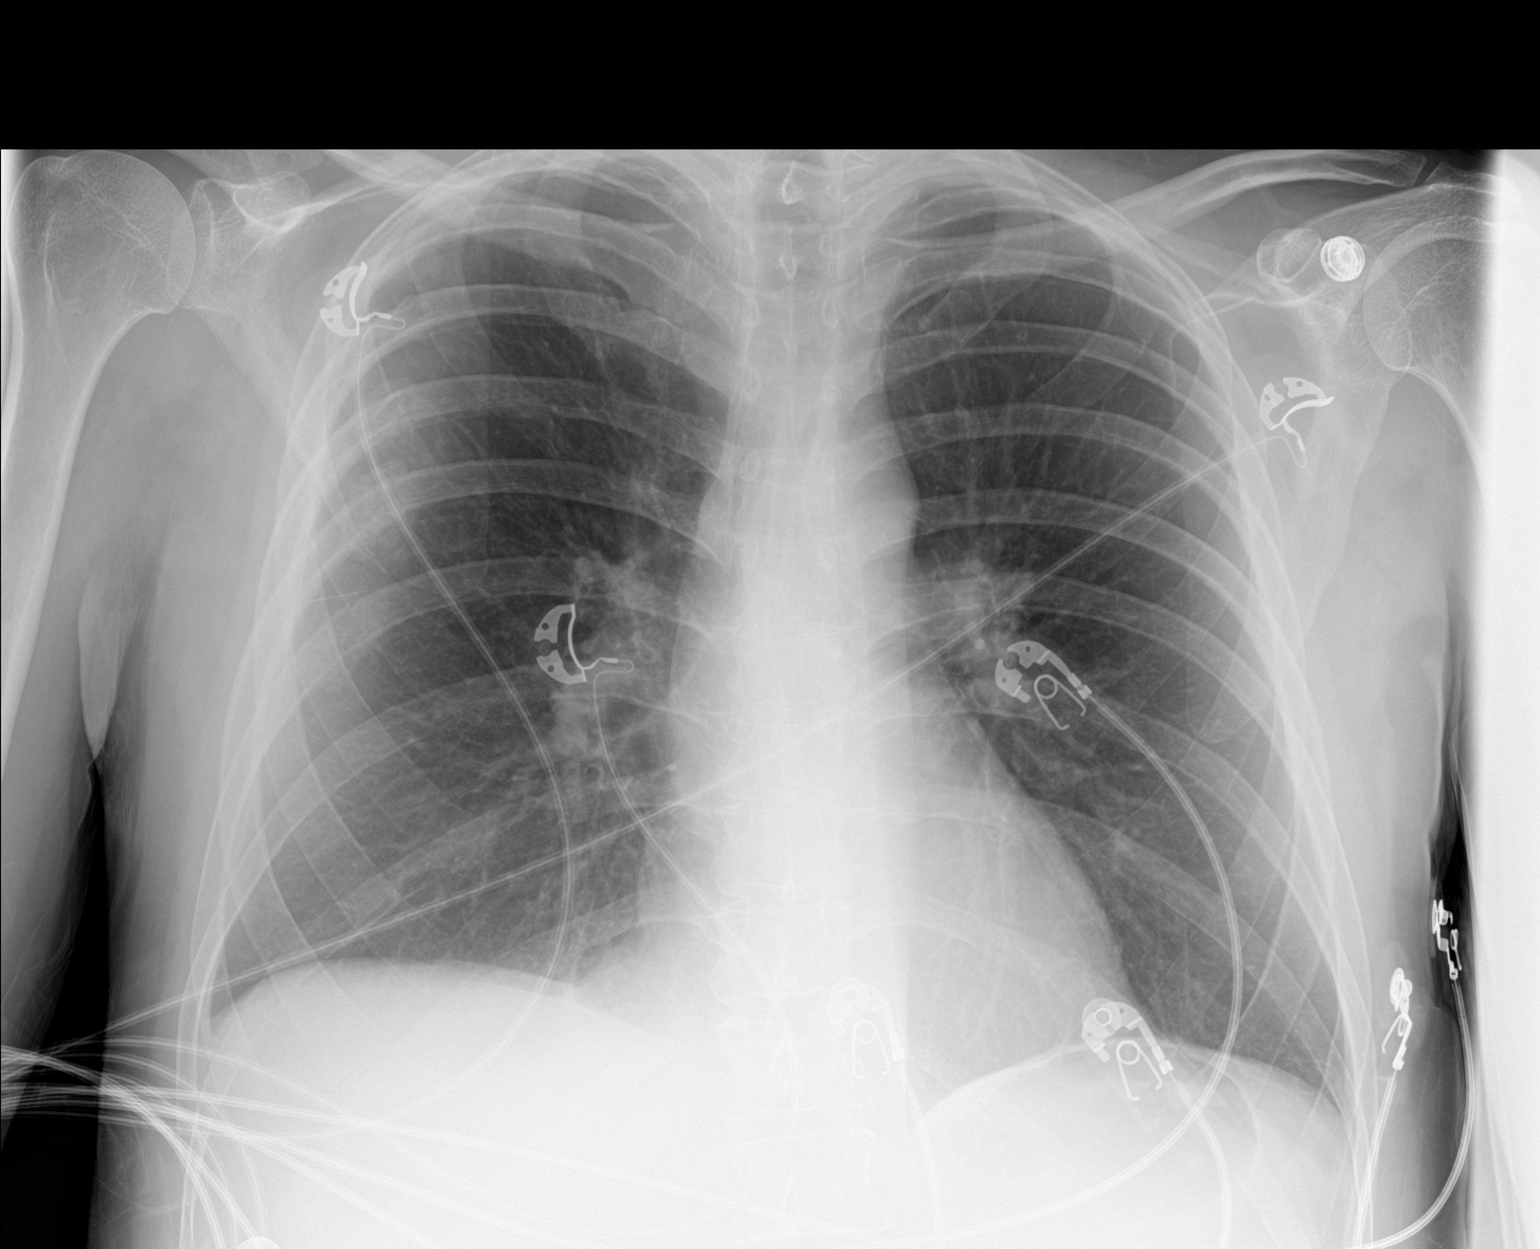

[1 of 1 positions shown; findings below may reference images not displayed]

FINDINGS: Trachea is midline. Heart size normal. Lungs are clear. There may be
a tiny right pleural effusion. No pneumothorax. There are
nondisplaced fractures of the right fourth through eighth
posterolateral ribs.
IMPRESSION: 1. Probable tiny right pleural effusion.
2. Nondisplaced fractures involving the posterolateral right fourth
through eighth ribs.

## 2021-08-17 IMAGING — CT CT ANGIO CHEST
2 of 6 series · 19 of 36 positions shown · IV contrast (omnipaque)
Comparison: None.

CLINICAL DATA: Shortness of breath, syncope.

EXAM:
CT ANGIOGRAPHY CHEST WITH CONTRAST
TECHNIQUE: Multidetector CT imaging of the chest was performed using the
standard protocol during bolus administration of intravenous
contrast. Multiplanar CT image reconstructions and MIPs were
obtained to evaluate the vascular anatomy.
CONTRAST:  65mL OMNIPAQUE IOHEXOL 350 MG/ML SOLN

[Series 8: pe thins · axial · 0.64mm/px · z∈[-307,-41]mm · 18 of 423 slices shown]
[im 22/423  lung]
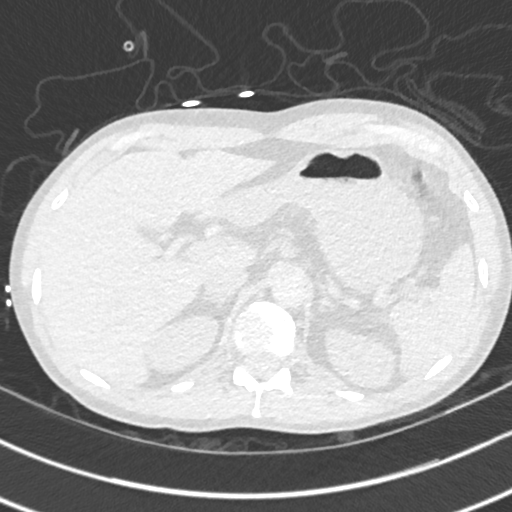
[im 43/423  mediastinal]
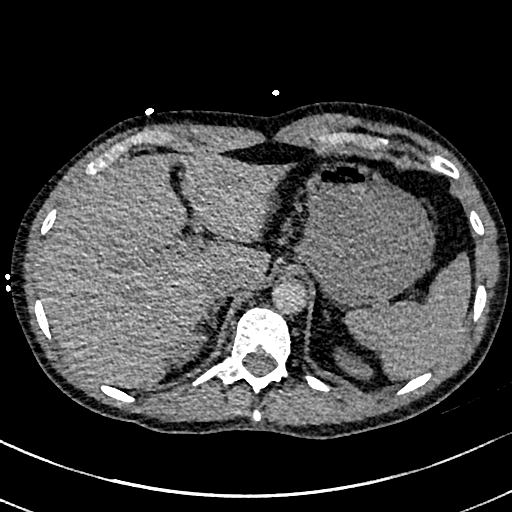
[im 64/423  lung]
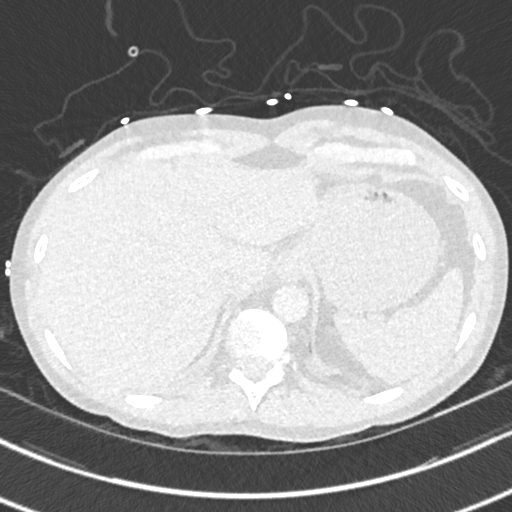
[im 85/423  mediastinal]
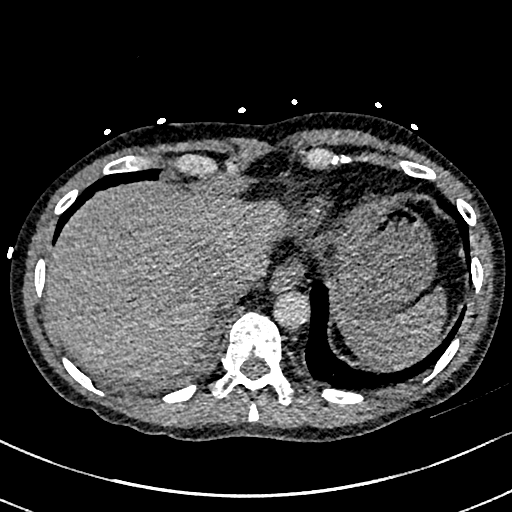
[im 106/423  lung]
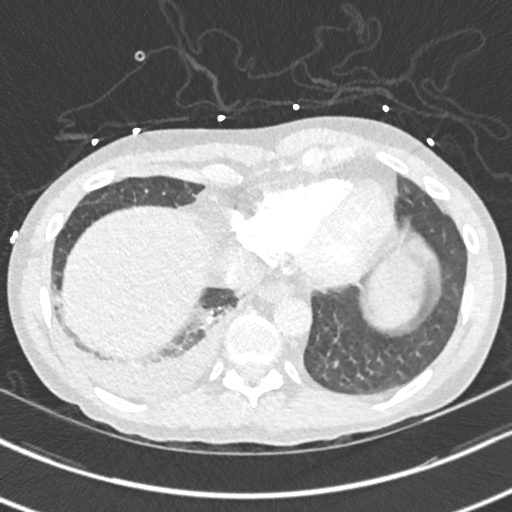
[im 127/423  mediastinal]
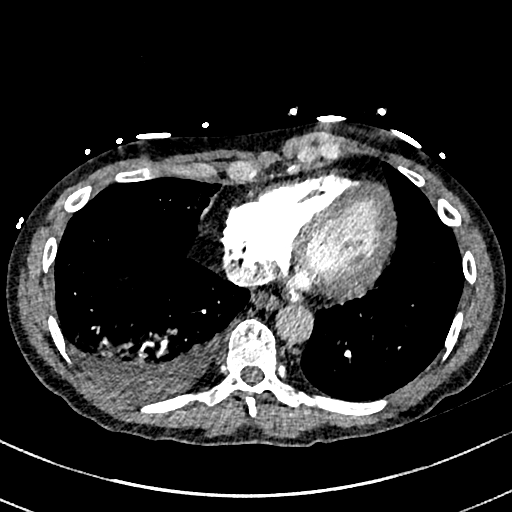
[im 148/423  lung]
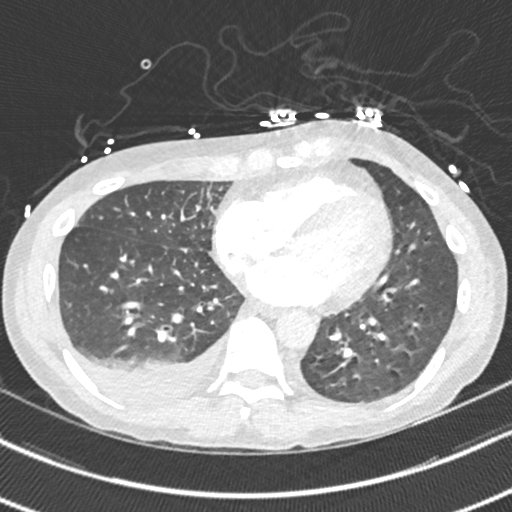
[im 169/423  mediastinal]
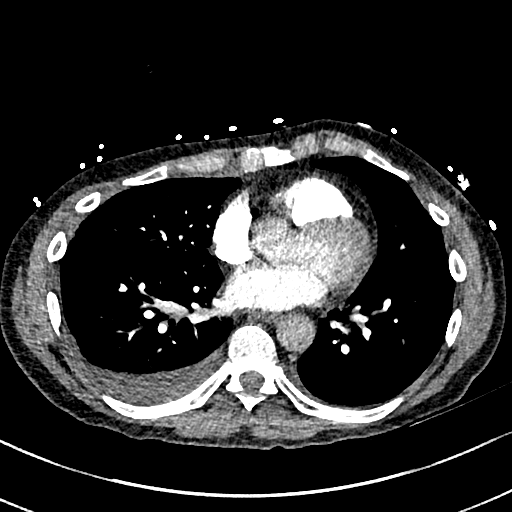
[im 190/423  lung]
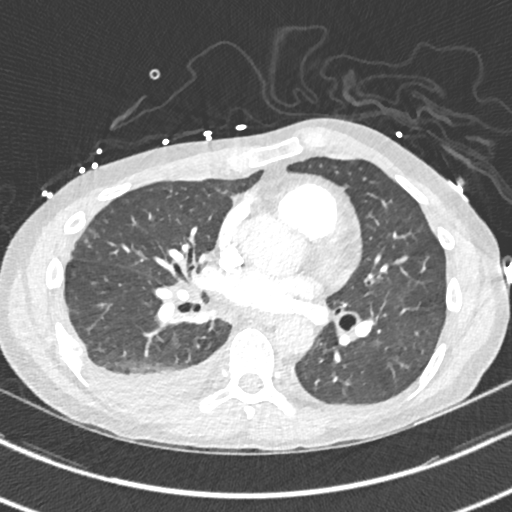
[im 233/423  mediastinal]
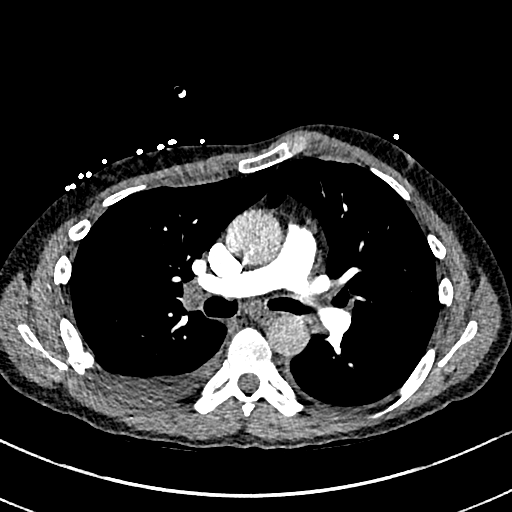
[im 254/423  lung]
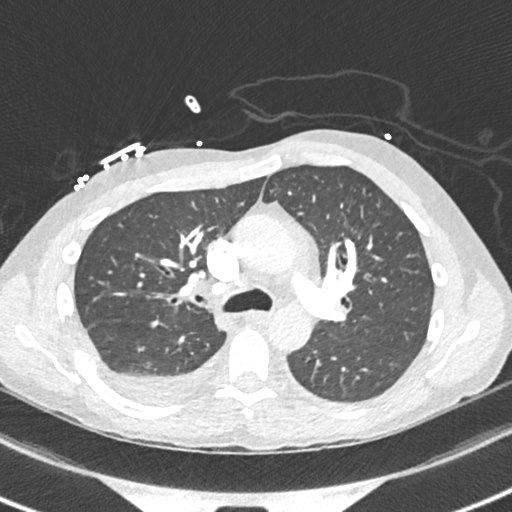
[im 275/423  mediastinal]
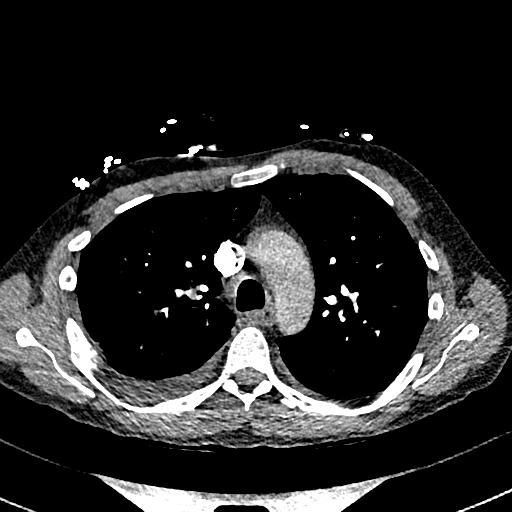
[im 296/423  lung]
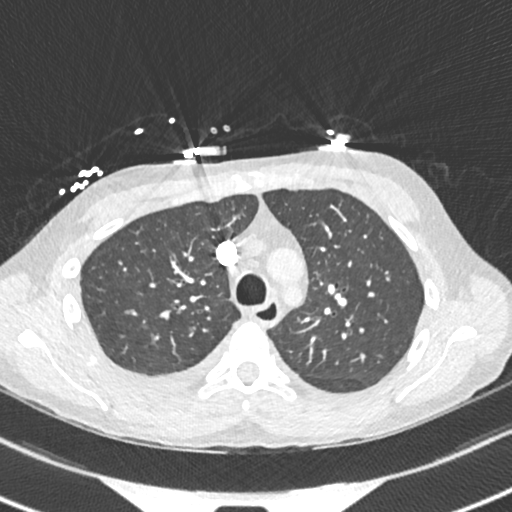
[im 317/423  mediastinal]
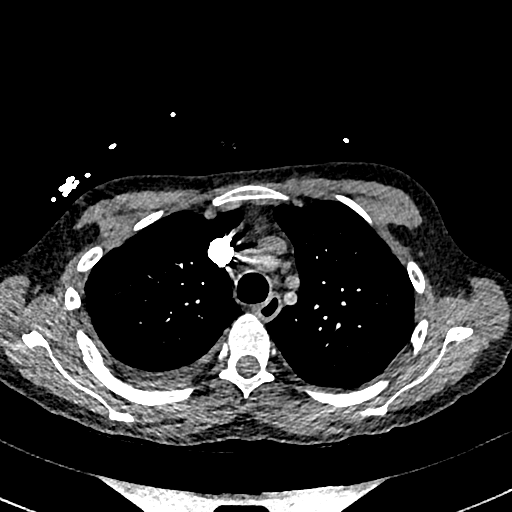
[im 338/423  lung]
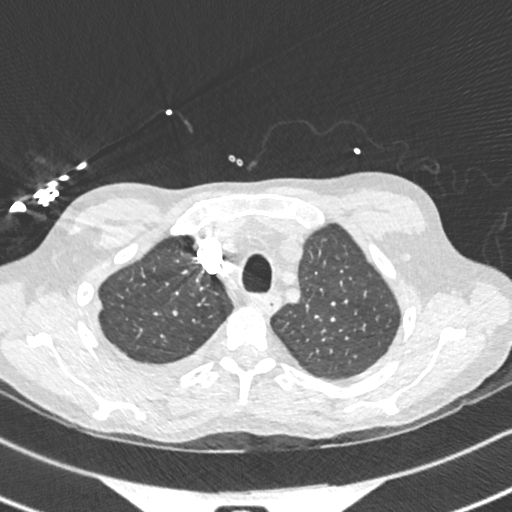
[im 359/423  mediastinal]
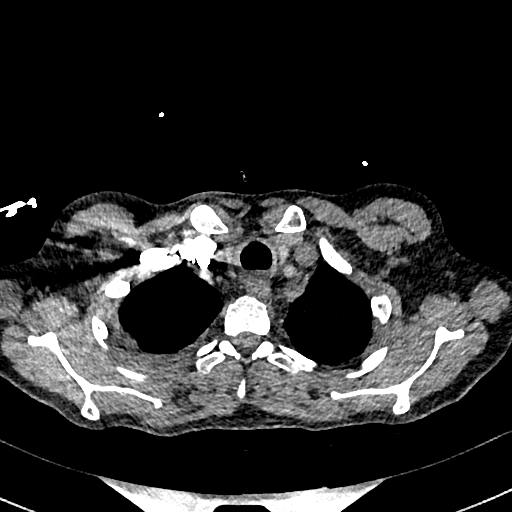
[im 380/423  lung]
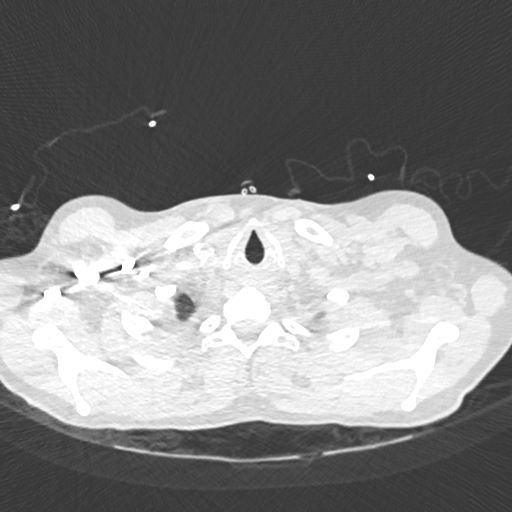
[im 401/423  mediastinal]
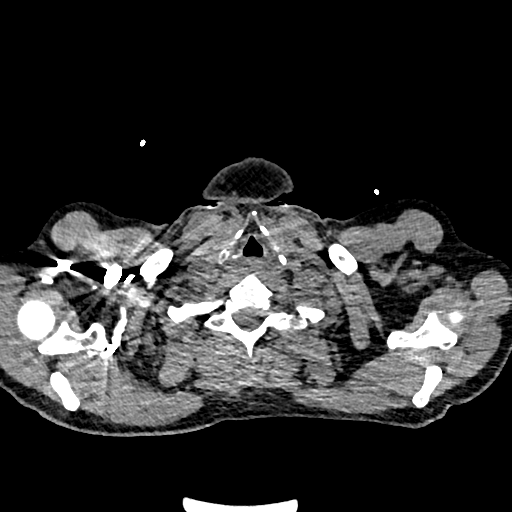

[Series 9: pe 2mm cor · coronal · 0.59mm/px · 1 of 110 slices shown]
[im 55/110  mediastinal]
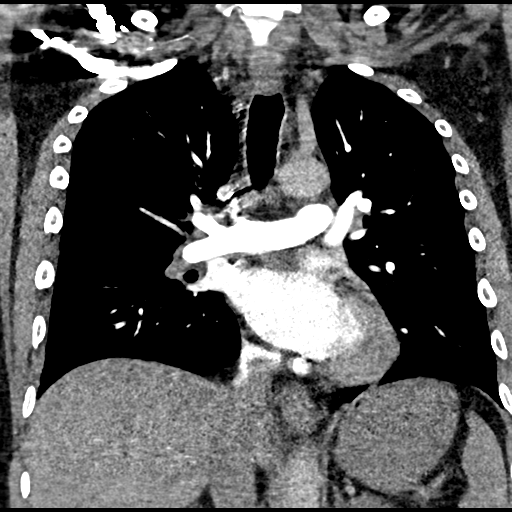

[19 of 36 positions shown; findings below may reference images not displayed]

FINDINGS: Cardiovascular: Image quality is somewhat degraded by respiratory
motion. Negative for pulmonary embolus. Vascular structures are
unremarkable. Heart size normal. No pericardial effusion.

Mediastinum/Nodes: No pathologically enlarged mediastinal lymph
nodes. Right hilar lymph node measures 11 mm. No axillary
adenopathy. Esophagus is grossly unremarkable.

Lungs/Pleura: Image quality is degraded by respiratory motion.
Dependent ground-glass in the right lower lobe. Mild ill-defined
centrilobular nodularity, upper and midlung zone predominant, likely
due to smoking or vaping related respiratory bronchiolitis. A few
millimetric pulmonary nodules measure up to 3 mm in the anterior
left lower lobe (7/91). Moderate right pleural effusion. Airway is
unremarkable.

Upper Abdomen: Visualized portions of the liver, gallbladder,
adrenal glands, kidneys, spleen, pancreas and stomach are grossly
unremarkable.

Musculoskeletal: There is an acute appearing nondisplaced fracture
of the right fifth posterolateral rib. Additional healed fractures
of mid and lower posterolateral right ribs.

Review of the MIP images confirms the above findings.
IMPRESSION: 1. Negative for pulmonary embolus.
2. Right lower lobe ground-glass may be due to pneumonia. Associated
moderate right pleural effusion.
3. Borderline enlarged right hilar lymph node, likely reactive.

## 2021-08-26 ENCOUNTER — Ambulatory Visit (INDEPENDENT_AMBULATORY_CARE_PROVIDER_SITE_OTHER): Payer: Medicaid Other | Admitting: Licensed Clinical Social Worker

## 2021-08-26 DIAGNOSIS — F200 Paranoid schizophrenia: Secondary | ICD-10-CM

## 2021-08-26 NOTE — Progress Notes (Signed)
   THERAPIST PROGRESS NOTE  Session Time: 30  Participation Level: Active  Behavioral Response: Casual and Fairly GroomedAlertAnxious and Depressed  Type of Therapy: Individual Therapy  Treatment Goals addressed: Decreased PHQ-9 below 10 and decreased GAD-7 below 5  ProgressTowards Goals: Progressing  Interventions: CBT, Motivational Interviewing, and Supportive  Summary: Turki Tapanes is a 46 y.o. male who presents with depressed and flat mood\affect.  Patient was pleasant, cooperative, maintained good eye contact.  He engaged well in therapy session was dressed casually.  Patient was alert and oriented x5.  Patient comes in today with primary stressor of sleeping.  Patient reports that he has been oversleeping on a daily basis.  Patient states that he has trouble with insomnia at night due to his frequent naps during the day.  LCSW encourage patient to participate in [facility] which runs Monday through Friday from 9 to 4 PM.  LCSW spoke with patient about setting alarms when he does take naps so he does not sleep for too long.  LCSW encourage patient to find daily routine to occupy time.  Patient reports other stressors as family conflict with his daughter [name].  Patient reports he is feeling increased depression due to not being able to see her on her birthday in person.  Patient reports that he will still video call her and send her a birthday present.  Suicidal/Homicidal: Nowithout intent/plan  Therapist Response:    Intervention/Plan: 1 for patient is to increase [facility] visits from Wednesday and Friday to Monday, Wednesday, Friday.  Patient to start setting alarms for taking naps to minimize number of hours naps are taken.  LCSW administered a GAD-7 and PHQ-9 in today's session.  PHQ-9 scoring in moderate depression category and GAD-7 scoring in the mild anxiety category.  LCSW reviewed scored with patient.   Plan:  Return again in 3 weeks.  Diagnosis: Paranoid schizophrenia ([place])  Collaboration of Care: Other none today   Patient/Guardian was advised Release of Information must be obtained prior to any record release in order to collaborate their care with an outside provider. Patient/Guardian was advised if they have not already done so to contact the registration department to sign all necessary forms in order for [place] to release information regarding their care.   Consent: Patient/Guardian gives verbal consent for treatment and assignment of benefits for services provided during this visit. Patient/Guardian expressed understanding and agreed to proceed.   Dory Horn, LCSW 08/26/2021

## 2021-08-26 NOTE — Plan of Care (Signed)
  Problem: Anxiety Disorder CCP Problem  1 GAD Goal:  walk 3 x weekly  Outcome: Progressing Goal: Decrease nightmare to 2/7 days weekly  Outcome: Progressing Goal: LTG: Patient will score less than 5 on the Generalized Anxiety Disorder 7 Scale (GAD-7) Outcome: Progressing Goal: STG: Patient will complete at least 80% of assigned homework Outcome: Progressing Goal: STG: Report a decrease in anxiety symptoms as evidenced by an overall reduction in anxiety score by a minimum of 25% on the Generalized Anxiety Disorder Scale Outcome: Progressing   Problem: Anxiety Disorder CCP Problem  1 GAD Goal: Find independent housing  Outcome: Not Progressing Goal: STG: Patient will practice problem solving skills 3 times per week for the next 4 weeks Outcome: Not Progressing

## 2021-09-03 ENCOUNTER — Other Ambulatory Visit (HOSPITAL_COMMUNITY): Payer: Self-pay | Admitting: Psychiatry

## 2021-09-03 ENCOUNTER — Telehealth (HOSPITAL_COMMUNITY): Payer: Self-pay | Admitting: *Deleted

## 2021-09-03 ENCOUNTER — Ambulatory Visit (HOSPITAL_COMMUNITY): Payer: Medicaid Other

## 2021-09-03 DIAGNOSIS — F200 Paranoid schizophrenia: Secondary | ICD-10-CM

## 2021-09-03 MED ORDER — ABILIFY MAINTENA 400 MG IM PRSY: PREFILLED_SYRINGE | INTRAMUSCULAR | 10 refills | Status: DC

## 2021-09-03 NOTE — Telephone Encounter (Signed)
Called to Artist he couldn't get his shot, its on backorder. I called [facility] and its on the shelf at the one on [address]. Isaiah Canner DNP called it in for a dose at [facility], I called him back to inform and he will bring his shot and self in tomorrow before noon to get his injection.

## 2021-09-04 ENCOUNTER — Other Ambulatory Visit (HOSPITAL_COMMUNITY): Payer: Self-pay | Admitting: Psychiatry

## 2021-09-04 ENCOUNTER — Encounter (HOSPITAL_COMMUNITY): Payer: Self-pay

## 2021-09-04 ENCOUNTER — Ambulatory Visit (INDEPENDENT_AMBULATORY_CARE_PROVIDER_SITE_OTHER): Payer: Medicaid Other | Admitting: *Deleted

## 2021-09-04 VITALS — BP 110/73 | HR 52 | Ht 65.0 in | Wt 157.0 lb

## 2021-09-04 DIAGNOSIS — F200 Paranoid schizophrenia: Secondary | ICD-10-CM

## 2021-09-04 MED ORDER — ABILIFY MAINTENA 400 MG IM PRSY: PREFILLED_SYRINGE | INTRAMUSCULAR | 10 refills | Status: DC

## 2021-09-04 NOTE — Progress Notes (Signed)
In a day late for shot today due to not being able to pick his shot up till yesterday pm. We will call his rx now into [place] and have it delievered. He is doing well, working on crafts. No complaints voiced. Abilify M 400 mg inj given in his R DELTOID without issue.

## 2021-09-15 ENCOUNTER — Ambulatory Visit (INDEPENDENT_AMBULATORY_CARE_PROVIDER_SITE_OTHER): Payer: Medicaid Other | Admitting: Licensed Clinical Social Worker

## 2021-09-15 DIAGNOSIS — F411 Generalized anxiety disorder: Secondary | ICD-10-CM

## 2021-09-15 DIAGNOSIS — F200 Paranoid schizophrenia: Secondary | ICD-10-CM

## 2021-09-15 NOTE — Progress Notes (Signed)
   THERAPIST PROGRESS NOTE  Session Time: 25  Participation Level: Active  Behavioral Response: CasualAlertAnxious and Depressed  Type of Therapy: Individual Therapy  Treatment Goals addressed: Walk 3 times weekly.  ProgressTowards Goals: Progressing  Interventions: CBT and Motivational Interviewing  Summary: Isaiah Black is a 46 y.o. male who presents with depressed and anxious mood\affect.  Patient was pleasant, cooperative, maintained good eye contact.  Isaiah Black was alert and oriented x5.  He engaged well in therapy session and was dressed casually.  Patient reports primary stressors as family conflict, family illness, and housing.  Patient reports that his sister was in the hospital since Sunday with stomach pain.  Patient reports that him and his mother have been watching his niece.  Patient reports that it has been stressful as they do not know what is wrong with his sister at this time.  Patient also reports feeling stressed because his daughter's birthday is in 6 days and he is unable to go to [place] to see her.  Patient reports attempting to make the most of the situation by getting her presents and being able to FaceTime her.  Patient reports utilizing coping skills as [facility] at least 1 time weekly, but goal\objective it is 2-3 times a week.  Patient reports housing problem for cockroaches that have been steadily getting worse over the summer.  Suicidal/Homicidal: Nowithout intent/plan  Therapist Response:    Intervention/Plan: LCSW administered GAD-7.  LCSW administered the PHQ-9.  Patient increased processing GAD-7 and PHQ-9 scores from last session.  Patient reports stressors for daughter's birthday, and housing.  LCSW educated patient on taking medications as prescribed.  LCSW and patient spoke about plan of attending [facility] day program at least 2 times weekly.  LCSW and patient spoke about utilizing coping skills for walking at least 3 times  weekly.  LCSW used supportive therapy for praise and encouragement.  LCSW used empowerment for person centered therapy.    Plan: Return again in 3 weeks.  Diagnosis: Paranoid schizophrenia ([place])  Generalized anxiety disorder  Collaboration of Care: Other None today  Patient/Guardian was advised Release of Information must be obtained prior to any record release in order to collaborate their care with an outside provider. Patient/Guardian was advised if they have not already done so to contact the registration department to sign all necessary forms in order for [place] to release information regarding their care.   Consent: Patient/Guardian gives verbal consent for treatment and assignment of benefits for services provided during this visit. Patient/Guardian expressed understanding and agreed to proceed.   Isaiah Horn, LCSW 09/15/2021

## 2021-09-15 NOTE — Plan of Care (Signed)
  Problem: Anxiety Disorder CCP Problem  1 GAD Goal:  walk 3 x weekly  Outcome: Progressing Goal: Decrease nightmare to 2/7 days weekly  Outcome: Progressing Goal: STG: Patient will complete at least 80% of assigned homework Outcome: Progressing Goal: STG: Patient will practice problem solving skills 3 times per week for the next 4 weeks Outcome: Progressing   Problem: Anxiety Disorder CCP Problem  1 GAD Goal: Find independent housing  Outcome: Not Progressing Goal: LTG: Patient will score less than 5 on the Generalized Anxiety Disorder 7 Scale (GAD-7) Outcome: Not Progressing Goal: STG: Report a decrease in anxiety symptoms as evidenced by an overall reduction in anxiety score by a minimum of 25% on the Generalized Anxiety Disorder Scale Outcome: Not Progressing

## 2021-09-23 ENCOUNTER — Telehealth (HOSPITAL_COMMUNITY): Payer: Self-pay | Admitting: *Deleted

## 2021-09-23 NOTE — Telephone Encounter (Signed)
Opened a second time in error. 

## 2021-09-23 NOTE — Telephone Encounter (Signed)
Noticed on future calendar patient was scheduled for his next inj on a Friday. We dont generally give shots on Friday. I called him to see if he knew why we had put him on a Friday and he did not know or have a special request for a Friday. Changed his shot time to Thurs the 31st at 930. Will make the front desk aware of the change.

## 2021-10-01 ENCOUNTER — Ambulatory Visit (INDEPENDENT_AMBULATORY_CARE_PROVIDER_SITE_OTHER): Payer: Medicaid Other | Admitting: *Deleted

## 2021-10-01 ENCOUNTER — Encounter (HOSPITAL_COMMUNITY): Payer: Self-pay

## 2021-10-01 VITALS — BP 119/96 | HR 103 | Ht 65.0 in | Wt 157.0 lb

## 2021-10-01 DIAGNOSIS — F200 Paranoid schizophrenia: Secondary | ICD-10-CM | POA: Diagnosis not present

## 2021-10-01 NOTE — Progress Notes (Signed)
In with his mom for his monthly injection of Abilifiy M 400 mg, given today in his L DELTOID.He showed me projects, crafts he is working on.  No complaints voiced, at baseline. He is to return in 28 days for his next shot.

## 2021-10-02 ENCOUNTER — Ambulatory Visit (HOSPITAL_COMMUNITY): Payer: Medicaid Other

## 2021-10-20 ENCOUNTER — Ambulatory Visit (INDEPENDENT_AMBULATORY_CARE_PROVIDER_SITE_OTHER): Payer: Medicaid Other | Admitting: Licensed Clinical Social Worker

## 2021-10-20 DIAGNOSIS — F200 Paranoid schizophrenia: Secondary | ICD-10-CM | POA: Diagnosis not present

## 2021-10-20 DIAGNOSIS — F411 Generalized anxiety disorder: Secondary | ICD-10-CM

## 2021-10-20 NOTE — Plan of Care (Signed)
  Problem: Anxiety Disorder CCP Problem  1 GAD Goal:  walk 3 x weekly  Outcome: Progressing Goal: Decrease nightmare to 2/7 days weekly  Outcome: Progressing Goal: STG: Patient will complete at least 80% of assigned homework Outcome: Progressing Goal: STG: Patient will practice problem solving skills 3 times per week for the next 4 weeks Outcome: Progressing Goal: STG: Report a decrease in anxiety symptoms as evidenced by an overall reduction in anxiety score by a minimum of 25% on the Generalized Anxiety Disorder Scale Outcome: Progressing   Problem: Anxiety Disorder CCP Problem  1 GAD Goal: Find independent housing  Outcome: Not Progressing

## 2021-10-20 NOTE — Progress Notes (Signed)
   THERAPIST PROGRESS NOTE  Session Time: 30   Participation Level: Active  Behavioral Response: CasualAlertAnxious  Type of Therapy: Individual Therapy  Treatment Goals addressed: walk 3 x weekly   ProgressTowards Goals: Progressing  Interventions: Motivational Interviewing, Solution Focused, and Supportive  Summary: Isaiah Black is a 46 y.o. male who presents with depressed and anxious mood\affect patient was pleasant, cooperative, maintained good eye contact.  Patient was alert and oriented x5.  Patient reports primary stressors as financials, ADLs, and insomnia.  Patient reports that he has been having reoccurring nightmares about going back to prison.  Patient reports that these are happening 2 times nightly.  Other stressors for patient are ADLs for showering patient reports only showering 1 time weekly.  LCSW states goal is 2-3 times weekly.  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  Patient reports that he has been engaging in [facility] 1-2 times weekly.  Isaiah Black states that he has been compliant with all probation criteria.  Patient reports walking at least 3 times weekly.  Suicidal/Homicidal: Nowithout intent/plan  Therapist Response:     Intervention/Plan: LCSW use psychoanalytic therapy for patient to express thoughts, feelings, and emotions in session.  LCSW used supportive therapy for praise and encouragement.  LCSW educated patient on taking medications as prescribed.  LCSW encourage patient to shower at least 2 times weekly over the next 4 weeks.  Patient to continue to engage and [facility] activities.  LCSW administered the GAD-7 and PHQ-9.  Both PHQ-9 and GAD-7 decreased in today's session.  LCSW reviewed scores with patient  Plan: Return again in 3 weeks.  Diagnosis: Paranoid schizophrenia ([place])  Collaboration of Care: Other None today  Patient/Guardian was advised Release  of Information must be obtained prior to any record release in order to collaborate their care with an outside provider. Patient/Guardian was advised if they have not already done so to contact the registration department to sign all necessary forms in order for [place] to release information regarding their care.   Consent: Patient/Guardian gives verbal consent for treatment and assignment of benefits for services provided during this visit. Patient/Guardian expressed understanding and agreed to proceed.   Isaiah Horn, LCSW 10/20/2021

## 2021-10-22 ENCOUNTER — Ambulatory Visit (INDEPENDENT_AMBULATORY_CARE_PROVIDER_SITE_OTHER): Payer: Medicaid Other | Admitting: Primary Care

## 2021-10-22 ENCOUNTER — Encounter (INDEPENDENT_AMBULATORY_CARE_PROVIDER_SITE_OTHER): Payer: Self-pay | Admitting: Primary Care

## 2021-10-22 VITALS — BP 116/83 | HR 89 | Resp 16 | Wt 161.4 lb

## 2021-10-22 DIAGNOSIS — R21 Rash and other nonspecific skin eruption: Secondary | ICD-10-CM | POA: Diagnosis not present

## 2021-10-22 DIAGNOSIS — Z0000 Encounter for general adult medical examination without abnormal findings: Secondary | ICD-10-CM

## 2021-10-22 DIAGNOSIS — J302 Other seasonal allergic rhinitis: Secondary | ICD-10-CM

## 2021-10-22 DIAGNOSIS — Z760 Encounter for issue of repeat prescription: Secondary | ICD-10-CM

## 2021-10-22 DIAGNOSIS — Z1211 Encounter for screening for malignant neoplasm of colon: Secondary | ICD-10-CM

## 2021-10-22 DIAGNOSIS — J4540 Moderate persistent asthma, uncomplicated: Secondary | ICD-10-CM | POA: Diagnosis not present

## 2021-10-22 MED ORDER — ALBUTEROL SULFATE HFA 108 (90 BASE) MCG/ACT IN AERS: 2.0000 | INHALATION_SPRAY | Freq: Four times a day (QID) | RESPIRATORY_TRACT | 3 refills | Status: DC | PRN

## 2021-10-22 MED ORDER — MONTELUKAST SODIUM 10 MG PO TABS: 10.0000 mg | ORAL_TABLET | Freq: Every day | ORAL | 3 refills | Status: DC

## 2021-10-22 MED ORDER — BETAMETHASONE VALERATE 0.1 % EX OINT: 1.0000 | TOPICAL_OINTMENT | Freq: Two times a day (BID) | CUTANEOUS | 3 refills | Status: AC

## 2021-10-22 MED ORDER — BUDESONIDE-FORMOTEROL FUMARATE 160-4.5 MCG/ACT IN AERO: 2.0000 | INHALATION_SPRAY | Freq: Two times a day (BID) | RESPIRATORY_TRACT | 4 refills | Status: DC

## 2021-10-22 NOTE — Progress Notes (Signed)
    [facility]        Subjective:     [name] is an 46 y.o. male who presents for follow up of asthma. The patient is not currently have symptoms / an exacerbation. The patient has been having episodes  of asthma having to use his rescue Laurence Ferrari more often. Symptoms in previous episodes have included dyspnea, non-productive cough, and wheezing, and typically last these episodes last until he uses his [place]. Previous episodes have been triggered by animal dander, cold air going to hot air (temperature change)  dust, and pollens. Treatments tried during prior episodes include short-acting inhaled beta-adrenergic agonists, which usually provides some relief of symptoms.  He also voices concerns about a rash that seems to be spreading initially started on his left lower leg neck his right shoulder right shoulder previously prescribed triamcinolone which did not clearly do help with the itching.Patient has No headache, No chest pain, No abdominal pain - No Nausea, No new weakness tingling or numbness ,Current Disease Severity Requan has frequent daytime asthma symptoms. He has weekly nighttime asthma symptoms. The patient is using short-acting beta agonists for symptom control several times per day. He has exacerbations requiring oral systemic corticosteroids 0 times per year. Current limitations in activity from asthma:  exercising      The following portions of the patient's history were reviewed and updated as appropriate: allergies, current medications, past family history, past medical history, past social history, past surgical history, and problem list.  Review of Systems Pertinent items noted in HPI and remainder of comprehensive ROS otherwise negative.    Objective:  BP 116/83   Pulse 89   Resp 16   Wt 161 lb 6.4 oz (73.2 kg)   SpO2 95%   BMI 26.86 kg/m    Assessment:  Isaiah Black was seen today for asthma.  Diagnoses and all orders for this visit:  Colon cancer  screening -     Ambulatory referral to Gastroenterology  Moderate persistent asthma without complication -     budesonide-formoterol (SYMBICORT) 160-4.5 MCG/ACT inhaler; Inhale 2 puffs into the lungs 2 (two) times daily. -     albuterol (VENTOLIN HFA) 108 (90 Base) MCG/ACT inhaler; Inhale 2 puffs into the lungs every 6 (six) hours as needed for wheezing or shortness of breath.  Medication refill -     budesonide-formoterol (SYMBICORT) 160-4.5 MCG/ACT inhaler; Inhale 2 puffs into the lungs 2 (two) times daily. -     albuterol (VENTOLIN HFA) 108 (90 Base) MCG/ACT inhaler; Inhale 2 puffs into the lungs every 6 (six) hours as needed for wheezing or shortness of breath.  Healthcare maintenance -     HCV Ab w Reflex to Quant PCR  Seasonal allergies  Rash and nonspecific skin eruption       -     Ambulatory referral to Dermatology -     betamethasone valerate ointment (VALISONE) 0.1 %; Apply 1 Application topically 2 (two) times daily. -     Vitamin D, 25-hydroxy  Other orders -     montelukast (SINGULAIR) 10 MG tablet; Take 1 tablet (10 mg total) by mouth at bedtime.  This note has been created with Surveyor, quantity. Any transcriptional errors are unintentional.   Kerin Perna, NP 10/22/2021, 10:06 AM

## 2021-10-22 NOTE — Patient Instructions (Signed)
Asthma Action Plan, Adult An asthma action plan helps you understand how to manage your asthma and what to do when you have an asthma attack. The action plan is a color-coded plan that lists the symptoms that indicate whether or not your condition is under control and what actions to take. If you have symptoms in the green zone, you are doing well. If you have symptoms in the yellow zone, you are having problems. If you have symptoms in the red zone, you need medical care right away. Follow the plan that you and your health care provider develop. Review your plan with your health care provider at each visit. What triggers your asthma? Knowing the things that can trigger an asthma attack or make your asthma symptoms worse is very important. Talk to your health care provider about your asthma triggers and how to avoid them. Record your known asthma triggers here: _______________ What is your personal best peak flow reading? If you use a peak flow meter, determine your personal best reading. Record it here: _______________ Green zone This zone means that your asthma is under control. You may not have any symptoms while you are in the green zone. This means that you: Have no coughing or wheezing, even while you are working or playing. Sleep through the night. Are breathing well. Have a peak flow reading that is above __________ (80% of your personal best or greater). If you are in the green zone, continue to manage your asthma as directed. Take these medicines every day: Controller medicine and dosage: _______________ Controller medicine and dosage: _______________ Controller medicine and dosage: _______________ Controller medicine and dosage: _______________ Before exercise, use this reliever or rescue medicine: _______________ Call your health care provider if you are using a reliever or rescue medicine more than 2-3 times a week. Yellow zone Symptoms in this zone mean that your condition may  be getting worse. You may have symptoms that interfere with exercise, are noticeably worse after exposure to triggers, or are worse at the first sign of a cold (upper respiratory infection). These may include: Waking from sleep. Coughing, especially at night or first thing in the morning. Mild wheezing. Chest tightness. A peak flow reading that is __________ to __________ (50-79% of your personal best). If you have any of these symptoms: Add the following medicine to the ones that you use daily: Reliever or rescue medicine and dosage: _______________ Additional medicine and dosage: _______________ Call your health care provider if: You remain in the yellow zone for __________ hours. You are using a reliever or rescue medicine more than 2-3 times a week. Red zone Symptoms in this zone mean that you should get medical help right away. You will likely feel distressed and have symptoms at rest that restrict your activity. You are in the red zone if: You are breathing hard and quickly. Your nose opens wide, your ribs show, and your neck muscles become visible when you breathe in. Your lips, fingers, or toes are a bluish color. You have trouble speaking in full sentences. Your peak flow reading is less than __________ (less than 50% of your personal best). Your symptoms do not improve within 15-20 minutes after you use your reliever or rescue medicine (bronchodilator). If you have any of these symptoms: These symptoms represent a serious problem that is an emergency. Do not wait to see if the symptoms will go away. Get medical help right away. Call your local emergency services (911 in the [place] Do not drive yourself   to the hospital. Use your reliever or rescue medicine. Start a nebulizer treatment or take 2-4 puffs from a metered-dose inhaler with a spacer. Repeat this action every 15-20 minutes until help arrives. Where to find more information You can find more information about asthma  from: Centers for Disease Control and Prevention: www.cdc.gov American Lung Association: www.lung.org This information is not intended to replace advice given to you by your health care provider. Make sure you discuss any questions you have with your health care provider. Document Revised: 03/13/2020 Document Reviewed: 03/13/2020 Elsevier Patient Education  2023 Elsevier Inc.  

## 2021-10-23 LAB — HCV INTERPRETATION

## 2021-10-23 LAB — VITAMIN D 25 HYDROXY (VIT D DEFICIENCY, FRACTURES): Vit D, 25-Hydroxy: 22.6 ng/mL — ABNORMAL LOW (ref 30.0–100.0)

## 2021-10-23 LAB — HCV AB W REFLEX TO QUANT PCR: HCV Ab: NONREACTIVE

## 2021-10-27 ENCOUNTER — Ambulatory Visit (HOSPITAL_COMMUNITY): Payer: Medicaid Other | Admitting: Licensed Clinical Social Worker

## 2021-10-29 ENCOUNTER — Encounter (HOSPITAL_COMMUNITY): Payer: Self-pay

## 2021-10-29 ENCOUNTER — Ambulatory Visit (INDEPENDENT_AMBULATORY_CARE_PROVIDER_SITE_OTHER): Payer: Medicaid Other | Admitting: *Deleted

## 2021-10-29 VITALS — BP 121/81 | HR 89 | Ht 65.0 in | Wt 160.0 lb

## 2021-10-29 DIAGNOSIS — F200 Paranoid schizophrenia: Secondary | ICD-10-CM | POA: Diagnosis not present

## 2021-10-29 NOTE — Progress Notes (Signed)
In accompanied by his mom for his monthly inj of Abilify M 400 mg. Given today in his R DELTOID without issues. Informed them today that Dr Ronne Binning would be the clinic provider starting mid Oct and that I would be leaving the clinic in a month but his services would not change and the other two clinic staff will still be here. Continue to be busy with his crafting projects as is mom. They are volunteering today at a medical clinic doing crafts with anyone that wants to do them. To return in 28 days for next inj and will need to see a provider that visit, one not available today.

## 2021-11-04 ENCOUNTER — Telehealth (INDEPENDENT_AMBULATORY_CARE_PROVIDER_SITE_OTHER): Payer: Self-pay

## 2021-11-04 NOTE — Telephone Encounter (Signed)
Contacted pt to go over lab results pt is aware and doesn't have any questions or concerns 

## 2021-11-20 ENCOUNTER — Other Ambulatory Visit (HOSPITAL_COMMUNITY): Payer: Self-pay | Admitting: Psychiatry

## 2021-11-20 DIAGNOSIS — G2401 Drug induced subacute dyskinesia: Secondary | ICD-10-CM

## 2021-11-24 ENCOUNTER — Ambulatory Visit (INDEPENDENT_AMBULATORY_CARE_PROVIDER_SITE_OTHER): Payer: Medicaid Other | Admitting: Licensed Clinical Social Worker

## 2021-11-24 DIAGNOSIS — F200 Paranoid schizophrenia: Secondary | ICD-10-CM | POA: Diagnosis not present

## 2021-11-24 NOTE — Plan of Care (Signed)
  Problem: Anxiety Disorder CCP Problem  1 GAD Goal:  walk 3 x weekly  Outcome: Progressing Goal: Decrease nightmare to 2/7 days weekly  Outcome: Progressing Goal: STG: Patient will complete at least 80% of assigned homework Outcome: Progressing Goal: STG: Patient will practice problem solving skills 3 times per week for the next 4 weeks Outcome: Progressing   Problem: Anxiety Disorder CCP Problem  1 GAD Goal: Find independent housing  Outcome: Not Progressing Goal: LTG: Patient will score less than 5 on the Generalized Anxiety Disorder 7 Scale (GAD-7) Outcome: Not Progressing Goal: STG: Report a decrease in anxiety symptoms as evidenced by an overall reduction in anxiety score by a minimum of 25% on the Generalized Anxiety Disorder Scale Outcome: Not Progressing

## 2021-11-24 NOTE — Progress Notes (Signed)
   THERAPIST PROGRESS NOTE  Virtual Visit via Video Note  I connected with Isaiah Black on 11/24/21 at  8:00 AM EDT by a video enabled telemedicine application and verified that I am speaking with the correct person using two identifiers.  Location: Patient: [facility]  Provider: Providers Home    I discussed the limitations of evaluation and management by telemedicine and the availability of in person appointments. The patient expressed understanding and agreed to proceed.     I discussed the assessment and treatment plan with the patient. The patient was provided an opportunity to ask questions and all were answered. The patient agreed with the plan and demonstrated an understanding of the instructions.   The patient was advised to call back or seek an in-person evaluation if the symptoms worsen or if the condition fails to improve as anticipated.  I provided 30 minutes of non-face-to-face time during this encounter.   Dory Horn, LCSW   Participation Level: Active  Behavioral Response: CasualAlertAnxious and Depressed  Type of Therapy: Individual Therapy  Treatment Goals addressed: decrease GAD-7 below 5   ProgressTowards Goals: Not Progressing  Interventions: Motivational Interviewing, Strength-based, and Supportive   Suicidal/Homicidal: Nowithout intent/plan  Therapist Response:   Pt was alert and oriented x 5. He was dressed casually and engaged well in therapy session. Pt presented with depressed and anxious mood/affect. She was pleasant, cooperative, and maintained good eye contact.   Pt reports illness, work, and anxiety are his primary stressors. Pt states that he has been struggling with symptoms for itchy legs that he needs to get checked out. Pt reports that he has an appointment set up. Pt also reports needing a colonoscopy as his family has Hx of colon cancer. Pt endorses symptoms for tension, worry, and insomnia. LCSW spoke with pt about coping  skills. Pt reports primary coping skills are walking, working on his [employer], and going to [employer].   Interventions/Plan: LCSW administered a GAD-7. LCSW administered a PHQ-9. LCSW saw no changes in PHQ-9. LCSW notes an increase by 1 for GAD-7. LCSW used supportive therapy for praise and encouragement. Plan for pt is to continue to use coping skills for walking, working on [employer], and support grounds at [employer].      Plan: Return again in 3 weeks.  Diagnosis: No diagnosis found.  Collaboration of Care: Other None today   Patient/Guardian was advised Release of Information must be obtained prior to any record release in order to collaborate their care with an outside provider. Patient/Guardian was advised if they have not already done so to contact the registration department to sign all necessary forms in order for [place] to release information regarding their care.   Consent: Patient/Guardian gives verbal consent for treatment and assignment of benefits for services provided during this visit. Patient/Guardian expressed understanding and agreed to proceed.   Dory Horn, LCSW 11/24/2021

## 2021-11-26 ENCOUNTER — Encounter (HOSPITAL_COMMUNITY): Payer: Self-pay

## 2021-11-26 ENCOUNTER — Ambulatory Visit (INDEPENDENT_AMBULATORY_CARE_PROVIDER_SITE_OTHER): Payer: Medicaid Other | Admitting: Psychiatry

## 2021-11-26 ENCOUNTER — Ambulatory Visit (INDEPENDENT_AMBULATORY_CARE_PROVIDER_SITE_OTHER): Payer: Medicaid Other | Admitting: *Deleted

## 2021-11-26 VITALS — BP 120/77 | HR 115 | Ht 65.0 in | Wt 167.0 lb

## 2021-11-26 DIAGNOSIS — F32A Depression, unspecified: Secondary | ICD-10-CM

## 2021-11-26 DIAGNOSIS — F411 Generalized anxiety disorder: Secondary | ICD-10-CM

## 2021-11-26 DIAGNOSIS — F200 Paranoid schizophrenia: Secondary | ICD-10-CM

## 2021-11-26 DIAGNOSIS — G2401 Drug induced subacute dyskinesia: Secondary | ICD-10-CM

## 2021-11-26 MED ORDER — MIRTAZAPINE 7.5 MG PO TABS: ORAL_TABLET | ORAL | 3 refills | Status: DC

## 2021-11-26 MED ORDER — ABILIFY MAINTENA 400 MG IM PRSY: PREFILLED_SYRINGE | INTRAMUSCULAR | 10 refills | Status: DC

## 2021-11-26 MED ORDER — BUSPIRONE HCL 15 MG PO TABS: 30.0000 mg | ORAL_TABLET | Freq: Two times a day (BID) | ORAL | 3 refills | Status: DC

## 2021-11-26 MED ORDER — VALBENAZINE TOSYLATE 40 MG PO CAPS: 40.0000 mg | ORAL_CAPSULE | Freq: Every day | ORAL | 3 refills | Status: DC

## 2021-11-26 MED ORDER — HYDROXYZINE HCL 50 MG PO TABS: ORAL_TABLET | ORAL | 3 refills | Status: DC

## 2021-11-26 NOTE — Progress Notes (Signed)
BH MD/PA/NP OP Progress Note  11/26/2021 12:26 PM Isaiah Black  MRN:  [id number]  Chief Complaint: "I am doing just fine"  HPI: 50 old male seen today for follow-up psychiatric evaluation.  He has a psychiatric history of paranoid schizophrenia, anxiety, and disassociation disorder.  He is currently being managed on Buspar 30 mg twice daily, Mirtazapine 7.5 mg nightly, hydroxyzine 50 mg three times daily as needed, and Abilify 400 mg monthly injection. Patient reported that his medications are effective in managing his psychiatric condition.   Today he is fairly groomed, pleasant, cooperative, engaged in conversation, maintained eye contact.  He informed provider that he is doing just fine.  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  Patient informed writer that his mood is stable and reports he has minimal anxiety or depression.  Today provider conducted a GAD-7 and patient scored a 10.  Provider also conducted PHQ-9 and patient 13.  He endorses adequate sleep and appetite.  Patient notes that occasionally he hears auditory hallucinations.  He endorses hearing chatter but reports that he is able to cope with it.  He informed Probation officer that while in big crowds he feels paranoid but also reports that he can cope with this.  No medication changes made today.  Patient agreeable to continue medications as prescribed.  No other concerns at this time. Visit Diagnosis:    ICD-10-CM   1. Paranoid schizophrenia ([place])  F20.0 ARIPiprazole ER (ABILIFY MAINTENA) 400 MG PRSY prefilled syringe    2. Generalized anxiety disorder  F41.1 busPIRone (BUSPAR) 15 MG tablet    hydrOXYzine (ATARAX) 50 MG tablet    mirtazapine (REMERON) 7.5 MG tablet    3. Mild depression  F32.A busPIRone (BUSPAR) 15 MG tablet    mirtazapine (REMERON) 7.5 MG tablet    4. Tardive dyskinesia  G24.01 valbenazine (INGREZZA) 40 MG capsule      Past Psychiatric History: paranoid schizophrenia, anxiety, and  disassociation disorder.   Past Medical History:  Past Medical History:  Diagnosis Date   Anxiety    Disassociation disorder    Paranoid schizophrenia ([place])    History reviewed. No pertinent surgical history.  Family Psychiatric History: Paternal uncle schizophrenia, Father Bipolar 1, Maternal aunt Bipolar 1, Niece depression  Family History: History reviewed. No pertinent family history.  Social History:  Social History   Socioeconomic History   Marital status: Single    Spouse name: Not on file   Number of children: 1   Years of education: Not on file   Highest education level: Not on file  Occupational History   Not on file  Tobacco Use   Smoking status: Former   Smokeless tobacco: Never  Vaping Use   Vaping Use: Former  Substance and Sexual Activity   Alcohol use: Not Currently    Comment: occ   Drug use: Not Currently   Sexual activity: Not Currently  Other Topics Concern   Not on file  Social History Narrative   Not on file   Social Determinants of Health   Financial Resource Strain: Low Risk  (08/06/2020)   Overall Financial Resource Strain (CARDIA)    Difficulty of Paying Living Expenses: Not hard at all  Food Insecurity: No Food Insecurity (04/14/2021)   Hunger Vital Sign    Worried About Running Out of Food in the Last Year: Never true    [place] in the Last Year: Never true  Transportation Needs: No Transportation Needs (04/14/2021)  PRAPARE - Hydrologist (Medical): No    Lack of Transportation (Non-Medical): No  Physical Activity: Sufficiently Active (09/03/2020)   Exercise Vital Sign    Days of Exercise per Week: 7 days    Minutes of Exercise per Session: 60 min  Recent Concern: Physical Activity - Insufficiently Active (08/06/2020)   Exercise Vital Sign    Days of Exercise per Week: 2 days    Minutes of Exercise per Session: 40 min  Stress: No Stress Concern Present (08/06/2020)   [place]    Feeling of Stress : Only a little  Social Connections: Socially Isolated (11/05/2020)   Social Connection and Isolation Panel [NHANES]    Frequency of Communication with Friends and Family: More than three times a week    Frequency of Social Gatherings with Friends and Family: Twice a week    Attends Religious Services: Never    Marine scientist or Organizations: No    Attends Archivist Meetings: Never    Marital Status: Never married    Allergies:  Allergies  Allergen Reactions   Cogentin [Benztropine]     Metabolic Disorder Labs: Lab Results  Component Value Date   HGBA1C 5.4 06/17/2021   MPG 85.32 03/07/2019   MPG 99.67 07/08/2017   Lab Results  Component Value Date   PROLACTIN 8.6 06/17/2021   PROLACTIN 100.0 (H) 03/07/2019   Lab Results  Component Value Date   CHOL 183 06/17/2021   TRIG 152 (H) 06/17/2021   HDL 49 06/17/2021   CHOLHDL 3.7 06/17/2021   VLDL 10 03/07/2019   LDLCALC 107 (H) 06/17/2021   LDLCALC 80 03/07/2019   Lab Results  Component Value Date   TSH 6.510 (H) 06/17/2021   TSH 5.024 (H) 03/07/2019    Therapeutic Level Labs: No results found for: "LITHIUM" No results found for: "VALPROATE" No results found for: "CBMZ"  Current Medications: Current Outpatient Medications  Medication Sig Dispense Refill   albuterol (VENTOLIN HFA) 108 (90 Base) MCG/ACT inhaler Inhale 2 puffs into the lungs every 6 (six) hours as needed for wheezing or shortness of breath. 18 each 3   ARIPiprazole ER (ABILIFY MAINTENA) 400 MG PRSY prefilled syringe INJECT EVERY 28 TO 30 DAYS 1 each 10   betamethasone valerate ointment (VALISONE) 0.1 % Apply 1 Application topically 2 (two) times daily. 45 g 3   budesonide-formoterol (SYMBICORT) 160-4.5 MCG/ACT inhaler Inhale 2 puffs into the lungs 2 (two) times daily. 3 each 4   busPIRone (BUSPAR) 15 MG tablet Take 2 tablets (30 mg total) by mouth 2 (two) times daily.  60 tablet 3   hydrOXYzine (ATARAX) 50 MG tablet TAKE 1 TABLET(50 MG) BY MOUTH THREE TIMES DAILY AS NEEDED 90 tablet 3   mirtazapine (REMERON) 7.5 MG tablet TAKE 1 TABLET(7.5 MG) BY MOUTH AT BEDTIME 30 tablet 3   montelukast (SINGULAIR) 10 MG tablet Take 1 tablet (10 mg total) by mouth at bedtime. 30 tablet 3   triamcinolone cream (KENALOG) 0.1 % Apply 1 application. topically 2 (two) times daily. 45 g 1   valbenazine (INGREZZA) 40 MG capsule Take 1 capsule (40 mg total) by mouth daily. 90 capsule 3   Current Facility-Administered Medications  Medication Dose Route Frequency Provider Last Rate Last Admin   ARIPiprazole ER (ABILIFY MAINTENA) 400 MG prefilled syringe 400 mg  400 mg Intramuscular Q28 days Lucky Rathke, FNP   400 mg at 10/29/21 [phone]  Musculoskeletal: Strength & Muscle Tone: within normal limits Gait & Station: normal Patient leans: N/A  Psychiatric Specialty Exam: Review of Systems  There were no vitals taken for this visit.There is no height or weight on file to calculate BMI.  General Appearance: Well Groomed  Eye Contact:  Good  Speech:  Clear and Coherent and Normal Rate  Volume:  Normal  Mood:  Euthymic  Affect:  Appropriate and Congruent  Thought Process:  Coherent, Goal Directed, and Linear  Orientation:  Full (Time, Place, and Person)  Thought Content: Logical, Hallucinations: Auditory, and Paranoid Ideation   Suicidal Thoughts:  No  Homicidal Thoughts:  No  Memory:  Immediate;   Good Recent;   Good Remote;   Good  Judgement:  Good  Insight:  Good  Psychomotor Activity:  Normal  Concentration:  Concentration: Good and Attention Span: Good  Recall:  Good  Fund of Knowledge: Good  Language: Good  Akathisia:  No  Handed:  Right  AIMS (if indicated): done  Assets:  Communication Skills Desire for Improvement Financial Resources/Insurance Housing Leisure Time Physical Health Social Support  ADL's:  Intact  Cognition: WNL  Sleep:  Good    Screenings: [place] Office Visit from 11/26/2021 in [facility] Office Visit from 08/06/2021 in [facility] Office Visit from 05/12/2021 in [place] from 10/31/2019 in [facility] Admission (Discharged) from 07/06/2017 in [place] [facility]  AIMS Total Score 0 4 0 5 0      AUDIT    Flowsheet Row Admission (Discharged) from 03/06/2019 in [place] Admission (Discharged) from 07/06/2017 in [place] [facility]  Alcohol Use Disorder Identification Test Final Score (AUDIT) 25 2      CAGE-AID    Flowsheet Row Counselor from 07/03/2019 in [place] Score 0      GAD-7    [place] Office Visit from 11/26/2021 in [facility] Counselor from 11/24/2021 in [facility] Office Visit from 10/22/2021 in [place] Counselor from 10/20/2021 in [facility] Counselor from 09/15/2021 in [facility]  Total GAD-7 Score '10 9 6 8 10      '$ PHQ2-9    [place] Office Visit from 11/26/2021 in [facility] Counselor from 11/24/2021 in [facility] Office Visit from 10/22/2021 in [place] Counselor from 10/20/2021 in [facility] Counselor from 09/15/2021 in [facility]  PHQ-2 Total Score '3 2 2 2 3  '$ PHQ-9 Total Score '13 10 10 10 12      '$ [place] Office Visit from 11/26/2021 in [facility] Office Visit from 08/06/2021 in [facility] Office Visit from 01/20/2021 in [place] No Risk No Risk No Risk        Assessment and Plan: Patient endorses auditory hallucination and mild paranoia while in crowds.  He however reports that he is able to cope with this.  No medication changes made today.  Patient agreeable to continue medication as prescribed.   1. Paranoid schizophrenia ([place])  Continue- ARIPiprazole ER (ABILIFY MAINTENA) 400 MG PRSY prefilled syringe; INJECT EVERY 28 TO 30 DAYS  Dispense: 1  each; Refill: 10  2. Generalized anxiety disorder  Continue- busPIRone (BUSPAR) 15 MG tablet; Take 2 tablets (30 mg total) by mouth 2 (two) times daily.  Dispense: 60 tablet; Refill: 3 Continue- hydrOXYzine (ATARAX) 50 MG tablet; TAKE 1 TABLET(50 MG) BY MOUTH THREE TIMES DAILY AS NEEDED  Dispense: 90 tablet; Refill: 3 Continue- mirtazapine (REMERON) 7.5 MG tablet; TAKE 1 TABLET(7.5 MG) BY MOUTH AT BEDTIME  Dispense: 30 tablet; Refill: 3  3. Mild depression  Continue- busPIRone (BUSPAR) 15 MG tablet; Take 2 tablets (30 mg total) by mouth 2 (two) times daily.  Dispense: 60 tablet; Refill: 3 Continue- mirtazapine (REMERON) 7.5 MG tablet; TAKE 1 TABLET(7.5 MG) BY MOUTH AT BEDTIME  Dispense: 30 tablet; Refill: 3  4. Tardive dyskinesia  Continue- valbenazine (INGREZZA) 40 MG capsule; Take 1 capsule (40 mg total) by mouth daily.  Dispense: 90 capsule; Refill: 3  Collaboration of Care: Collaboration of Care: Other provider involved in patient's care AEB PCP  Patient/Guardian was advised Release of Information must be obtained prior to any record release in order to collaborate their care with an outside provider. Patient/Guardian was advised if they have not already done so to contact the registration department to sign all necessary forms in order for [place] to release information regarding their care.   Consent: Patient/Guardian gives verbal consent for treatment and assignment of benefits for services provided during this visit. Patient/Guardian expressed  understanding and agreed to proceed.   Follow-up in 3 months Salley Slaughter, NP 11/26/2021, 12:26 PM

## 2021-11-26 NOTE — Progress Notes (Signed)
In late today for his abilify m 400 mg inj. Got his shot today in his L DELTOID. He had called to say he would be later today because pharmacy did not have his shot in yet. His mom did not come with him today as she teaches crafts on thrus am. He is doing well, has grown a beard and mustache which looks nice on him. He offers no complaints. He is aware this Probation officer is retiring and will not be seeing him after today. He was also seen today by Dr Ronne Binning for his three month assessment. He is to return in 28 days for his next shot and in three months to see the provider.

## 2021-12-17 ENCOUNTER — Encounter: Payer: Self-pay | Admitting: Gastroenterology

## 2021-12-22 ENCOUNTER — Ambulatory Visit (INDEPENDENT_AMBULATORY_CARE_PROVIDER_SITE_OTHER): Payer: Medicaid Other | Admitting: Licensed Clinical Social Worker

## 2021-12-22 ENCOUNTER — Ambulatory Visit (HOSPITAL_COMMUNITY): Payer: Medicaid Other

## 2021-12-22 ENCOUNTER — Encounter (HOSPITAL_COMMUNITY): Payer: Self-pay

## 2021-12-22 VITALS — BP 118/88 | HR 94 | Ht 65.0 in | Wt 158.2 lb

## 2021-12-22 DIAGNOSIS — F200 Paranoid schizophrenia: Secondary | ICD-10-CM

## 2021-12-22 DIAGNOSIS — F333 Major depressive disorder, recurrent, severe with psychotic symptoms: Secondary | ICD-10-CM

## 2021-12-22 DIAGNOSIS — F331 Major depressive disorder, recurrent, moderate: Secondary | ICD-10-CM

## 2021-12-22 DIAGNOSIS — F32A Depression, unspecified: Secondary | ICD-10-CM

## 2021-12-22 DIAGNOSIS — F411 Generalized anxiety disorder: Secondary | ICD-10-CM

## 2021-12-22 DIAGNOSIS — G2401 Drug induced subacute dyskinesia: Secondary | ICD-10-CM

## 2021-12-22 NOTE — Progress Notes (Cosign Needed)
Patient arrived with Mom for injection:  ARIPiprazole ER (ABILIFY MAINTENA) 400 MG    Patient Pleasant as Always . Was given in the right Deltoid

## 2021-12-22 NOTE — Plan of Care (Signed)
  Problem: Anxiety Disorder CCP Problem  1 GAD Goal:  walk 3 x weekly  Outcome: Progressing Goal: STG: Patient will complete at least 80% of assigned homework Outcome: Progressing Goal: STG: Patient will practice problem solving skills 3 times per week for the next 4 weeks Outcome: Progressing   Problem: Anxiety Disorder CCP Problem  1 GAD Goal: Find independent housing  Outcome: Not Progressing Goal: Decrease nightmare to 2/7 days weekly  Outcome: Not Progressing Goal: LTG: Patient will score less than 5 on the Generalized Anxiety Disorder 7 Scale (GAD-7) Outcome: Not Progressing Note: Pt score stayed the same from last visit    Problem: Anxiety Disorder CCP Problem  1 GAD Goal: STG: Report a decrease in anxiety symptoms as evidenced by an overall reduction in anxiety score by a minimum of 25% on the Generalized Anxiety Disorder Scale Outcome: Completed/Met

## 2021-12-22 NOTE — Progress Notes (Signed)
THERAPIST PROGRESS NOTE  Virtual Visit via Telephone Note  I connected with Wanda Moreland on 12/22/21 at  8:00 AM EST by telephone and verified that I am speaking with the correct person using two identifiers.    Location: Patient: [facility]  Provider: Provider Home    I discussed the limitations, risks, security and privacy concerns of performing an evaluation and management service by telephone and the availability of in person appointments. I also discussed with the patient that there may be a patient responsible charge related to this service. The patient expressed understanding and agreed to proceed.  I discussed the assessment and treatment plan with the patient. The patient was provided an opportunity to ask questions and all were answered. The patient agreed with the plan and demonstrated an understanding of the instructions.   The patient was advised to call back or seek an in-person evaluation if the symptoms worsen or if the condition fails to improve as anticipated.  I provided 20 minutes of non-face-to-face time during this encounter.  Dory Horn, LCSW   Participation Level: Active  Behavioral Response: CasualAlertAnxious and Depressed  Type of Therapy: Individual Therapy  Treatment Goals addressed: Problem: Anxiety Disorder CCP Problem  1 GAD Goal:  walk 3 x weekly  Outcome: Progressing   ProgressTowards Goals: Progressing  Interventions: Motivational Interviewing and Supportive  Summary: Lotus Gover is a 46 y.o. male who presents with anxious mood.  Patient was pleasant, cooperative, maintained good eye contact.  He engaged well in therapy session was dressed casually.  Patient was alert and oriented x5.  I[place]na reports that primary stressors today are medication management, illness, and family conflict.  Patient reports that he was notified of an appointment today at 11:30 AM at [facility] that he was not aware of.   LCSW looked into this using solution focused therapy.  LCSW notified patient that he does have a shot scheduled today for 1130.  Patient reports that he has not picked up his medication.  LCSW advised patient that due to holidays being closed on Thursday and Friday to pick up medication and try to make his appointment.  If he cannot make his appointments to call and reschedule if available for tomorrow 12/23/2021.  Other stressors for patient are illness.  Patient reports that he has a colonoscopy scheduled for 13 December.  Patient reports tension and worry.  Patient reports that this is due to his mother having a history of colon cancer.  LCSW utilized supportive therapy for praise and encouragement.  Other stressors for patient or family conflict as he still has not been unable to see in person his daughter [name] who lives in [place].  Patient reports that he still is able to see her via FaceTime.  Patient reports plan for this is to plan a trip to [place] in 2024.    Suicidal/Homicidal: Nowithout intent/plan  Intervention: LCSW administered a GAD-7.  LCSW administered the PHQ-9.  LCSW reviewed scores with patient.  LCSW notes no increase or decrease in GAD-7.  LCSW notes a significant decrease in PHQ-9.  Patient has had goal of being below a 10 for PHQ-9.  Plan: Return again in 3 weeks.  Diagnosis: Paranoid schizophrenia ([place])  Generalized anxiety disorder  Collaboration of Care: Other None today   Patient/Guardian was advised Release of Information must be obtained prior to any record release in order to collaborate their care with an outside provider. Patient/Guardian was advised if they have not already  done so to contact the registration department to sign all necessary forms in order for [place] to release information regarding their care.   Consent: Patient/Guardian gives verbal consent for treatment and assignment of benefits for services provided during this visit. Patient/Guardian  expressed understanding and agreed to proceed.   Dory Horn, LCSW 12/22/2021

## 2022-01-12 ENCOUNTER — Ambulatory Visit (INDEPENDENT_AMBULATORY_CARE_PROVIDER_SITE_OTHER): Payer: Medicaid Other | Admitting: Licensed Clinical Social Worker

## 2022-01-12 ENCOUNTER — Encounter (HOSPITAL_COMMUNITY): Payer: Self-pay | Admitting: Licensed Clinical Social Worker

## 2022-01-12 DIAGNOSIS — F333 Major depressive disorder, recurrent, severe with psychotic symptoms: Secondary | ICD-10-CM | POA: Diagnosis not present

## 2022-01-12 DIAGNOSIS — F200 Paranoid schizophrenia: Secondary | ICD-10-CM

## 2022-01-12 DIAGNOSIS — F411 Generalized anxiety disorder: Secondary | ICD-10-CM | POA: Diagnosis not present

## 2022-01-12 NOTE — Progress Notes (Signed)
   THERAPIST PROGRESS NOTE  Session Time: 30   Participation Level: Active  Behavioral Response: CasualAlertEuthymic  Type of Therapy: Individual Therapy  Treatment Goals addressed:    Problem: Anxiety Disorder CCP Problem  1 GAD Goal:  walk 3 x weekly  Outcome: Progressing    ProgressTowards Goals: Progressing  Interventions: Motivational Interviewing and Supportive  Summary: Isaiah Black is a 46 y.o. male who presents with euthymic mood\affect.  Isaiah Black was pleasant, cooperative, maintained good eye contact.  Isaiah Black engaged well in therapy session was dressed casually.  Isaiah Black was alert and oriented x 5.  Reports primary stressors as illness.  Isaiah Black reports that Isaiah Black needs to do a preventative colonoscopy tomorrow January 13, 2022.  Isaiah Black reports that Isaiah Black is nervous as Isaiah Black has a family for history of colon cancer.  Isaiah Black reports that all Isaiah Black labs were normal but states that they could still find something.  Other stressors for Isaiah Black is getting Isaiah Black daughter from Isaiah Black surgery to [place] for a visit.  Isaiah Black reports that it has been a struggle to see Isaiah Black daughter [name] on anything but FaceTime over the past 2 years.  Isaiah Black states that Isaiah Black is working with a family friend that may be able to help them get up to [place] and take [name] to [place] for the summer.  Jalik reports that Isaiah Black has already talked to Isaiah Black daughter's mother about this and she was agreeable.  States that Isaiah Black has 1.5 years left on Isaiah Black probation and ankle monitoring.  Gamble states that Isaiah Black goes to [employer] 2 times weekly.  Isaiah Black reports walking at least 3 times weekly.  Suicidal/Homicidal: Nowithout intent/plan  Therapist Response:     Intervention/Plan: LCSW updated progress on treatment plan.  Isaiah Black is progressing on walking 3 times weekly.  Isaiah Black reports walking at least 4-5 times weekly.  Isaiah Black is also decreasing nightmares stating no nightmares this past month goal\objective was  nightmares less than 2 days/week.  Isaiah Black reports quality of sleep has improved.  LCSW provided interventions for supportive therapy, CBT therapy, reframing, and motivational interviewing.  Direct interventions utilized for open-ended questions, reflective listening, praise, encouragement, cognitive restructuring, and positive affirmations.  LCSW administered the GAD-7.  LCSW administered a PHQ-9.  LCSW reviewed scores with Isaiah Black.    Plan: Return again in 3 weeks.  Diagnosis: Paranoid schizophrenia ([place])  Severe episode of recurrent major depressive disorder, with psychotic features ([place])  Generalized anxiety disorder  Collaboration of Care: Other None today   Isaiah Black/Guardian was advised Release of Information must be obtained prior to any record release in order to collaborate their care with an outside provider. Isaiah Black/Guardian was advised if they have not already done so to contact the registration department to sign all necessary forms in order for [place] to release information regarding their care.   Consent: Isaiah Black/Guardian gives verbal consent for treatment and assignment of benefits for services provided during this visit. Isaiah Black/Guardian expressed understanding and agreed to proceed.   Dory Horn, LCSW 01/12/2022

## 2022-01-13 ENCOUNTER — Ambulatory Visit (AMBULATORY_SURGERY_CENTER): Payer: Medicaid Other | Admitting: *Deleted

## 2022-01-13 VITALS — Ht 65.5 in | Wt 161.8 lb

## 2022-01-13 DIAGNOSIS — Z800 Family history of malignant neoplasm of digestive organs: Secondary | ICD-10-CM

## 2022-01-13 DIAGNOSIS — Z1211 Encounter for screening for malignant neoplasm of colon: Secondary | ICD-10-CM

## 2022-01-13 MED ORDER — PLENVU 140 G PO SOLR: 1.0000 | Freq: Once | ORAL | 0 refills | Status: AC

## 2022-01-13 NOTE — Progress Notes (Signed)
No egg or soy allergy known to patient  No issues known to pt with past sedation with any surgeries or procedures Patient denies ever being told they had issues or difficulty with intubation  No FH of Malignant Hyperthermia Pt is not on diet pills Pt is not on  home 02  Pt is not on blood thinners  Pt denies issues with constipation  No A fib or A flutter Have any cardiac testing pending--no Pt instructed to use [email or web address] or GoodRx for a price reduction on prep   

## 2022-01-19 ENCOUNTER — Ambulatory Visit (HOSPITAL_COMMUNITY): Payer: Medicaid Other

## 2022-01-20 ENCOUNTER — Encounter (HOSPITAL_COMMUNITY): Payer: Self-pay

## 2022-01-20 ENCOUNTER — Ambulatory Visit (INDEPENDENT_AMBULATORY_CARE_PROVIDER_SITE_OTHER): Payer: Medicaid Other | Admitting: Psychiatry

## 2022-01-20 VITALS — BP 141/99 | HR 68 | Resp 18 | Ht 65.0 in | Wt 157.0 lb

## 2022-01-20 DIAGNOSIS — F200 Paranoid schizophrenia: Secondary | ICD-10-CM | POA: Diagnosis not present

## 2022-01-20 NOTE — Progress Notes (Signed)
BH MD/PA/NP OP Progress Note  01/20/2022 2:18 PM [place] Day  MRN:  [id number]  Chief Complaint: "I am doing well"  HPI: 59 old male seen today for follow-up psychiatric evaluation.  He has a psychiatric history of paranoid schizophrenia, anxiety, and disassociation disorder.  He is currently being managed on Buspar 30 mg twice daily, Mirtazapine 7.5 mg nightly, hydroxyzine 50 mg three times daily as needed, and Abilify 400 mg monthly injection. Patient reported that his medications are effective in managing his psychiatric condition.   Pt reports that his mood is " good". He denies feeling depressed. He has been sleeping and eating well .  Currently, He denies any suicidal ideations, homicidal ideations. He reports hearing chatters and seeing shadows sometimes but it doesn't bother him much. He denies any medication side effects and has been tolerating it well. He does have mild tremors in both hands. He reports improvement of tremors with Ingrezza.  He reports that he has been on antipsychotics since 2011.  He tried risperidone in the past and currently on Abilify.  He reports that Abilify has been working well for him.  He reports no change in his current stressors.      Patient denies any need for change in medication and medication dosages and wants to continue same meds.  Discussed high TSH level which was last done in May 2023.  He reports  history of hypothyroidism and was taking thyroid medications long time ago but it was stopped by PCP in 2018/19 as his thyroid was normal.  Encouraged him to talk to PCP again regarding high TSH.     Discussed that his vitamin D level was also low so he should take OTC vitamin D 1000 mg daily.  He agrees with the plan and denies any other concerns. Patient is alert and oriented x 4,  calm, cooperative, and fully engaged in conversation during the encounter.  His thought process is coherent with coherent speech . He does not appear to be responding to  internal/external stimuli .    No medication changes made today.  Patient agreeable to continue medications as prescribed.  No other concerns at this time. On Exam mild resting tremors in B/L upper extremities Visit Diagnosis:    ICD-10-CM   1. Paranoid schizophrenia ([place])  F20.0 CANCELED: T3    CANCELED: T4      Past Psychiatric History: paranoid schizophrenia, anxiety, and disassociation disorder.   Past Medical History:  Past Medical History:  Diagnosis Date   Anxiety    Asthma    Depression    Disassociation disorder    Paranoid schizophrenia ([place])    History reviewed. No pertinent surgical history.  Family Psychiatric History: Paternal uncle schizophrenia, Father Bipolar 1, Maternal aunt Bipolar 1, Niece depression  Family History:  Family History  Problem Relation Age of Onset   Colon cancer Mother        mid 6s   Stomach cancer Neg Hx    Esophageal cancer Neg Hx     Social History:  Social History   Socioeconomic History   Marital status: Single    Spouse name: Not on file   Number of children: 1   Years of education: Not on file   Highest education level: Not on file  Occupational History   Not on file  Tobacco Use   Smoking status: Former   Smokeless tobacco: Never  Vaping Use   Vaping Use: Former  Substance and Sexual Activity   Alcohol use:  Not Currently    Comment: occ   Drug use: Not Currently   Sexual activity: Not Currently  Other Topics Concern   Not on file  Social History Narrative   Not on file   Social Determinants of Health   Financial Resource Strain: Low Risk  (08/06/2020)   Overall Financial Resource Strain (CARDIA)    Difficulty of Paying Living Expenses: Not hard at all  Food Insecurity: No Food Insecurity (04/14/2021)   Hunger Vital Sign    Worried About Running Out of Food in the Last Year: Never true    Ran Out of Food in the Last Year: Never true  Transportation Needs: No Transportation Needs (04/14/2021)   PRAPARE -  Hydrologist (Medical): No    Lack of Transportation (Non-Medical): No  Physical Activity: Sufficiently Active (09/03/2020)   Exercise Vital Sign    Days of Exercise per Week: 7 days    Minutes of Exercise per Session: 60 min  Recent Concern: Physical Activity - Insufficiently Active (08/06/2020)   Exercise Vital Sign    Days of Exercise per Week: 2 days    Minutes of Exercise per Session: 40 min  Stress: No Stress Concern Present (08/06/2020)   [place]    Feeling of Stress : Only a little  Social Connections: Moderately Isolated (01/12/2022)   Social Connection and Isolation Panel [NHANES]    Frequency of Communication with Friends and Family: More than three times a week    Frequency of Social Gatherings with Friends and Family: Once a week    Attends Religious Services: Never    Marine scientist or Organizations: Yes    Attends Music therapist: More than 4 times per year    Marital Status: Never married    Allergies:  Allergies  Allergen Reactions   Cogentin [Benztropine]     Metabolic Disorder Labs: Lab Results  Component Value Date   HGBA1C 5.4 06/17/2021   MPG 85.32 03/07/2019   MPG 99.67 07/08/2017   Lab Results  Component Value Date   PROLACTIN 8.6 06/17/2021   PROLACTIN 100.0 (H) 03/07/2019   Lab Results  Component Value Date   CHOL 183 06/17/2021   TRIG 152 (H) 06/17/2021   HDL 49 06/17/2021   CHOLHDL 3.7 06/17/2021   VLDL 10 03/07/2019   LDLCALC 107 (H) 06/17/2021   LDLCALC 80 03/07/2019   Lab Results  Component Value Date   TSH 6.510 (H) 06/17/2021   TSH 5.024 (H) 03/07/2019    Therapeutic Level Labs: No results found for: "LITHIUM" No results found for: "VALPROATE" No results found for: "CBMZ"  Current Medications: Current Outpatient Medications  Medication Sig Dispense Refill   albuterol (VENTOLIN HFA) 108 (90 Base) MCG/ACT  inhaler Inhale 2 puffs into the lungs every 6 (six) hours as needed for wheezing or shortness of breath. 18 each 3   ARIPiprazole ER (ABILIFY MAINTENA) 400 MG PRSY prefilled syringe INJECT EVERY 28 TO 30 DAYS 1 each 10   betamethasone valerate ointment (VALISONE) 0.1 % Apply 1 Application topically 2 (two) times daily. 45 g 3   budesonide-formoterol (SYMBICORT) 160-4.5 MCG/ACT inhaler Inhale 2 puffs into the lungs 2 (two) times daily. 3 each 4   busPIRone (BUSPAR) 15 MG tablet Take 2 tablets (30 mg total) by mouth 2 (two) times daily. 60 tablet 3   hydrOXYzine (ATARAX) 50 MG tablet TAKE 1 TABLET(50 MG) BY MOUTH THREE TIMES  DAILY AS NEEDED 90 tablet 3   mirtazapine (REMERON) 7.5 MG tablet TAKE 1 TABLET(7.5 MG) BY MOUTH AT BEDTIME 30 tablet 3   montelukast (SINGULAIR) 10 MG tablet Take 1 tablet (10 mg total) by mouth at bedtime. 30 tablet 3   triamcinolone cream (KENALOG) 0.1 % Apply 1 application. topically 2 (two) times daily. 45 g 1   valbenazine (INGREZZA) 40 MG capsule Take 1 capsule (40 mg total) by mouth daily. 90 capsule 3   Current Facility-Administered Medications  Medication Dose Route Frequency Provider Last Rate Last Admin   ARIPiprazole ER (ABILIFY MAINTENA) 400 MG prefilled syringe 400 mg  400 mg Intramuscular Q28 days Lucky Rathke, FNP   400 mg at 01/20/22 1407     Musculoskeletal: Strength & Muscle Tone: within normal limits Gait & Station: normal Patient leans: N/A  Psychiatric Specialty Exam: Review of Systems  Blood pressure (!) 141/99, pulse 68, resp. rate 18, height '5\' 5"'$  (1.651 m), weight 157 lb (71.2 kg), SpO2 96 %.Body mass index is 26.13 kg/m.  General Appearance: Well Groomed  Eye Contact:  Good  Speech:  Clear and Coherent and Normal Rate  Volume:  Normal  Mood:  Euthymic  Affect:  Appropriate and Congruent  Thought Process:  Coherent, Goal Directed, and Linear  Orientation:  Full (Time, Place, and Person)  Thought Content: Logical and Hallucinations:  Auditory Visual   Suicidal Thoughts:  No  Homicidal Thoughts:  No  Memory:  Immediate;   Good Recent;   Good Remote;   Good  Judgement:  Good  Insight:  Good  Psychomotor Activity:  Normal  Concentration:  Concentration: Good and Attention Span: Good  Recall:  Good  Fund of Knowledge: Good  Language: Good  Akathisia:  No  Handed:  Right  AIMS (if indicated): done 0  Assets:  Communication Skills Desire for Improvement Financial Resources/Insurance Housing Leisure Time Physical Health Social Support  ADL's:  Intact  Cognition: WNL  Sleep:  Good   Screenings: [place] Office Visit from 11/26/2021 in [facility] Office Visit from 08/06/2021 in [facility] Office Visit from 05/12/2021 in [place] from 10/31/2019 in [facility] Admission (Discharged) from 07/06/2017 in [place] [facility]  AIMS Total Score 0 4 0 5 0      AUDIT    Flowsheet Row Admission (Discharged) from 03/06/2019 in [place] Admission (Discharged) from 07/06/2017 in [place] [facility]  Alcohol Use Disorder Identification Test Final Score (AUDIT) 25 2      CAGE-AID    Flowsheet Row Counselor from 07/03/2019 in [place] Score 0      GAD-7    Flowsheet Row Counselor from 01/12/2022 in [facility] Counselor from 12/22/2021 in [facility] Office Visit from 11/26/2021 in [facility] Counselor from 11/24/2021 in [facility] Office Visit from 10/22/2021 in [place]  Total GAD-7 Score '9 9 10 9 6      '$ PHQ2-9    Flowsheet Row Counselor from 01/12/2022 in [facility] Counselor from  12/22/2021 in [facility] Office Visit from 11/26/2021 in [facility] Counselor from 11/24/2021 in [facility] Office Visit from 10/22/2021 in [place]  CTR  PHQ-2 Total Score '2 1 3 2 2  '$ PHQ-9 Total Score '7 5 13 10 10      '$ Flowsheet Row Office Visit from 11/26/2021 in [facility] Office Visit from 08/06/2021 in [facility] Office Visit from 01/20/2021 in [place] No Risk No Risk No Risk        Assessment and Plan:  No medication changes made today.  Patient agreeable to continue medication as prescribed.  AIMS 0.  Mild resting tremors in bilateral upper extremity.  No involuntary movements. 1. Paranoid schizophrenia ([place])  Continue- ARIPiprazole ER (ABILIFY MAINTENA) 400 MG PRSY prefilled syringe; INJECT EVERY 28 TO 30 DAYS   2. Generalized anxiety disorder  Continue- busPIRone (BUSPAR) 15 MG tablet; Take 2 tablets (30 mg total) by mouth 2 (two) times daily.  Continue- hydrOXYzine (ATARAX) 50 MG tablet; TAKE 1 TABLET(50 MG) BY MOUTH THREE TIMES DAILY AS NEEDED   Continue- mirtazapine (REMERON) 7.5 MG tablet; TAKE 1 TABLET(7.5 MG) BY MOUTH AT BEDTIME   3. Mild depression  Continue- busPIRone (BUSPAR) 15 MG tablet; Take 2 tablets (30 mg total) by mouth 2 (two) times daily.   Continue- mirtazapine (REMERON) 7.5 MG tablet; TAKE 1 TABLET(7.5 MG) BY MOUTH AT BEDTIME    4. Tremors  Continue- valbenazine (INGREZZA) 40 MG capsule; Take 1 capsule (40 mg total) by mouth daily.  Currently, he reports improvement with Ingrezza but can consider switching to propranolol if tremors worsens.   Collaboration of Care: Collaboration of Care: Other provider involved in patient's care AEB PCP  Patient/Guardian was advised Release of Information must be obtained prior to any record release  in order to collaborate their care with an outside provider. Patient/Guardian was advised if they have not already done so to contact the registration department to sign all necessary forms in order for [place] to release information regarding their care.   Consent: Patient/Guardian gives verbal consent for treatment and assignment of benefits for services provided during this visit. Patient/Guardian expressed understanding and agreed to proceed.   Follow-up in 3 months Armando Reichert, MD 01/20/2022, 2:18 PM

## 2022-01-20 NOTE — Progress Notes (Signed)
Patient arrived with Mom for injection:  ARIPiprazole ER (ABILIFY MAINTENA) 400 MG    Patient Pleasant as Always . Was given in the left Deltoid

## 2022-01-20 NOTE — Progress Notes (Signed)
BP elevated, Dr. Rosita Kea made aware. Patient has no complaints or issues.

## 2022-01-21 ENCOUNTER — Ambulatory Visit (INDEPENDENT_AMBULATORY_CARE_PROVIDER_SITE_OTHER): Payer: Medicaid Other | Admitting: Primary Care

## 2022-01-21 ENCOUNTER — Encounter (INDEPENDENT_AMBULATORY_CARE_PROVIDER_SITE_OTHER): Payer: Self-pay | Admitting: Primary Care

## 2022-01-21 VITALS — BP 125/88 | HR 92 | Wt 158.4 lb

## 2022-01-21 DIAGNOSIS — R7989 Other specified abnormal findings of blood chemistry: Secondary | ICD-10-CM

## 2022-01-21 DIAGNOSIS — J4540 Moderate persistent asthma, uncomplicated: Secondary | ICD-10-CM | POA: Diagnosis not present

## 2022-01-21 DIAGNOSIS — Z131 Encounter for screening for diabetes mellitus: Secondary | ICD-10-CM

## 2022-01-21 DIAGNOSIS — Z1211 Encounter for screening for malignant neoplasm of colon: Secondary | ICD-10-CM | POA: Diagnosis not present

## 2022-01-21 NOTE — Progress Notes (Signed)
Discuss TSH results. Asthma; no concerns.

## 2022-01-21 NOTE — Progress Notes (Signed)
[place], is a 46 y.o. male  [phone]  CWC:[id number]  DOB - March 24, 1975  Chief Complaint  Patient presents with   Asthma       Subjective:   [name] is a 46 y.o. male here today for a follow up one labs that was brought to his attention from behavorial health his TSH was elevated and ordered by Yetta Flock, MD). Also , mother is checking his blood sugars 2-3 hours after eating ranging as high as 200. Mother ask patient to inform PCP.   Patient has No headache, No chest pain, No abdominal pain - No Nausea, No new weakness tingling or numbness, No Cough - shortness of breath  No problems updated.  Allergies  Allergen Reactions   Cogentin [Benztropine]     Past Medical History:  Diagnosis Date   Anxiety    Asthma    Depression    Disassociation disorder    Paranoid schizophrenia ([place])     Current Outpatient Medications on File Prior to Visit  Medication Sig Dispense Refill   albuterol (VENTOLIN HFA) 108 (90 Base) MCG/ACT inhaler Inhale 2 puffs into the lungs every 6 (six) hours as needed for wheezing or shortness of breath. 18 each 3   ARIPiprazole ER (ABILIFY MAINTENA) 400 MG PRSY prefilled syringe INJECT EVERY 28 TO 30 DAYS 1 each 10   betamethasone valerate ointment (VALISONE) 0.1 % Apply 1 Application topically 2 (two) times daily. 45 g 3   budesonide-formoterol (SYMBICORT) 160-4.5 MCG/ACT inhaler Inhale 2 puffs into the lungs 2 (two) times daily. 3 each 4   busPIRone (BUSPAR) 15 MG tablet Take 2 tablets (30 mg total) by mouth 2 (two) times daily. 60 tablet 3   hydrOXYzine (ATARAX) 50 MG tablet TAKE 1 TABLET(50 MG) BY MOUTH THREE TIMES DAILY AS NEEDED 90 tablet 3   montelukast (SINGULAIR) 10 MG tablet Take 1 tablet (10 mg total) by mouth at bedtime. 30 tablet 3   triamcinolone cream (KENALOG) 0.1 % Apply 1 application. topically 2 (two) times daily. 45 g 1   valbenazine (INGREZZA) 40 MG capsule Take 1 capsule (40  mg total) by mouth daily. 90 capsule 3   mirtazapine (REMERON) 7.5 MG tablet TAKE 1 TABLET(7.5 MG) BY MOUTH AT BEDTIME (Patient not taking: Reported on 01/21/2022) 30 tablet 3   Current Facility-Administered Medications on File Prior to Visit  Medication Dose Route Frequency Provider Last Rate Last Admin   ARIPiprazole ER (ABILIFY MAINTENA) 400 MG prefilled syringe 400 mg  400 mg Intramuscular Q28 days Lucky Rathke, FNP   400 mg at 01/20/22 1407    Objective:   Vitals:   01/21/22 0911  BP: 125/88  Pulse: 92  SpO2: 95%  Weight: 158 lb 6.4 oz (71.8 kg)    Exam General appearance : Awake, alert, not in any distress. Speech Clear. Not toxic looking HEENT: Atraumatic and Normocephalic, pupils equally reactive to light and accomodation Neck: Supple, no JVD. No cervical lymphadenopathy.  Chest: Good air entry bilaterally, no added sounds  CVS: S1 S2 regular, no murmurs.  Abdomen: Bowel sounds present, Non tender and not distended with no gaurding, rigidity or rebound. Extremities: B/L Lower Ext shows no edema, both legs are warm to touch Neurology: Awake alert, and oriented X 3, CN II-XII intact, Non focal Skin: No Rash  Data Review Lab Results  Component Value Date   HGBA1C 5.5 01/21/2022   HGBA1C 5.4 06/17/2021   HGBA1C 4.6 (L) 03/07/2019  Assessment & Plan  Isaiah Black was seen today for asthma.  Diagnoses and all orders for this visit:  Elevated TSH -     TSH + free T4 -     T3, Free  Colon cancer screening -     Cancel: Ambulatory referral to Gastroenterology Schedule January 2024   Screening for diabetes mellitus -     Hemoglobin A1c  Moderate persistent asthma without complication  He has prescribed LABA and Saba and Singulair for maintenance  Patient have been counseled extensively about nutrition and exercise. Other issues discussed during this visit include: low cholesterol diet, weight control and daily exercise, foot care, annual eye examinations at  Ophthalmology, importance of adherence with medications and regular follow-up. We also discussed long term complications of uncontrolled diabetes and hypertension.   Return if symptoms worsen or fail to improve.  The patient was given clear instructions to go to ER or return to medical center if symptoms don't improve, worsen or new problems develop. The patient verbalized understanding. The patient was told to call to get lab results if they haven't heard anything in the next week.   This note has been created with Surveyor, quantity. Any transcriptional errors are unintentional.   Kerin Perna, NP 01/26/2022, 11:12 PM

## 2022-01-22 LAB — HEMOGLOBIN A1C
Est. average glucose Bld gHb Est-mCnc: 111 mg/dL
Hgb A1c MFr Bld: 5.5 % (ref 4.8–5.6)

## 2022-01-22 LAB — T3, FREE: T3, Free: 3.2 pg/mL (ref 2.0–4.4)

## 2022-01-22 LAB — TSH+FREE T4
Free T4: 1.17 ng/dL (ref 0.82–1.77)
TSH: 2.16 u[IU]/mL (ref 0.450–4.500)

## 2022-02-09 ENCOUNTER — Encounter: Payer: Self-pay | Admitting: Certified Registered Nurse Anesthetist

## 2022-02-09 ENCOUNTER — Ambulatory Visit (INDEPENDENT_AMBULATORY_CARE_PROVIDER_SITE_OTHER): Payer: Medicaid Other | Admitting: Licensed Clinical Social Worker

## 2022-02-09 DIAGNOSIS — F200 Paranoid schizophrenia: Secondary | ICD-10-CM

## 2022-02-09 NOTE — Progress Notes (Signed)
THERAPIST PROGRESS NOTE  Virtual Visit via Video Note  I connected with Isaiah Black on 02/09/22 at  8:00 AM EST by a video enabled telemedicine application and verified that I am speaking with the correct person using two identifiers.  Location: Patient: [facility]  Provider: Provider Home    I discussed the limitations of evaluation and management by telemedicine and the availability of in person appointments. The patient expressed understanding and agreed to proceed.     I discussed the assessment and treatment plan with the patient. The patient was provided an opportunity to ask questions and all were answered. The patient agreed with the plan and demonstrated an understanding of the instructions.   The patient was advised to call back or seek an in-person evaluation if the symptoms worsen or if the condition fails to improve as anticipated.  I provided 30 minutes of non-face-to-face time during this encounter.   Isaiah Horn, LCSW   Participation Level: Active  Behavioral Response: CasualAlertAnxious and Depressed  Type of Therapy: Individual Therapy  Treatment Goals addressed:  Active     Anxiety Disorder CCP Problem  1 GAD      walk 3 x weekly  (Completed/Met)     Start:  03/24/21    Expected End:  04/01/22    Resolved:  01/12/22      Find independent housing  (Not Progressing)     Start:  03/24/21    Expected End:  04/01/22         Decrease nightmare to 2/7 days weekly  (Progressing)     Start:  03/24/21    Expected End:  04/01/22       Goal Note     3/7 days out of the week pt is having night terrors          LTG: Patient will score less than 5 on the Generalized Anxiety Disorder 7 Scale (GAD-7) (Not Progressing)     Start:  03/24/21    Expected End:  04/01/22       Goal Note     Decrease by 1 point from last session.          STG: Patient will complete at least 80% of assigned homework (Progressing)     Start:  03/24/21     Expected End:  04/01/22         STG: Patient will practice problem solving skills 3 times per week for the next 4 weeks (Progressing)     Start:  03/24/21    Expected End:  04/01/22         STG: Report a decrease in anxiety symptoms as evidenced by an overall reduction in anxiety score by a minimum of 25% on the Generalized Anxiety Disorder Scale (Completed/Met)     Start:  03/24/21    Expected End:  04/01/22    Resolved:  12/22/21         ProgressTowards Goals: Progressing  Interventions: CBT and Motivational Interviewing    Suicidal/Homicidal: Nowithout intent/plan  Therapist Response:     Patient was alert and oriented x 5.  He was pleasant, cooperative, maintained good eye contact.  He engaged well in therapy session and was dressed casually.  Isaiah Black was alert and oriented x 5.  Patient comes in today stating everything has been going "well".  He reports that all medications are working well with no auditory or visual hallucinations reported at this time.  Patient reports no SI\HI.  LCSW administered today GAD-7.  LCSW administered today PHQ-9.  LCSW notes a decrease in both PHQ-9 and GAD-7 as noted in treatment plan above.  LCSW reviewed scores with patient.  LCSW notes a decrease in night terrors.  As evidenced by self-reported decreasing night terrors to 3 out of the 7 days weekly.  LCSW utilized supportive therapy for praise and encouragement.  LCSW utilized open-ended questions and positive affirmations for motivational interviewing.   Plan: Return again in 3 weeks.  Diagnosis: No diagnosis found.  Collaboration of Care: Other None today   Patient/Guardian was advised Release of Information must be obtained prior to any record release in order to collaborate their care with an outside provider. Patient/Guardian was advised if they have not already done so to contact the registration department to sign all necessary forms in order for [place] to release information regarding  their care.   Consent: Patient/Guardian gives verbal consent for treatment and assignment of benefits for services provided during this visit. Patient/Guardian expressed understanding and agreed to proceed.   Isaiah Horn, LCSW 02/09/2022

## 2022-02-10 ENCOUNTER — Encounter: Payer: Self-pay | Admitting: Gastroenterology

## 2022-02-10 ENCOUNTER — Ambulatory Visit (AMBULATORY_SURGERY_CENTER): Payer: Medicaid Other | Admitting: Gastroenterology

## 2022-02-10 VITALS — BP 106/73 | HR 80 | Temp 98.0°F | Resp 20 | Ht 65.5 in | Wt 161.8 lb

## 2022-02-10 DIAGNOSIS — D125 Benign neoplasm of sigmoid colon: Secondary | ICD-10-CM

## 2022-02-10 DIAGNOSIS — D120 Benign neoplasm of cecum: Secondary | ICD-10-CM

## 2022-02-10 DIAGNOSIS — Z1211 Encounter for screening for malignant neoplasm of colon: Secondary | ICD-10-CM

## 2022-02-10 DIAGNOSIS — Z800 Family history of malignant neoplasm of digestive organs: Secondary | ICD-10-CM | POA: Diagnosis not present

## 2022-02-10 DIAGNOSIS — D123 Benign neoplasm of transverse colon: Secondary | ICD-10-CM | POA: Diagnosis not present

## 2022-02-10 MED ORDER — SODIUM CHLORIDE 0.9 % IV SOLN: 500.0000 mL | Freq: Once | INTRAVENOUS | Status: AC

## 2022-02-10 NOTE — Progress Notes (Signed)
Report given to PACU, vss 

## 2022-02-10 NOTE — Progress Notes (Signed)
[facility] History and Physical   Primary Care Physician:  Kerin Perna, NP   Reason for Procedure:   Family history of colon cancer  Plan:    colonoscopy     HPI: Isaiah Black is a 47 y.o. male  here for colonoscopy screening - first time exam, mother has had colon cancer dx age 66s. Sister had colon polyps  Patient denies any bowel symptoms at this time. Otherwise feels well without any cardiopulmonary symptoms.   I have discussed risks / benefits of anesthesia and endoscopic procedure with [name] and they wish to proceed with the exams as outlined today.    Past Medical History:  Diagnosis Date   Anxiety    Asthma    Depression    Disassociation disorder    Paranoid schizophrenia ([place])     History reviewed. No pertinent surgical history.  Prior to Admission medications   Medication Sig Start Date End Date Taking? Authorizing Provider  albuterol (VENTOLIN HFA) 108 (90 Base) MCG/ACT inhaler Inhale 2 puffs into the lungs every 6 (six) hours as needed for wheezing or shortness of breath. 10/22/21  Yes Kerin Perna, NP  betamethasone valerate ointment (VALISONE) 0.1 % Apply 1 Application topically 2 (two) times daily. 10/22/21  Yes Kerin Perna, NP  budesonide-formoterol (SYMBICORT) 160-4.5 MCG/ACT inhaler Inhale 2 puffs into the lungs 2 (two) times daily. 10/22/21  Yes Kerin Perna, NP  busPIRone (BUSPAR) 15 MG tablet Take 2 tablets (30 mg total) by mouth 2 (two) times daily. 11/26/21  Yes Eulis Canner E, NP  hydrOXYzine (ATARAX) 50 MG tablet TAKE 1 TABLET(50 MG) BY MOUTH THREE TIMES DAILY AS NEEDED 11/26/21  Yes Eulis Canner E, NP  montelukast (SINGULAIR) 10 MG tablet Take 1 tablet (10 mg total) by mouth at bedtime. 10/22/21  Yes Kerin Perna, NP  ARIPiprazole ER (ABILIFY MAINTENA) 400 MG PRSY prefilled syringe INJECT EVERY 28 TO 30 DAYS 11/26/21   Eulis Canner E, NP  mirtazapine (REMERON) 7.5 MG tablet TAKE 1  TABLET(7.5 MG) BY MOUTH AT BEDTIME Patient not taking: Reported on 01/21/2022 11/26/21   Salley Slaughter, NP  valbenazine [facility]) 40 MG capsule Take 1 capsule (40 mg total) by mouth daily. 11/26/21   Salley Slaughter, NP    Current Outpatient Medications  Medication Sig Dispense Refill   albuterol (VENTOLIN HFA) 108 (90 Base) MCG/ACT inhaler Inhale 2 puffs into the lungs every 6 (six) hours as needed for wheezing or shortness of breath. 18 each 3   betamethasone valerate ointment (VALISONE) 0.1 % Apply 1 Application topically 2 (two) times daily. 45 g 3   budesonide-formoterol (SYMBICORT) 160-4.5 MCG/ACT inhaler Inhale 2 puffs into the lungs 2 (two) times daily. 3 each 4   busPIRone (BUSPAR) 15 MG tablet Take 2 tablets (30 mg total) by mouth 2 (two) times daily. 60 tablet 3   hydrOXYzine (ATARAX) 50 MG tablet TAKE 1 TABLET(50 MG) BY MOUTH THREE TIMES DAILY AS NEEDED 90 tablet 3   montelukast (SINGULAIR) 10 MG tablet Take 1 tablet (10 mg total) by mouth at bedtime. 30 tablet 3   ARIPiprazole ER (ABILIFY MAINTENA) 400 MG PRSY prefilled syringe INJECT EVERY 28 TO 30 DAYS 1 each 10   mirtazapine (REMERON) 7.5 MG tablet TAKE 1 TABLET(7.5 MG) BY MOUTH AT BEDTIME (Patient not taking: Reported on 01/21/2022) 30 tablet 3   valbenazine (INGREZZA) 40 MG capsule Take 1 capsule (40 mg total) by mouth daily. 90 capsule 3   Current  Facility-Administered Medications  Medication Dose Route Frequency Provider Last Rate Last Admin   0.9 %  sodium chloride infusion  500 mL Intravenous Once Raelynn Corron, Carlota Raspberry, MD       0.9 %  sodium chloride infusion  500 mL Intravenous Once Gasper Hopes, Carlota Raspberry, MD       ARIPiprazole ER (ABILIFY MAINTENA) 400 MG prefilled syringe 400 mg  400 mg Intramuscular Q28 days Lucky Rathke, FNP   400 mg at 01/20/22 1407    Allergies as of 02/10/2022 - Review Complete 02/10/2022  Allergen Reaction Noted   Cogentin [benztropine]  07/24/2020    Family History  Problem  Relation Age of Onset   Colon cancer Mother        mid 44s   Diabetes Mother    Diabetes Maternal Aunt    Diabetes Maternal Uncle    Stomach cancer Neg Hx    Esophageal cancer Neg Hx     Social History   Socioeconomic History   Marital status: Single    Spouse name: Not on file   Number of children: 1   Years of education: Not on file   Highest education level: Not on file  Occupational History   Not on file  Tobacco Use   Smoking status: Former   Smokeless tobacco: Never  Vaping Use   Vaping Use: Former  Substance and Sexual Activity   Alcohol use: Not Currently    Comment: occ   Drug use: Not Currently   Sexual activity: Not Currently  Other Topics Concern   Not on file  Social History Narrative   Not on file   Social Determinants of Health   Financial Resource Strain: [place]  (08/06/2020)   Overall Financial Resource Strain (CARDIA)    Difficulty of Paying Living Expenses: Not hard at all  Food Insecurity: No Food Insecurity (04/14/2021)   Hunger Vital Sign    Worried About Running Out of Food in the Last Year: Never true    [place] in the Last Year: Never true  Transportation Needs: No Transportation Needs (04/14/2021)   PRAPARE - Hydrologist (Medical): No    Lack of Transportation (Non-Medical): No  Physical Activity: Sufficiently Active (09/03/2020)   Exercise Vital Sign    Days of Exercise per Week: 7 days    Minutes of Exercise per Session: 60 min  Recent Concern: Physical Activity - Insufficiently Active (08/06/2020)   Exercise Vital Sign    Days of Exercise per Week: 2 days    Minutes of Exercise per Session: 40 min  Stress: No Stress Concern Present (08/06/2020)   [place]    Feeling of Stress : Only a little  Social Connections: Moderately Isolated (01/12/2022)   Social Connection and Isolation Panel [NHANES]    Frequency of Communication with  Friends and Family: More than three times a week    Frequency of Social Gatherings with Friends and Family: Once a week    Attends Religious Services: Never    Marine scientist or Organizations: Yes    Attends Music therapist: More than 4 times per year    Marital Status: Never married  Intimate Partner Violence: Not At Risk (04/14/2021)   Humiliation, Afraid, Rape, and Kick questionnaire    Fear of Current or Ex-Partner: No    Emotionally Abused: No    Physically Abused: No    Sexually Abused:  No    Review of Systems: All other review of systems negative except as mentioned in the HPI.  Physical Exam: Vital signs BP 124/80   Pulse (!) 101   Temp 98 F (36.7 C) (Skin)   Ht 5' 5.5" (1.664 m)   Wt 161 lb 12.8 oz (73.4 kg)   SpO2 97%   BMI 26.52 kg/m   General:   Alert,  Well-developed, pleasant and cooperative in NAD Lungs:  Clear throughout to auscultation.   Heart:  Regular rate and rhythm Abdomen:  Soft, nontender and nondistended.   Neuro/Psych:  Alert and cooperative. Normal mood and affect. A and O x 3  Jolly Mango, MD [facility]

## 2022-02-10 NOTE — Progress Notes (Signed)
Pt's states no medical or surgical changes since previsit or office visit. VS assessed by S.S

## 2022-02-10 NOTE — Patient Instructions (Signed)
Discharge instructions given. Handouts on polyps,diverticulosis and hemorrhoids. Resume previous medications. YOU HAD AN ENDOSCOPIC PROCEDURE TODAY AT THE [facility]:   Refer to the procedure report that was given to you for any specific questions about what was found during the examination.  If the procedure report does not answer your questions, please call your gastroenterologist to clarify.  If you requested that your care partner not be given the details of your procedure findings, then the procedure report has been included in a sealed envelope for you to review at your convenience later.  YOU SHOULD EXPECT: Some feelings of bloating in the abdomen. Passage of more gas than usual.  Walking can help get rid of the air that was put into your GI tract during the procedure and reduce the bloating. If you had a lower endoscopy (such as a colonoscopy or flexible sigmoidoscopy) you may notice spotting of blood in your stool or on the toilet paper. If you underwent a bowel prep for your procedure, you may not have a normal bowel movement for a few days.  Please Note:  You might notice some irritation and congestion in your nose or some drainage.  This is from the oxygen used during your procedure.  There is no need for concern and it should clear up in a day or so.  SYMPTOMS TO REPORT IMMEDIATELY:  Following lower endoscopy (colonoscopy or flexible sigmoidoscopy):  Excessive amounts of blood in the stool  Significant tenderness or worsening of abdominal pains  Swelling of the abdomen that is new, acute  Fever of 100F or higher   For urgent or emergent issues, a gastroenterologist can be reached at any hour by calling [phone]. Do not use MyChart messaging for urgent concerns.    DIET:  We do recommend a small meal at first, but then you may proceed to your regular diet.  Drink plenty of fluids but you should avoid alcoholic beverages for 24 hours.  ACTIVITY:  You should  plan to take it easy for the rest of today and you should NOT DRIVE or use heavy machinery until tomorrow (because of the sedation medicines used during the test).    FOLLOW UP: Our staff will call the number listed on your records the next business day following your procedure.  We will call around 7:15- 8:00 am to check on you and address any questions or concerns that you may have regarding the information given to you following your procedure. If we do not reach you, we will leave a message.     If any biopsies were taken you will be contacted by phone or by letter within the next 1-3 weeks.  Please call us at [phone] if you have not heard about the biopsies in 3 weeks.    SIGNATURES/CONFIDENTIALITY: You and/or your care partner have signed paperwork which will be entered into your electronic medical record.  These signatures attest to the fact that that the information above on your After Visit Summary has been reviewed and is understood.  Full responsibility of the confidentiality of this discharge information lies with you and/or your care-partner. 

## 2022-02-10 NOTE — Op Note (Signed)
[facility] Patient Name: Isaiah Black Procedure Date: 02/10/2022 11:09 AM MRN: [id number] Endoscopist: Remo Lipps P. Havery Moros , MD, [id number] Age: 47 Referring MD:  Date of Birth: 11-Jun-1975 Gender: Male Account #: [id number] Procedure:                Colonoscopy Indications:              Screening in patient at increased risk: Family                            history of 1st-degree relative with colorectal                            cancer (mother diagnosed age 58s), This is the                            patient's first colonoscopy Medicines:                Monitored Anesthesia Care Procedure:                Pre-Anesthesia Assessment:                           - Prior to the procedure, a History and Physical                            was performed, and patient medications and                            allergies were reviewed. The patient's tolerance of                            previous anesthesia was also reviewed. The risks                            and benefits of the procedure and the sedation                            options and risks were discussed with the patient.                            All questions were answered, and informed consent                            was obtained. Prior Anticoagulants: The patient has                            taken no anticoagulant or antiplatelet agents. ASA                            Grade Assessment: II - A patient with mild systemic                            disease. After reviewing the risks and benefits,  the patient was deemed in satisfactory condition to                            undergo the procedure.                           After obtaining informed consent, the colonoscope                            was passed under direct vision. Throughout the                            procedure, the patient's blood pressure, pulse, and                            oxygen saturations were monitored  continuously. The                            Colonoscope Olympus [id number] was introduced through                            the anus and advanced to the the cecum, identified                            by appendiceal orifice and ileocecal valve. The                            colonoscopy was performed without difficulty. The                            patient tolerated the procedure well. The quality                            of the bowel preparation was good. The ileocecal                            valve, appendiceal orifice, and rectum were                            photographed. Scope In: 11:26:22 AM Scope Out: 11:44:32 AM Scope Withdrawal Time: 0 hours 16 minutes 7 seconds  Total Procedure Duration: 0 hours 18 minutes 10 seconds  Findings:                 The perianal and digital rectal examinations were                            normal.                           A 4 to 5 mm polyp was found in the cecum. The polyp                            was sessile. The polyp was removed with a cold  snare. Resection and retrieval were complete.                           A 5 mm polyp was found in the hepatic flexure. The                            polyp was sessile. The polyp was removed with a                            cold snare. Resection and retrieval were complete.                           A 4 mm polyp was found in the sigmoid colon. The                            polyp was sessile. The polyp was removed with a                            cold snare. Resection and retrieval were complete.                           A few small-mouthed diverticula were found in the                            sigmoid colon.                           Internal hemorrhoids were found during                            retroflexion. The hemorrhoids were small.                           The exam was otherwise without abnormality. Complications:            No immediate complications.  Estimated blood loss:                            Minimal. Estimated Blood Loss:     Estimated blood loss was minimal. Impression:               - One 4 to 5 mm polyp in the cecum, removed with a                            cold snare. Resected and retrieved.                           - One 5 mm polyp at the hepatic flexure, removed                            with a cold snare. Resected and retrieved.                           - One 4 mm polyp in the  sigmoid colon, removed with                            a cold snare. Resected and retrieved.                           - Diverticulosis in the sigmoid colon.                           - Internal hemorrhoids.                           - The examination was otherwise normal. Recommendation:           - Patient has a contact number available for                            emergencies. The signs and symptoms of potential                            delayed complications were discussed with the                            patient. Return to normal activities tomorrow.                            Written discharge instructions were provided to the                            patient.                           - Resume previous diet.                           - Continue present medications.                           - Await pathology results. Remo Lipps P. Rowyn Spilde, MD 02/10/2022 11:51:00 AM This report has been signed electronically.

## 2022-02-11 ENCOUNTER — Telehealth: Payer: Self-pay

## 2022-02-11 NOTE — Telephone Encounter (Signed)
  Follow up Call-     02/10/2022   10:22 AM  Call back number  Post procedure Call Back phone  # [phone]  Permission to leave phone message Yes     Patient questions:  Do you have a fever, pain , or abdominal swelling? No. Pain Score  0 *  Have you tolerated food without any problems? Yes.    Have you been able to return to your normal activities? Yes.    Do you have any questions about your discharge instructions: Diet   No. Medications  No. Follow up visit  No.  Do you have questions or concerns about your Care? No.  Actions: * If pain score is 4 or above: No action needed, pain <4.

## 2022-02-17 ENCOUNTER — Ambulatory Visit (HOSPITAL_COMMUNITY): Payer: Medicaid Other

## 2022-02-18 ENCOUNTER — Encounter (HOSPITAL_COMMUNITY): Payer: Self-pay

## 2022-02-18 ENCOUNTER — Ambulatory Visit (HOSPITAL_COMMUNITY): Payer: Medicaid Other

## 2022-02-18 VITALS — BP 125/81 | HR 86 | Ht 65.0 in | Wt 159.0 lb

## 2022-02-18 DIAGNOSIS — F333 Major depressive disorder, recurrent, severe with psychotic symptoms: Secondary | ICD-10-CM

## 2022-02-18 DIAGNOSIS — F411 Generalized anxiety disorder: Secondary | ICD-10-CM

## 2022-02-18 DIAGNOSIS — G2401 Drug induced subacute dyskinesia: Secondary | ICD-10-CM

## 2022-02-18 DIAGNOSIS — F32A Depression, unspecified: Secondary | ICD-10-CM

## 2022-02-18 DIAGNOSIS — F200 Paranoid schizophrenia: Secondary | ICD-10-CM

## 2022-02-18 NOTE — Progress Notes (Signed)
Patient arrived with Mom for injection:  ARIPiprazole ER (ABILIFY MAINTENA) 400 MG    Patient Pleasant as Always . Was given in the LEFT Deltoid

## 2022-02-24 ENCOUNTER — Ambulatory Visit (INDEPENDENT_AMBULATORY_CARE_PROVIDER_SITE_OTHER): Payer: Medicaid Other | Admitting: Licensed Clinical Social Worker

## 2022-02-24 DIAGNOSIS — F411 Generalized anxiety disorder: Secondary | ICD-10-CM

## 2022-02-24 DIAGNOSIS — F200 Paranoid schizophrenia: Secondary | ICD-10-CM | POA: Diagnosis not present

## 2022-02-24 NOTE — Progress Notes (Signed)
THERAPIST PROGRESS NOTE  Session Time: 30   Participation Level: Active  Behavioral Response: CasualAlertAnxious  Type of Therapy: Individual Therapy  Treatment Goals addressed:   Active     Anxiety Disorder CCP Problem  1 GAD      walk 3 x weekly  (Completed/Met)     Start:  03/24/21    Expected End:  04/01/22    Resolved:  01/12/22      Find independent housing  (Not Progressing)     Start:  03/24/21    Expected End:  04/01/22       Goal Note     Pt has been struggling finding housing due to be a registered sex offender           Decrease nightmare to 2/7 days weekly  (Progressing)     Start:  03/24/21    Expected End:  04/01/22       Goal Note     Pt reports no nightmare this month          LTG: Patient will score less than 5 on the Generalized Anxiety Disorder 7 Scale (GAD-7) (Not Progressing)     Start:  03/24/21    Expected End:  04/01/22       Goal Note     Pt increased GAD-7 by 1 point today          STG: Patient will complete at least 80% of assigned homework (Progressing)     Start:  03/24/21    Expected End:  04/01/22       Goal Note     Pt homework will be to shower 2 x weekly and eat 3 meals for breakfast, lunch and dinner 4/7 days per week over the next 3 weeks          STG: Patient will practice problem solving skills 3 times per week for the next 4 weeks (Progressing)     Start:  03/24/21    Expected End:  04/01/22       Goal Note     Decrease anxiety in social setting by utilizing deep breathing exercises 3 x weekly          STG: Report a decrease in anxiety symptoms as evidenced by an overall reduction in anxiety score by a minimum of 25% on the Generalized Anxiety Disorder Scale (Completed/Met)     Start:  03/24/21    Expected End:  04/01/22    Resolved:  12/22/21         Interventions: Motivational Interviewing, Supportive, and Reframing  Summary: Isaiah Black is a 47 y.o. male who presents with anxious  mood\affect.  Patient was pleasant, cooperative, maintained good eye contact.  He engaged well in therapy session was dressed casually.  LCSW spoke with patient about primary stressors and patient reported for social anxiety.  Patient reports at this time that his anxiety increases when he walks past "homeless people".  LCSW spoke with patient about deep breathing exercises such as "5, 4, 3, 2, 1 grounding technique".  Other stressors for patient are nightmares which patient reports they have been decreasing over the past several months.  Patient reports no nightmares in the past 2 weeks.  Patient also reports poor hygiene for only showering 1 time per week.  Suicidal/Homicidal: Nowithout intent/plan  Therapist Response:    Plan/Intervention: Plan for patient is to shower 2/7 days per week.  With the overall goal to be 3 out of 7 days per week.  Patient reports decrease in appetite.  Patient to eat 3 meals per day 4 out of the 7 days per week.  LCSW used interventions for solution focused therapy, psychoanalytic therapy, and motivational interviewing.  LCSW psychoanalytic therapy for patient to express thoughts, feelings and emotions.  LCSW supportive therapy for praise and encouragement.   Plan: Return again in 4 weeks.  Diagnosis: Paranoid schizophrenia ([place])  Generalized anxiety disorder  Collaboration of Care: Other None today.   Patient/Guardian was advised Release of Information must be obtained prior to any record release in order to collaborate their care with an outside provider. Patient/Guardian was advised if they have not already done so to contact the registration department to sign all necessary forms in order for [place] to release information regarding their care.   Consent: Patient/Guardian gives verbal consent for treatment and assignment of benefits for services provided during this visit. Patient/Guardian expressed understanding and agreed to proceed.   Dory Horn,  LCSW 02/24/2022

## 2022-02-25 ENCOUNTER — Ambulatory Visit (HOSPITAL_COMMUNITY): Payer: Medicaid Other

## 2022-02-25 ENCOUNTER — Ambulatory Visit (HOSPITAL_COMMUNITY): Payer: Medicaid Other | Admitting: Psychiatry

## 2022-03-10 ENCOUNTER — Encounter (HOSPITAL_COMMUNITY): Payer: Self-pay

## 2022-03-10 ENCOUNTER — Ambulatory Visit (INDEPENDENT_AMBULATORY_CARE_PROVIDER_SITE_OTHER): Payer: Medicaid Other | Admitting: Licensed Clinical Social Worker

## 2022-03-10 DIAGNOSIS — F200 Paranoid schizophrenia: Secondary | ICD-10-CM

## 2022-03-10 DIAGNOSIS — F411 Generalized anxiety disorder: Secondary | ICD-10-CM

## 2022-03-10 NOTE — Progress Notes (Signed)
THERAPIST PROGRESS NOTE  Session Time: 30  Participation Level: Active  Behavioral Response: CasualAlertAnxious  Type of Therapy: Individual Therapy  Treatment Goals addressed:  Active     Anxiety Disorder CCP Problem  1 GAD      walk 3 x weekly  (Completed/Met)     Start:  03/24/21    Expected End:  04/01/22    Resolved:  01/12/22      Find independent housing  (Not Progressing)     Start:  03/24/21    Expected End:  04/01/22         Decrease nightmare to 2/7 days weekly  (Progressing)     Start:  03/24/21    Expected End:  04/01/22       Goal Note     1 nightmare in the last 4 weeks.          LTG: Patient will score less than 5 on the Generalized Anxiety Disorder 7 Scale (GAD-7) (Progressing)     Start:  03/24/21    Expected End:  04/01/22         STG: Patient will complete at least 80% of assigned homework (Progressing)     Start:  03/24/21    Expected End:  04/01/22         STG: Patient will practice problem solving skills 3 times per week for the next 4 weeks (Progressing)     Start:  03/24/21    Expected End:  04/01/22         STG: Report a decrease in anxiety symptoms as evidenced by an overall reduction in anxiety score by a minimum of 25% on the Generalized Anxiety Disorder Scale (Completed/Met)     Start:  03/24/21    Expected End:  04/01/22    Resolved:  12/22/21         ProgressTowards Goals: Progressing  Interventions: CBT, Motivational Interviewing, and Supportive  Summary: Isaiah Black is a 47 y.o. male who presents with   anxious mood\affect.  Patient was pleasant, cooperative, maintained good eye contact.  He was dressed casually and engaged well in therapy session.  Patient states that everything has been going "well".  Patient reports primary stressors as family conflict and illness.  Patient reports that his mother has a slipped disc and is supposed to be getting back surgery in the next several weeks.  Patient reports tension  and worry due to his mother's surgery.   Other stressors is that his daughter [name] who lives with patient's ex girlfriend in [place] has started to talk about "masturbation".  Patient continues to state that she is "46 years old".  Isaiah Black and patient spoke about solutions and recommendations such as talking to the school counselor moving forward to discuss the matter.   Patient reports some good news as he has gotten a new Arts development officer for his [place].  He states that this printer is more advanced than his other parameters.  Such as it as a camera to watch how progress is being made.  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   Suicidal/Homicidal: Nowithout intent/plan  Therapist Response:    Intervention/Plan: Isaiah Black spoke about patient's plan moving forward.  Patient to be seen every 4 weeks instead of every 3 weeks due to progress being made.  This is as evidenced by GAD-7 moving from a 9 to a 5.  Isaiah Black also notes a decrease in PHQ-9 from 11 to a 6.  Patient  to increase visits [facility] from 1 day a week to 2 days a week.  Isaiah Black supportive therapy for praise and encouragement.  Isaiah Black provided solution focused therapy by providing patient with education on school counselors and resources that they may be able to provide for patient Plan: Return again in 3 weeks.  Diagnosis: Paranoid schizophrenia ([place])  Generalized anxiety disorder  Collaboration of Care: Other None today.   Patient/Guardian was advised Release of Information must be obtained prior to any record release in order to collaborate their care with an outside provider. Patient/Guardian was advised if they have not already done so to contact the registration department to sign all necessary forms in order for [place] to release information regarding their care.   Consent: Patient/Guardian gives verbal consent for treatment and assignment of benefits for services provided  during this visit. Patient/Guardian expressed understanding and agreed to proceed.   Dory Horn, Isaiah Black 03/10/2022

## 2022-03-16 ENCOUNTER — Other Ambulatory Visit (INDEPENDENT_AMBULATORY_CARE_PROVIDER_SITE_OTHER): Payer: Self-pay | Admitting: Primary Care

## 2022-03-18 ENCOUNTER — Encounter (HOSPITAL_COMMUNITY): Payer: Self-pay | Admitting: Psychiatry

## 2022-03-18 ENCOUNTER — Ambulatory Visit (INDEPENDENT_AMBULATORY_CARE_PROVIDER_SITE_OTHER): Payer: Medicaid Other

## 2022-03-18 ENCOUNTER — Encounter (HOSPITAL_COMMUNITY): Payer: Self-pay

## 2022-03-18 ENCOUNTER — Ambulatory Visit (INDEPENDENT_AMBULATORY_CARE_PROVIDER_SITE_OTHER): Payer: Medicaid Other | Admitting: Psychiatry

## 2022-03-18 VITALS — BP 128/88 | HR 80 | Temp 97.8°F | Resp 18 | Ht 65.0 in | Wt 162.4 lb

## 2022-03-18 DIAGNOSIS — G2401 Drug induced subacute dyskinesia: Secondary | ICD-10-CM

## 2022-03-18 DIAGNOSIS — F32A Depression, unspecified: Secondary | ICD-10-CM

## 2022-03-18 DIAGNOSIS — F411 Generalized anxiety disorder: Secondary | ICD-10-CM

## 2022-03-18 DIAGNOSIS — F200 Paranoid schizophrenia: Secondary | ICD-10-CM

## 2022-03-18 MED ORDER — HYDROXYZINE HCL 50 MG PO TABS: ORAL_TABLET | ORAL | 3 refills | Status: DC

## 2022-03-18 MED ORDER — MIRTAZAPINE 7.5 MG PO TABS: ORAL_TABLET | ORAL | 3 refills | Status: DC

## 2022-03-18 MED ORDER — BUSPIRONE HCL 15 MG PO TABS: 30.0000 mg | ORAL_TABLET | Freq: Two times a day (BID) | ORAL | 3 refills | Status: DC

## 2022-03-18 MED ORDER — ABILIFY MAINTENA 400 MG IM PRSY: PREFILLED_SYRINGE | INTRAMUSCULAR | 10 refills | Status: DC

## 2022-03-18 MED ORDER — VALBENAZINE TOSYLATE 40 MG PO CAPS: 40.0000 mg | ORAL_CAPSULE | Freq: Every day | ORAL | 3 refills | Status: DC

## 2022-03-18 NOTE — Progress Notes (Signed)
Patient arrived with Mom for injection:  ARIPiprazole ER (ABILIFY MAINTENA) 400 MG    Patient Pleasant as Always . Was given in the RIGHT Deltoid

## 2022-03-18 NOTE — Progress Notes (Signed)
[place] MD/PA/NP OP Progress Note  03/18/2022 11:08 AM Isaiah Black  MRN:  [id number]  Chief Complaint: "I have been busy printing"  HPI: 50 old male seen today for follow-up psychiatric evaluation.  He has a psychiatric history of paranoid schizophrenia, anxiety, and disassociation disorder.  He is currently being managed on Buspar 30 mg twice daily, Mirtazapine 7.5 mg nightly, hydroxyzine 50 mg three times daily as needed, and Abilify 400 mg monthly injection. Patient reported that his medications are effective in managing his psychiatric condition.   Today he is fairly groomed, pleasant, cooperative, engaged in conversation, maintained eye contact.  He informed provider that he has been staying busy printing.  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  Patient informed writer that overall his mood is stable and notes that he has minimal anxiety and depression.  Provider conducted a GAD-7 and patient scored an 11, to his last visit he scored a 10.  Patient informed Probation officer that his anxiety is worsening crowds.  Provider also conducted PHQ-9 and patient scored a 12, at his last visit he scored a 13.  He endorses adequate sleep and appetite.  Today he denies SI/HI/VH, mania or paranoia.  Patient continues to have auditory hallucination noting that he hears chatter but reports that he is able to cope with it.     Patient informed Probation officer that he is looking forward to the summer as his daughter will be visiting him.  No medication changes made today.  Patient agreeable to continue medications as prescribed.  No other concerns at this time. Visit Diagnosis:    ICD-10-CM   1. Tardive dyskinesia  G24.01 valbenazine (INGREZZA) 40 MG capsule    2. Generalized anxiety disorder  F41.1 mirtazapine (REMERON) 7.5 MG tablet    hydrOXYzine (ATARAX) 50 MG tablet     busPIRone (BUSPAR) 15 MG tablet    3. Mild depression  F32.A mirtazapine (REMERON) 7.5 MG tablet    busPIRone (BUSPAR) 15 MG tablet    4. Paranoid schizophrenia ([place])  F20.0 ARIPiprazole ER (ABILIFY MAINTENA) 400 MG PRSY prefilled syringe      Past Psychiatric History: paranoid schizophrenia, anxiety, and disassociation disorder.   Past Medical History:  Past Medical History:  Diagnosis Date   Anxiety    Asthma    Depression    Disassociation disorder    Paranoid schizophrenia ([place])    No past surgical history on file.  Family Psychiatric History: Paternal uncle schizophrenia, Father Bipolar 1, Maternal aunt Bipolar 1, Niece depression  Family History:  Family History  Problem Relation Age of Onset   Colon cancer Mother        mid 13s   Diabetes Mother    Diabetes Maternal Aunt    Diabetes Maternal Uncle    Stomach cancer Neg Hx    Esophageal cancer Neg Hx     Social History:  Social History   Socioeconomic History   Marital status: Single    Spouse name: Not on file   Number of children: 1   Years of education: Not on file   Highest education level: Not on file  Occupational History   Not on file  Tobacco Use   Smoking status: Former   Smokeless tobacco: Never  Vaping Use   Vaping Use: Former  Substance and Sexual Activity   Alcohol  use: Not Currently    Comment: occ   Drug use: Not Currently   Sexual activity: Not Currently  Other Topics Concern   Not on file  Social History Narrative   Not on file   Social Determinants of Health   Financial Resource Strain: Low Risk  (08/06/2020)   Overall Financial Resource Strain (CARDIA)    Difficulty of Paying Living Expenses: Not hard at all  Food Insecurity: No Food Insecurity (04/14/2021)   Hunger Vital Sign    Worried About Running Out of Food in the Last Year: Never true    Ran Out of Food in the Last Year: Never true  Transportation Needs: No Transportation Needs (04/14/2021)   PRAPARE - Armed forces logistics/support/administrative officer (Medical): No    Lack of Transportation (Non-Medical): No  Physical Activity: Sufficiently Active (09/03/2020)   Exercise Vital Sign    Days of Exercise per Week: 7 days    Minutes of Exercise per Session: 60 min  Recent Concern: Physical Activity - Insufficiently Active (08/06/2020)   Exercise Vital Sign    Days of Exercise per Week: 2 days    Minutes of Exercise per Session: 40 min  Stress: No Stress Concern Present (08/06/2020)   [place]    Feeling of Stress : Only a little  Social Connections: Moderately Isolated (01/12/2022)   Social Connection and Isolation Panel [NHANES]    Frequency of Communication with Friends and Family: More than three times a week    Frequency of Social Gatherings with Friends and Family: Once a week    Attends Religious Services: Never    Marine scientist or Organizations: Yes    Attends Music therapist: More than 4 times per year    Marital Status: Never married    Allergies:  Allergies  Allergen Reactions   Cogentin [Benztropine]     Metabolic Disorder Labs: Lab Results  Component Value Date   HGBA1C 5.5 01/21/2022   MPG 85.32 03/07/2019   MPG 99.67 07/08/2017   Lab Results  Component Value Date   PROLACTIN 8.6 06/17/2021   PROLACTIN 100.0 (H) 03/07/2019   Lab Results  Component Value Date   CHOL 183 06/17/2021   TRIG 152 (H) 06/17/2021   HDL 49 06/17/2021   CHOLHDL 3.7 06/17/2021   VLDL 10 03/07/2019   LDLCALC 107 (H) 06/17/2021   LDLCALC 80 03/07/2019   Lab Results  Component Value Date   TSH 2.160 01/21/2022   TSH 6.510 (H) 06/17/2021    Therapeutic Level Labs: No results found for: "LITHIUM" No results found for: "VALPROATE" No results found for: "CBMZ"  Current Medications: Current Outpatient Medications  Medication Sig Dispense Refill   albuterol (VENTOLIN HFA) 108 (90 Base) MCG/ACT inhaler Inhale 2 puffs  into the lungs every 6 (six) hours as needed for wheezing or shortness of breath. 18 each 3   ARIPiprazole ER (ABILIFY MAINTENA) 400 MG PRSY prefilled syringe INJECT EVERY 28 TO 30 DAYS 1 each 10   betamethasone valerate ointment (VALISONE) 0.1 % Apply 1 Application topically 2 (two) times daily. 45 g 3   budesonide-formoterol (SYMBICORT) 160-4.5 MCG/ACT inhaler Inhale 2 puffs into the lungs 2 (two) times daily. 3 each 4   busPIRone (BUSPAR) 15 MG tablet Take 2 tablets (30 mg total) by mouth 2 (two) times daily. 60 tablet 3   hydrOXYzine (ATARAX) 50 MG tablet TAKE 1 TABLET(50 MG) BY MOUTH THREE TIMES  DAILY AS NEEDED 90 tablet 3   mirtazapine (REMERON) 7.5 MG tablet TAKE 1 TABLET(7.5 MG) BY MOUTH AT BEDTIME 30 tablet 3   montelukast (SINGULAIR) 10 MG tablet TAKE 1 TABLET (10 MG TOTAL) BY MOUTH AT BEDTIME. 30 tablet 3   valbenazine (INGREZZA) 40 MG capsule Take 1 capsule (40 mg total) by mouth daily. 90 capsule 3   Current Facility-Administered Medications  Medication Dose Route Frequency Provider Last Rate Last Admin   0.9 %  sodium chloride infusion  500 mL Intravenous Once Armbruster, Carlota Raspberry, MD       0.9 %  sodium chloride infusion  500 mL Intravenous Once Armbruster, Carlota Raspberry, MD       ARIPiprazole ER (ABILIFY MAINTENA) 400 MG prefilled syringe 400 mg  400 mg Intramuscular Q28 days Lucky Rathke, FNP   400 mg at 03/18/22 1045     Musculoskeletal: Strength & Muscle Tone: within normal limits Gait & Station: normal Patient leans: N/A  Psychiatric Specialty Exam: Review of Systems  There were no vitals taken for this visit.There is no height or weight on file to calculate BMI.  General Appearance: Well Groomed  Eye Contact:  Good  Speech:  Clear and Coherent and Normal Rate  Volume:  Normal  Mood:  Euthymic  Affect:  Appropriate and Congruent  Thought Process:  Coherent, Goal Directed, and Linear  Orientation:  Full (Time, Place, and Person)  Thought Content: Logical and  Hallucinations: Auditory   Suicidal Thoughts:  No  Homicidal Thoughts:  No  Memory:  Immediate;   Good Recent;   Good Remote;   Good  Judgement:  Good  Insight:  Good  Psychomotor Activity:  Normal  Concentration:  Concentration: Good and Attention Span: Good  Recall:  Good  Fund of Knowledge: Good  Language: Good  Akathisia:  No  Handed:  Right  AIMS (if indicated): done  Assets:  Communication Skills Desire for Improvement Financial Resources/Insurance Housing Leisure Time Physical Health Social Support  ADL's:  Intact  Cognition: WNL  Sleep:  Good   Screenings: [place] Office Visit from 03/18/2022 in [place] from 01/20/2022 in [facility] Office Visit from 11/26/2021 in [facility] Office Visit from 08/06/2021 in [facility] Office Visit from 05/12/2021 in [place] Total Score 0 0 0 4 0      AUDIT    Flowsheet Row Admission (Discharged) from 03/06/2019 in [place] Admission (Discharged) from 07/06/2017 in [place] 500B  Alcohol Use Disorder Identification Test Final Score (AUDIT) 25 2      CAGE-AID    Flowsheet Row Counselor from 07/03/2019 in [place] Score 0      GAD-7    [place] Office Visit from 03/18/2022 in [facility] Counselor from 03/10/2022 in [facility] Counselor from 02/24/2022 in [facility] Counselor from 02/09/2022 in [facility] Office Visit from 01/21/2022 in [place]  Total GAD-7 Score 11 5 9 8 7      $ PHQ2-9    [place] Visit from 03/18/2022 in [facility] Counselor from 03/10/2022  in [facility] Counselor from 02/24/2022 in [facility] Counselor from 02/09/2022 in [place]  [facility] Office Visit from 01/21/2022 in [place]  PHQ-2 Total Score 1 1 2 1 2  $ PHQ-9 Total Score 12 6 11 6 13      $ Flowsheet Row Counselor from 02/09/2022 in [facility] Office Visit from 11/26/2021 in [facility] Office Visit from 08/06/2021 in [place] No Risk No Risk No Risk        Assessment and Plan: Patient endorses auditory hallucination and mild anxiety while in crowds.  He however reports that he is able to cope with this.  No medication changes made today.  Patient agreeable to continue medication as prescribed.   1. Paranoid schizophrenia ([place])  Continue- ARIPiprazole ER (ABILIFY MAINTENA) 400 MG PRSY prefilled syringe; INJECT EVERY 28 TO 30 DAYS  Dispense: 1 each; Refill: 10  2. Generalized anxiety disorder  Continue- busPIRone (BUSPAR) 15 MG tablet; Take 2 tablets (30 mg total) by mouth 2 (two) times daily.  Dispense: 60 tablet; Refill: 3 Continue- hydrOXYzine (ATARAX) 50 MG tablet; TAKE 1 TABLET(50 MG) BY MOUTH THREE TIMES DAILY AS NEEDED  Dispense: 90 tablet; Refill: 3 Continue- mirtazapine (REMERON) 7.5 MG tablet; TAKE 1 TABLET(7.5 MG) BY MOUTH AT BEDTIME  Dispense: 30 tablet; Refill: 3  3. Mild depression  Continue- busPIRone (BUSPAR) 15 MG tablet; Take 2 tablets (30 mg total) by mouth 2 (two) times daily.  Dispense: 60 tablet; Refill: 3 Continue- mirtazapine (REMERON) 7.5 MG tablet; TAKE 1 TABLET(7.5 MG) BY MOUTH AT BEDTIME  Dispense: 30 tablet; Refill: 3  4. Tardive dyskinesia  Continue- valbenazine (INGREZZA) 40 MG capsule; Take 1 capsule (40 mg total) by mouth daily.  Dispense: 90 capsule; Refill: 3   Follow-up in 3 months Salley Slaughter,  NP 03/18/2022, 11:08 AM

## 2022-03-31 ENCOUNTER — Ambulatory Visit (INDEPENDENT_AMBULATORY_CARE_PROVIDER_SITE_OTHER): Payer: Medicaid Other | Admitting: Licensed Clinical Social Worker

## 2022-03-31 DIAGNOSIS — F200 Paranoid schizophrenia: Secondary | ICD-10-CM

## 2022-03-31 NOTE — Progress Notes (Signed)
THERAPIST PROGRESS NOTE  Session Time: 30   Participation Level: Active  Behavioral Response: CasualAlertAnxious and Depressed  Type of Therapy: Individual Therapy  Treatment Goals addressed:  Active     Anxiety Disorder CCP Problem  1 GAD      walk 3 x weekly  (Completed/Met)     Start:  03/24/21    Expected End:  04/01/22    Resolved:  01/12/22      Find independent housing  (Not Progressing)     Start:  03/24/21    Expected End:  04/01/22       Goal Note     Pt reports no leads at this time.          Decrease nightmare to 2/7 days weekly  (Completed/Met)     Start:  03/24/21    Expected End:  04/01/22    Resolved:  03/31/22    Goal Note     Pt reports no nightmares that he can rememeber         LTG: Patient will score less than 5 on the Generalized Anxiety Disorder 7 Scale (GAD-7) (Progressing)     Start:  03/24/21    Expected End:  04/01/22       Goal Note     12 last week when medication provider took GAD-7 today down to a 7          STG: Patient will complete at least 80% of assigned homework (Progressing)     Start:  03/24/21    Expected End:  04/01/22         STG: Patient will practice problem solving skills 3 times per week for the next 4 weeks (Progressing)     Start:  03/24/21    Expected End:  04/01/22         STG: Report a decrease in anxiety symptoms as evidenced by an overall reduction in anxiety score by a minimum of 25% on the Generalized Anxiety Disorder Scale (Completed/Met)     Start:  03/24/21    Expected End:  04/01/22    Resolved:  12/22/21         ProgressTowards Goals: Progressing  Interventions: Motivational Interviewing and Supportive  Summary: Eber Hawk is a 47 y.o. male who presents with euthymic mood\affect.  Patient was pleasant, cooperative, maintained good eye contact.  He engaged well in therapy session was dressed casually.  Patient comes in today as primary stressors mother's illness.  Patient  reports that she has been unable to utilize full range of her shoulders due to a pinched nerve.  [place] states that him and his mother have been fighting for patient's mother to get test done on the shoulder but insurance has been a hold-up.  Patient reports that Medicare is only paying a portion of the test and they still over $200.  [place] states that the facility was unaware that patient's mother had Medicaid as well and has to resubmit to Medicaid to see if they will pay the other $200.   Patient also reports stressors family conflict with his daughter.  He states that he would like to have [name] for a few weeks this summer.  He reports needing to get a rental car and saving money for gas and other things needed for the trip.  Jeremiyah states that he will start doing that in the months of March and April.  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  Suicidal/Homicidal: Nowithout intent/plan  Therapist Response:     Intervention/Plan: LCSW administered GAD-7.  LCSW administered the PHQ-9.  LCSW notes a decrease in both GAD-7 and PHQ-9 since last administered 10 days ago by medication provider.  Patient reports continuing to engage in things such as sanctuary house social support groups and also 3D printing as a hobby.  LCSW educated patient on taking medications as prescribed.  LCSW spoke with patient today about the benefits of taking medications to decrease symptoms for his mental health diagnosis.  LCSW went over signs and symptoms of paranoid schizophrenia in session.  Plan: Return again in 4  weeks.  Diagnosis: No diagnosis found.  Collaboration of Care: Other None today   Patient/Guardian was advised Release of Information must be obtained prior to any record release in order to collaborate their care with an outside provider. Patient/Guardian was advised if they have not already  done so to contact the registration department to sign all necessary forms in order for [place] to release information regarding their care.   Consent: Patient/Guardian gives verbal consent for treatment and assignment of benefits for services provided during this visit. Patient/Guardian expressed understanding and agreed to proceed.   Dory Horn, LCSW 03/31/2022

## 2022-04-02 ENCOUNTER — Telehealth (HOSPITAL_COMMUNITY): Payer: Self-pay

## 2022-04-05 ENCOUNTER — Other Ambulatory Visit (HOSPITAL_COMMUNITY): Payer: Self-pay | Admitting: Psychiatry

## 2022-04-05 DIAGNOSIS — G2401 Drug induced subacute dyskinesia: Secondary | ICD-10-CM

## 2022-04-05 DIAGNOSIS — F411 Generalized anxiety disorder: Secondary | ICD-10-CM

## 2022-04-05 DIAGNOSIS — F200 Paranoid schizophrenia: Secondary | ICD-10-CM

## 2022-04-05 DIAGNOSIS — F32A Depression, unspecified: Secondary | ICD-10-CM

## 2022-04-05 MED ORDER — HYDROXYZINE HCL 50 MG PO TABS: ORAL_TABLET | ORAL | 3 refills | Status: DC

## 2022-04-05 MED ORDER — MIRTAZAPINE 7.5 MG PO TABS: ORAL_TABLET | ORAL | 3 refills | Status: DC

## 2022-04-05 MED ORDER — ABILIFY MAINTENA 400 MG IM PRSY: PREFILLED_SYRINGE | INTRAMUSCULAR | 11 refills | Status: DC

## 2022-04-05 MED ORDER — BUSPIRONE HCL 15 MG PO TABS: 30.0000 mg | ORAL_TABLET | Freq: Two times a day (BID) | ORAL | 3 refills | Status: DC

## 2022-04-05 MED ORDER — VALBENAZINE TOSYLATE 40 MG PO CAPS: 40.0000 mg | ORAL_CAPSULE | Freq: Every day | ORAL | 3 refills | Status: DC

## 2022-04-05 NOTE — Telephone Encounter (Signed)
Medications refilled and sent to preferred pharmacy.

## 2022-04-07 ENCOUNTER — Telehealth (HOSPITAL_COMMUNITY): Payer: Self-pay

## 2022-04-15 ENCOUNTER — Encounter (HOSPITAL_COMMUNITY): Payer: Self-pay

## 2022-04-15 ENCOUNTER — Ambulatory Visit (HOSPITAL_COMMUNITY): Payer: Medicaid Other

## 2022-04-15 VITALS — BP 120/89 | HR 87 | Ht 65.0 in | Wt 161.2 lb

## 2022-04-15 DIAGNOSIS — F200 Paranoid schizophrenia: Secondary | ICD-10-CM

## 2022-04-15 DIAGNOSIS — F411 Generalized anxiety disorder: Secondary | ICD-10-CM

## 2022-04-15 DIAGNOSIS — G2401 Drug induced subacute dyskinesia: Secondary | ICD-10-CM

## 2022-04-15 DIAGNOSIS — F333 Major depressive disorder, recurrent, severe with psychotic symptoms: Secondary | ICD-10-CM

## 2022-04-15 NOTE — Progress Notes (Signed)
Patient arrived with Mom for injection:  ARIPiprazole ER (ABILIFY MAINTENA) 400 MG    Patient Pleasant as Always . Was given in the LEFT Deltoid 

## 2022-04-28 ENCOUNTER — Ambulatory Visit (INDEPENDENT_AMBULATORY_CARE_PROVIDER_SITE_OTHER): Payer: Medicaid Other | Admitting: Licensed Clinical Social Worker

## 2022-04-28 DIAGNOSIS — F200 Paranoid schizophrenia: Secondary | ICD-10-CM

## 2022-04-28 NOTE — Progress Notes (Signed)
THERAPIST PROGRESS NOTE  Session Time: 30  Participation Level: Active  Behavioral Response: CasualAlertAnxious  Type of Therapy: Individual Therapy  Treatment Goals addressed:  Active     Anxiety Disorder CCP Problem  1 GAD      walk 3 x weekly  (Completed/Met)     Start:  03/24/21    Expected End:  04/01/22    Resolved:  01/12/22      Find independent housing  (Not Progressing)     Start:  03/24/21    Expected End:  10/01/22         Decrease nightmare to 2/7 days weekly  (Completed/Met)     Start:  03/24/21    Expected End:  04/01/22    Resolved:  03/31/22    Goal Note     Pt reports no nightmares that he can rememeber         LTG: Patient will score less than 5 on the Generalized Anxiety Disorder 7 Scale (GAD-7) (Progressing)     Start:  03/24/21    Expected End:  10/01/22       Goal Note     Pt scoreda 9 today          STG: Patient will complete at least 80% of assigned homework (Progressing)     Start:  03/24/21    Expected End:  10/01/22         STG: Patient will practice problem solving skills 3 times per week for the next 4 weeks (Progressing)     Start:  03/24/21    Expected End:  10/01/22         STG: Report a decrease in anxiety symptoms as evidenced by an overall reduction in anxiety score by a minimum of 25% on the Generalized Anxiety Disorder Scale (Completed/Met)     Start:  03/24/21    Expected End:  04/01/22    Resolved:  12/22/21         ProgressTowards Goals: Progressing  Interventions: Motivational Interviewing, Supportive, and Reframing   Suicidal/Homicidal: Nowithout intent/plan  Therapist Response:    Patient was alert and oriented x 5.  He was pleasant, cooperative, maintained good eye contact.  Laury presented today with depressed and anxious mood\affect.  LCSW administered a PHQ-9.  LCSW administered the GAD-7.  LCSW notes that patient continues to maintain goal of 100 time for PHQ-9 and GAD-7 is at a 9 with goal  being below a 5.   Patient reports coping skills such as attending sanctuary house community meetings.  Patient reports coping skills for 3D printing.  Patient reports stress for auditory hallucinations waking up in the middle of the night thinking that he is hearing his name.  Patient reports coping skills for this such as listening to the television and "keeping busy.   LCSW psychoanalytic therapy for patient to express thoughts, feelings and emotions in session.  LCSW used person centered therapy for empowerment and unconditional positive regard.  Plan for patient is to continue to use coping skills to help with mental health diagnosis for schizophrenia depression and anxiety.   Plan: Return again in 4 weeks.  Diagnosis: Paranoid schizophrenia ([place])  Collaboration of Care: Other None today   Patient/Guardian was advised Release of Information must be obtained prior to any record release in order to collaborate their care with an outside provider. Patient/Guardian was advised if they have not already done so to contact the registration department to sign all necessary forms in order for [place]  to release information regarding their care.   Consent: Patient/Guardian gives verbal consent for treatment and assignment of benefits for services provided during this visit. Patient/Guardian expressed understanding and agreed to proceed.   Dory Horn, LCSW 04/28/2022

## 2022-05-13 ENCOUNTER — Ambulatory Visit (HOSPITAL_COMMUNITY): Payer: Medicaid Other

## 2022-05-18 ENCOUNTER — Other Ambulatory Visit (INDEPENDENT_AMBULATORY_CARE_PROVIDER_SITE_OTHER): Payer: Self-pay | Admitting: Primary Care

## 2022-05-18 DIAGNOSIS — J4540 Moderate persistent asthma, uncomplicated: Secondary | ICD-10-CM

## 2022-05-18 DIAGNOSIS — Z760 Encounter for issue of repeat prescription: Secondary | ICD-10-CM

## 2022-05-19 ENCOUNTER — Ambulatory Visit (INDEPENDENT_AMBULATORY_CARE_PROVIDER_SITE_OTHER): Payer: Medicaid Other | Admitting: *Deleted

## 2022-05-19 ENCOUNTER — Encounter (HOSPITAL_COMMUNITY): Payer: Self-pay

## 2022-05-19 VITALS — BP 122/85 | HR 85 | Resp 16 | Ht 65.0 in | Wt 160.8 lb

## 2022-05-19 DIAGNOSIS — F200 Paranoid schizophrenia: Secondary | ICD-10-CM | POA: Diagnosis not present

## 2022-05-19 NOTE — Progress Notes (Signed)
Patient arrived for injection of Abilify Maintena  that he brought with him. Given in Right Deltoid without issues or complaints. Denies SI/HI or visual hallucinations.  Does hear "chattering" at times. No side effects from medication. Pleasant and cooperative.

## 2022-05-20 ENCOUNTER — Telehealth (HOSPITAL_COMMUNITY): Payer: Self-pay | Admitting: *Deleted

## 2022-05-20 NOTE — Telephone Encounter (Signed)
REOPENED IN ERROR 

## 2022-05-20 NOTE — Telephone Encounter (Signed)
valbenazine (INGREZZA) 40 MG capsule   Take 1 capsule (40 mg total) by mouth daily., Starting Mon 04/05/2022, Normal Dose, Route, Frequency: 40 mg,  Oral, DailyDx Associated:  End Date Warning:  Start Date & Time: 04/05/2022 Ord/Sold: 04/05/2022 Val [place]) Ordered On: 04/05/2022 ReportPharmacy: [facility] - [place], [place] - [address]   Note to Pharmacy: **Patient requests 90 days supply**

## 2022-06-01 ENCOUNTER — Ambulatory Visit (HOSPITAL_COMMUNITY): Payer: Medicaid Other | Admitting: Licensed Clinical Social Worker

## 2022-06-01 ENCOUNTER — Encounter (HOSPITAL_COMMUNITY): Payer: Self-pay

## 2022-06-01 ENCOUNTER — Ambulatory Visit (INDEPENDENT_AMBULATORY_CARE_PROVIDER_SITE_OTHER): Payer: Medicaid Other | Admitting: Psychiatry

## 2022-06-01 DIAGNOSIS — F333 Major depressive disorder, recurrent, severe with psychotic symptoms: Secondary | ICD-10-CM

## 2022-06-01 DIAGNOSIS — F200 Paranoid schizophrenia: Secondary | ICD-10-CM

## 2022-06-01 DIAGNOSIS — G2401 Drug induced subacute dyskinesia: Secondary | ICD-10-CM

## 2022-06-01 DIAGNOSIS — F411 Generalized anxiety disorder: Secondary | ICD-10-CM

## 2022-06-01 DIAGNOSIS — F32A Depression, unspecified: Secondary | ICD-10-CM

## 2022-06-01 MED ORDER — BUSPIRONE HCL 15 MG PO TABS: 30.0000 mg | ORAL_TABLET | Freq: Two times a day (BID) | ORAL | 3 refills | Status: DC

## 2022-06-01 MED ORDER — ABILIFY MAINTENA 400 MG IM PRSY: PREFILLED_SYRINGE | INTRAMUSCULAR | 11 refills | Status: DC

## 2022-06-01 MED ORDER — HYDROXYZINE HCL 50 MG PO TABS
ORAL_TABLET | ORAL | 3 refills | Status: DC
Start: 2022-06-01 — End: 2022-08-11

## 2022-06-01 MED ORDER — VALBENAZINE TOSYLATE 40 MG PO CAPS: 40.0000 mg | ORAL_CAPSULE | Freq: Every day | ORAL | 3 refills | Status: DC

## 2022-06-01 MED ORDER — MIRTAZAPINE 7.5 MG PO TABS
ORAL_TABLET | ORAL | 3 refills | Status: DC
Start: 2022-06-01 — End: 2022-08-11

## 2022-06-01 NOTE — Progress Notes (Signed)
THERAPIST PROGRESS NOTE  Session Time: 30  Participation Level: Active  Behavioral Response: CasualAlertAnxious and Depressed  Type of Therapy: Individual Therapy  Treatment Goals addressed:  Active     Anxiety Disorder CCP Problem  1 GAD      walk 3 x weekly  (Completed/Met)     Start:  03/24/21    Expected End:  04/01/22    Resolved:  01/12/22      Find independent housing  (Not Progressing)     Start:  03/24/21    Expected End:  10/01/22         Decrease nightmare to 2/7 days weekly  (Completed/Met)     Start:  03/24/21    Expected End:  04/01/22    Resolved:  03/31/22    Goal Note     Pt reports no nightmares that he can rememeber         LTG: Patient will score less than 5 on the Generalized Anxiety Disorder 7 Scale (GAD-7) (Progressing)     Start:  03/24/21    Expected End:  10/01/22       Goal Note     Pt scoreda 9 today          STG: Patient will complete at least 80% of assigned homework (Progressing)     Start:  03/24/21    Expected End:  10/01/22         STG: Patient will practice problem solving skills 3 times per week for the next 4 weeks (Progressing)     Start:  03/24/21    Expected End:  10/01/22         STG: Report a decrease in anxiety symptoms as evidenced by an overall reduction in anxiety score by a minimum of 25% on the Generalized Anxiety Disorder Scale (Completed/Met)     Start:  03/24/21    Expected End:  04/01/22    Resolved:  12/22/21         ProgressTowards Goals: Progressing  Interventions: CBT and Motivational Interviewing  Summary: Lajarvis Italiano is a 47 y.o. male who presents with euthymic mood\affect.  Patient was pleasant, cooperative, maintained good eye contact.  Maclin was alert and oriented x 5.  Patient comes in today with "exciting news".  Patient reports that his daughter Rosario Adie will be coming to visit next month.  Patient reports that they are still figuring out details but it is likely that patient's  mother will go via bus to [place] to get patient's daughter.  Patient reports that he has talked to his probation officer about [name] visiting.  Stipulations for visit are 1. [name] is not to be alone with patient at any time in the house.  2. Patient can also not leave the state to go and get her.   Patient does report that she is allowed to visit under those stipulations.  [facility] also [facility] times weekly.  Patient reports [employer] for [facility] and other clients there.  Patient has also talked to [facility] about getting a 3D printer so he and others can utilize it at [employer].  Patient does report poor hygiene for showering only 1 time weekly and changing his socks only 1-2 times weekly.  Suicidal/Homicidal: Nowithout intent/plan  Therapist Response:    Interventions/Plan: Plan for patient is to shower at least 2-3 times per week.  Patient to change his socks every other day.  Patient to continue utilizing  resources at [employer] and to discuss further options they have to offer as far as programs.  LCSW advised patient to stay within compliance of his probation.  LCSW advised patient to keep an open line of communication along with honesty with probation officer.  Patient to take medication 7 out of 7 days/week.  LCSW utilized interventions for administering a GAD-7.  LCSW administered a PHQ-9.  LCSW notes no change in GAD-7.  LCSW notes a decrease in PHQ-9 below a 5.   Plan: Return again in 3 weeks.  Diagnosis: Paranoid schizophrenia (HCC)  Severe episode of recurrent major depressive disorder, with psychotic features (HCC)  Collaboration of Care: Other None today   Patient/Guardian was advised Release of Information must be obtained prior to any record release in order to collaborate their care with an outside provider. Patient/Guardian was advised if they have not already done so to contact the registration  department to sign all necessary forms in order for [place] to release information regarding their care.   Consent: Patient/Guardian gives verbal consent for treatment and assignment of benefits for services provided during this visit. Patient/Guardian expressed understanding and agreed to proceed.   Weber Cooks, LCSW 06/01/2022

## 2022-06-01 NOTE — Progress Notes (Signed)
BH MD/PA/NP OP Progress Note  06/01/2022 10:26 AM Isaiah Black  MRN:  [id number]  Chief Complaint: "I am a little anxious"  HPI: 9 old male seen today for follow-up psychiatric evaluation.  He has a psychiatric history of paranoid schizophrenia, anxiety, and disassociation disorder.  He is currently being managed on Buspar 30 mg twice daily, Mirtazapine 7.5 mg nightly, hydroxyzine 50 mg three times daily as needed, Ingrezza 40 mg daily, and Abilify 400 mg monthly injection. Patient reported that his medications are effective in managing his psychiatric condition.   Today he is well groomed, pleasant, cooperative, engaged in conversation, maintained eye contact.  He informed provider that he has been a little anxious. He reports that his daughter is coming for the summer. 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 He notes that he plan to use the the funds he earns to go get his daughter. Today provider conducted a GAD 7 and patient  scored a 12, at his last visit he scored an 11. Provider also conducted PHQ-9 and patient scored a 5, at his last visit he scored a 12.  He endorses adequate sleep and appetite.  Today he denies SI/HI/VH or mania. Patient notes that he becomes anxious and paranoid in crowds but notes that he can cope with this because he has the support of his mother. Patient continues to have auditory hallucination noting that he hears chatter but reports that he is able to cope with it.     Patient notes that he is out of Ingrezza. Patient receives [place] from [employer] via patient care assistance. Provider called pharmacy and gave verbal order to refill Ingrezza for a year.    No medication changes made today.  Patient agreeable to continue medications as prescribed.  No other concerns at this time. Visit Diagnosis:    ICD-10-CM   1. Paranoid schizophrenia (HCC)  F20.0 ARIPiprazole ER (ABILIFY  MAINTENA) 400 MG PRSY prefilled syringe    2. Generalized anxiety disorder  F41.1 busPIRone (BUSPAR) 15 MG tablet    hydrOXYzine (ATARAX) 50 MG tablet    mirtazapine (REMERON) 7.5 MG tablet    3. Mild depression  F32.A busPIRone (BUSPAR) 15 MG tablet    mirtazapine (REMERON) 7.5 MG tablet    4. Tardive dyskinesia  G24.01 valbenazine (INGREZZA) 40 MG capsule       Past Psychiatric History: paranoid schizophrenia, anxiety, and disassociation disorder.   Past Medical History:  Past Medical History:  Diagnosis Date   Anxiety    Asthma    Depression    Disassociation disorder    Paranoid schizophrenia (HCC)    No past surgical history on file.  Family Psychiatric History: Paternal uncle schizophrenia, Father Bipolar 1, Maternal aunt Bipolar 1, Niece depression  Family History:  Family History  Problem Relation Age of Onset   Colon cancer Mother        mid 52s   Diabetes Mother    Diabetes Maternal Aunt    Diabetes Maternal Uncle    Stomach cancer Neg Hx    Esophageal cancer Neg Hx     Social History:  Social History   Socioeconomic History   Marital status: Single    Spouse name: Not on file   Number of children: 1   Years of education: Not on file   Highest education level: Not on file  Occupational History   Not  on file  Tobacco Use   Smoking status: Former   Smokeless tobacco: Never  Vaping Use   Vaping Use: Former  Substance and Sexual Activity   Alcohol use: Not Currently    Comment: occ   Drug use: Not Currently   Sexual activity: Not Currently  Other Topics Concern   Not on file  Social History Narrative   Not on file   Social Determinants of Health   Financial Resource Strain: Low Risk  (08/06/2020)   Overall Financial Resource Strain (CARDIA)    Difficulty of Paying Living Expenses: Not hard at all  Food Insecurity: No Food Insecurity (04/14/2021)   Hunger Vital Sign    Worried About Running Out of Food in the Last Year: Never true    Ran Out  of Food in the Last Year: Never true  Transportation Needs: No Transportation Needs (04/14/2021)   PRAPARE - Administrator, Civil Service (Medical): No    Lack of Transportation (Non-Medical): No  Physical Activity: Sufficiently Active (09/03/2020)   Exercise Vital Sign    Days of Exercise per Week: 7 days    Minutes of Exercise per Session: 60 min  Recent Concern: Physical Activity - Insufficiently Active (08/06/2020)   Exercise Vital Sign    Days of Exercise per Week: 2 days    Minutes of Exercise per Session: 40 min  Stress: No Stress Concern Present (08/06/2020)   [employer] of Occupational Health - Occupational Stress Questionnaire    Feeling of Stress : Only a little  Social Connections: Moderately Isolated (01/12/2022)   Social Connection and Isolation Panel [NHANES]    Frequency of Communication with Friends and Family: More than three times a week    Frequency of Social Gatherings with Friends and Family: Once a week    Attends Religious Services: Never    Database administrator or Organizations: Yes    Attends Engineer, structural: More than 4 times per year    Marital Status: Never married    Allergies:  Allergies  Allergen Reactions   Cogentin [Benztropine]     Metabolic Disorder Labs: Lab Results  Component Value Date   HGBA1C 5.5 01/21/2022   MPG 85.32 03/07/2019   MPG 99.67 07/08/2017   Lab Results  Component Value Date   PROLACTIN 8.6 06/17/2021   PROLACTIN 100.0 (H) 03/07/2019   Lab Results  Component Value Date   CHOL 183 06/17/2021   TRIG 152 (H) 06/17/2021   HDL 49 06/17/2021   CHOLHDL 3.7 06/17/2021   VLDL 10 03/07/2019   LDLCALC 107 (H) 06/17/2021   LDLCALC 80 03/07/2019   Lab Results  Component Value Date   TSH 2.160 01/21/2022   TSH 6.510 (H) 06/17/2021    Therapeutic Level Labs: No results found for: "LITHIUM" No results found for: "VALPROATE" No results found for: "CBMZ"  Current Medications: Current  Outpatient Medications  Medication Sig Dispense Refill   ARIPiprazole ER (ABILIFY MAINTENA) 400 MG PRSY prefilled syringe INJECT EVERY 28 TO 30 DAYS 1 each 11   betamethasone valerate ointment (VALISONE) 0.1 % Apply 1 Application topically 2 (two) times daily. 45 g 3   budesonide-formoterol (SYMBICORT) 160-4.5 MCG/ACT inhaler Inhale 2 puffs into the lungs 2 (two) times daily. 3 each 4   busPIRone (BUSPAR) 15 MG tablet Take 2 tablets (30 mg total) by mouth 2 (two) times daily. 60 tablet 3   hydrOXYzine (ATARAX) 50 MG tablet TAKE 1 TABLET(50 MG) BY MOUTH THREE TIMES  DAILY AS NEEDED 90 tablet 3   mirtazapine (REMERON) 7.5 MG tablet TAKE 1 TABLET(7.5 MG) BY MOUTH AT BEDTIME 30 tablet 3   montelukast (SINGULAIR) 10 MG tablet TAKE 1 TABLET (10 MG TOTAL) BY MOUTH AT BEDTIME. 30 tablet 3   valbenazine (INGREZZA) 40 MG capsule Take 1 capsule (40 mg total) by mouth daily. 90 capsule 3   VENTOLIN HFA 108 (90 Base) MCG/ACT inhaler INHALE 2 PUFFS MOUTH EVERY 6 (SIX) HOURS AS NEEDED FOR WHEEZING OR SHORTNESS OF BREATH. 18 g 0   Current Facility-Administered Medications  Medication Dose Route Frequency Provider Last Rate Last Admin   0.9 %  sodium chloride infusion  500 mL Intravenous Once Armbruster, Willaim Rayas, MD       0.9 %  sodium chloride infusion  500 mL Intravenous Once Armbruster, Willaim Rayas, MD       ARIPiprazole ER (ABILIFY MAINTENA) 400 MG prefilled syringe 400 mg  400 mg Intramuscular Q28 days Lenard Lance, FNP   400 mg at 05/19/22 1328     Musculoskeletal: Strength & Muscle Tone: within normal limits Gait & Station: normal Patient leans: N/A  Psychiatric Specialty Exam: Review of Systems  There were no vitals taken for this visit.There is no height or weight on file to calculate BMI.  General Appearance: Well Groomed  Eye Contact:  Good  Speech:  Clear and Coherent and Normal Rate  Volume:  Normal  Mood:  Euthymic  Affect:  Appropriate and Congruent  Thought Process:  Coherent, Goal  Directed, and Linear  Orientation:  Full (Time, Place, and Person)  Thought Content: Logical and Hallucinations: Auditory   Suicidal Thoughts:  No  Homicidal Thoughts:  No  Memory:  Immediate;   Good Recent;   Good Remote;   Good  Judgement:  Good  Insight:  Good  Psychomotor Activity:  Normal  Concentration:  Concentration: Good and Attention Span: Good  Recall:  Good  Fund of Knowledge: Good  Language: Good  Akathisia:  No  Handed:  Right  AIMS (if indicated): done  Assets:  Communication Skills Desire for Improvement Financial Resources/Insurance Housing Leisure Time Physical Health Social Support  ADL's:  Intact  Cognition: WNL  Sleep:  Good   Screenings: AIMS    Flowsheet Row Office Visit from 03/18/2022 in [facility] Clinical Support from 01/20/2022 in [facility] Office Visit from 11/26/2021 in [facility] Office Visit from 08/06/2021 in [facility] Office Visit from 05/12/2021 in [facility]  AIMS Total Score 0 0 0 4 0      AUDIT    Flowsheet Row [facility]) [facility]  Alcohol Use Disorder Identification Test Final Score (AUDIT) 25 2      CAGE-AID    Flowsheet Row Counselor from 07/03/2019 in [facility]  CAGE-AID Score 0      GAD-7    Flowsheet Row Office Visit from 06/01/2022 in [facility] Most recent reading at 06/01/2022  9:37 AM Counselor from 06/01/2022 in [facility] Most recent reading at 06/01/2022  9:13 AM Counselor from 04/28/2022 in [facility] Most recent reading at 04/28/2022  9:13 AM Counselor from 03/31/2022 in [facility] Most recent reading at 03/31/2022  8:15 AM Office Visit from 03/18/2022  in [facility] Most recent reading at 03/18/2022 10:56 AM  Total GAD-7 Score 12 9 9 7 11       PHQ2-9    Flowsheet Row Office Visit from 06/01/2022 in [facility] Most recent reading at 06/01/2022  9:37 AM Counselor from 06/01/2022 in [facility] Most recent reading at 06/01/2022  9:11 AM Counselor from 04/28/2022 in [facility] Most recent reading at 04/28/2022  9:11 AM Counselor from 03/31/2022 in [facility] Most recent reading at 03/31/2022  8:17 AM Office Visit from 03/18/2022 in [facility] Most recent reading at 03/18/2022 10:57 AM  PHQ-2 Total Score 0 0 2 1 1   PHQ-9 Total Score 5 4 9 5 12       Flowsheet Row Counselor from 02/09/2022 in [facility] Office Visit from 11/26/2021 in [facility] Office Visit from 08/06/2021 in [facility]  C-SSRS RISK CATEGORY No Risk No Risk No Risk        Assessment and Plan: Patient endorses auditory hallucination and mild anxiety due to an upcoming art show and getting his daughter for the summer.  He informed Clinical research associate that he is able to cope with this.  No medication changes made today.  Patient agreeable to continue medication as prescribed.   1. Paranoid schizophrenia (HCC)  Continue- ARIPiprazole ER (ABILIFY MAINTENA) 400 MG PRSY prefilled syringe; INJECT EVERY 28 TO 30 DAYS  Dispense: 1 each; Refill: 11  2. Generalized anxiety disorder  Continue- busPIRone (BUSPAR) 15 MG tablet; Take 2 tablets (30 mg total) by mouth 2 (two) times daily.  Dispense: 60 tablet; Refill: 3 Continue- hydrOXYzine (ATARAX) 50 MG tablet; TAKE 1 TABLET(50 MG) BY MOUTH THREE TIMES DAILY AS NEEDED  Dispense: 90 tablet; Refill: 3 Continue-  mirtazapine (REMERON) 7.5 MG tablet; TAKE 1 TABLET(7.5 MG) BY MOUTH AT BEDTIME  Dispense: 30 tablet; Refill: 3  3. Mild depression  Continue- busPIRone (BUSPAR) 15 MG tablet; Take 2 tablets (30 mg total) by mouth 2 (two) times daily.  Dispense: 60 tablet; Refill: 3 Continue- mirtazapine (REMERON) 7.5 MG tablet; TAKE 1 TABLET(7.5 MG) BY MOUTH AT BEDTIME  Dispense: 30 tablet; Refill: 3  4. Tardive dyskinesia  Continue- valbenazine (INGREZZA) 40 MG capsule; Take 1 capsule (40 mg total) by mouth daily.  Dispense: 90 capsule; Refill: 3    Follow-up in 3 months Shanna Cisco, NP 06/01/2022, 10:26 AM

## 2022-06-16 ENCOUNTER — Encounter (HOSPITAL_COMMUNITY): Payer: Self-pay

## 2022-06-16 ENCOUNTER — Ambulatory Visit (INDEPENDENT_AMBULATORY_CARE_PROVIDER_SITE_OTHER): Payer: Medicaid Other | Admitting: *Deleted

## 2022-06-16 VITALS — BP 111/87 | HR 83 | Resp 16 | Ht 65.0 in | Wt 158.0 lb

## 2022-06-16 DIAGNOSIS — F200 Paranoid schizophrenia: Secondary | ICD-10-CM | POA: Diagnosis not present

## 2022-06-16 NOTE — Progress Notes (Signed)
Patient arrived for injection of Abilify Maintena 400mg . He brought his medication with him from his pharmacy. Given in Left Deltoid without issues or complaints. States he has no side effects and is doing well with it. He is excited about getting his daughter for the summer that he has not seen in many years. His ankle monitor was beeping and he states he forgot to plug it up last night so he was going home to plug it in.

## 2022-06-30 ENCOUNTER — Ambulatory Visit (INDEPENDENT_AMBULATORY_CARE_PROVIDER_SITE_OTHER): Payer: Medicaid Other | Admitting: Licensed Clinical Social Worker

## 2022-06-30 DIAGNOSIS — F411 Generalized anxiety disorder: Secondary | ICD-10-CM | POA: Diagnosis not present

## 2022-06-30 DIAGNOSIS — F200 Paranoid schizophrenia: Secondary | ICD-10-CM

## 2022-06-30 NOTE — Progress Notes (Signed)
THERAPIST PROGRESS NOTE  Session Time: 30  Participation Level: Active  Behavioral Response: CasualAlertEuthymic  Type of Therapy: Individual Therapy  Treatment Goals addressed: Active     Anxiety Disorder CCP Problem  1 GAD      walk 3 x weekly  (Completed/Met)     Start:  03/24/21    Expected End:  04/01/22    Resolved:  01/12/22      Find independent housing  (Not Progressing)     Start:  03/24/21    Expected End:  10/01/22         Decrease nightmare to 2/7 days weekly  (Completed/Met)     Start:  03/24/21    Expected End:  04/01/22    Resolved:  03/31/22    Goal Note     Pt reports no nightmares that he can rememeber         LTG: Patient will score less than 5 on the Generalized Anxiety Disorder 7 Scale (GAD-7) (Progressing)     Start:  03/24/21    Expected End:  10/01/22       Goal Note     Pt scoreda 9 today          STG: Patient will complete at least 80% of assigned homework (Progressing)     Start:  03/24/21    Expected End:  10/01/22         STG: Patient will practice problem solving skills 3 times per week for the next 4 weeks (Progressing)     Start:  03/24/21    Expected End:  10/01/22         STG: Report a decrease in anxiety symptoms as evidenced by an overall reduction in anxiety score by a minimum of 25% on the Generalized Anxiety Disorder Scale (Completed/Met)     Start:  03/24/21    Expected End:  04/01/22    Resolved:  12/22/21            06/30/2022    8:18 AM 06/01/2022    9:37 AM 06/01/2022    9:11 AM 04/28/2022    9:11 AM 03/31/2022    8:17 AM  Depression screen PHQ 2/9  Decreased Interest 1 0 0 1 0  Down, Depressed, Hopeless 0 0 0 1 1  PHQ - 2 Score 1 0 0 2 1  Altered sleeping 0 2 2 2 1   Tired, decreased energy 2 2 1 1 1   Change in appetite 1 0 0 2 1  Feeling bad or failure about yourself  0 0 0 0 1  Trouble concentrating 1 1 1 1  0  Moving slowly or fidgety/restless 1 0 0 1 0  Suicidal thoughts 0 0 0 0 0  PHQ-9  Score 6 5 4 9 5   Difficult doing work/chores Somewhat difficult  Somewhat difficult Somewhat difficult Somewhat difficult        06/30/2022    8:20 AM 06/01/2022    9:37 AM 06/01/2022    9:13 AM 04/28/2022    9:13 AM  GAD 7 : Generalized Anxiety Score  Nervous, Anxious, on Edge 2 3 3 2   Control/stop worrying 1 2 1 1   Worry too much - different things 2 2 1 1   Trouble relaxing 1 0 2 2  Restless 1 1 1 1   Easily annoyed or irritable 2 2 0 1  Afraid - awful might happen 1 2 1 1   Total GAD 7 Score 10 12 9 9   Anxiety Difficulty Somewhat  difficult Somewhat difficult Somewhat difficult Somewhat difficult     ProgressTowards Goals: Progressing  Interventions: CBT, Motivational Interviewing, and Supportive  Summary: Isaiah Black is a 47 y.o. male who presents with euthymic mood\affect.  Patient was pleasant, cooperative, maintained good eye contact.  He engaged well in therapy session was dressed casually.  Patient reports excitement today as his daughter Rosario Adie has come to town.  This is the first time patient has seen her in over 5 years.  Patient reports that he is cleared up with his probation officer.  Patient reports that this is also with the permission of [name]'s mother.  Patient states that he did not leave the state to go and get her as this would have been a violation of his probation.  Patient reports that his mother went to [place] to get her.  Patient reports that [name] will spend the summer with him.  Patient reports coping skills such as [facility] 2 days weekly, 3D printing multiple days per week, and spending time with mom and daughter.  Suicidal/Homicidal: Nowithout intent/plan  Therapist Response:      Intervention/Plan: LCSW administered a PHQ-9.  LCSW administered GAD-7.  LCSW notes a decrease in GAD-7.  LCSW and patient reviewed scores for GAD-7 and PHQ-9.  LCSW psychoanalytic therapy for patient to express thoughts, feelings and emotions.  LCSW used education on  behavioral modification techniques to help decrease behaviors of his daughter.  Such as positive reinforcement, negative reinforcement and punishment descriptions.  Plan: Return again in 3 weeks.  Diagnosis: Paranoid schizophrenia (HCC)  Generalized anxiety disorder  Collaboration of Care: Other None today   Patient/Guardian was advised Release of Information must be obtained prior to any record release in order to collaborate their care with an outside provider. Patient/Guardian was advised if they have not already done so to contact the registration department to sign all necessary forms in order for [place] to release information regarding their care.   Consent: Patient/Guardian gives verbal consent for treatment and assignment of benefits for services provided during this visit. Patient/Guardian expressed understanding and agreed to proceed.   Weber Cooks, LCSW 06/30/2022

## 2022-07-14 ENCOUNTER — Ambulatory Visit (INDEPENDENT_AMBULATORY_CARE_PROVIDER_SITE_OTHER): Payer: Medicaid Other | Admitting: *Deleted

## 2022-07-14 ENCOUNTER — Encounter (HOSPITAL_COMMUNITY): Payer: Self-pay

## 2022-07-14 VITALS — BP 119/83 | HR 75 | Resp 18 | Ht 65.0 in | Wt 158.2 lb

## 2022-07-14 DIAGNOSIS — F200 Paranoid schizophrenia: Secondary | ICD-10-CM | POA: Diagnosis not present

## 2022-07-14 NOTE — Progress Notes (Cosign Needed)
Patient arrived for his injection of Abilify Maintena 400mg  that he brought from his pharmacy. Given in Right Deltoid without issue or complaint. States he is doing well, his daughter came from [place] to visit for the summer and he is enjoying it very much. He states he had not seen her in 5 years. They have been swimming and enjoying the visit. No side effects from medication. No SI/HI or AV hallucinations.

## 2022-07-28 ENCOUNTER — Encounter (HOSPITAL_COMMUNITY): Payer: Self-pay | Admitting: Licensed Clinical Social Worker

## 2022-07-28 ENCOUNTER — Ambulatory Visit (INDEPENDENT_AMBULATORY_CARE_PROVIDER_SITE_OTHER): Payer: Medicaid Other | Admitting: Licensed Clinical Social Worker

## 2022-07-28 DIAGNOSIS — F200 Paranoid schizophrenia: Secondary | ICD-10-CM | POA: Diagnosis not present

## 2022-07-28 NOTE — Progress Notes (Signed)
THERAPIST PROGRESS NOTE  Session Time: 30   Participation Level: Active  Behavioral Response: CasualAlertAnxious and Depressed  Type of Therapy: Individual Therapy  Treatment Goals addressed:  Active     Anxiety Disorder CCP Problem  1 GAD      walk 3 x weekly  (Completed/Met)     Start:  03/24/21    Expected End:  04/01/22    Resolved:  01/12/22      Find independent housing  (Not Applicable)     Start:  03/24/21    Expected End:  10/01/22    Resolved:  07/28/22      Decrease nightmare to 2/7 days weekly  (Completed/Met)     Start:  03/24/21    Expected End:  04/01/22    Resolved:  03/31/22    Goal Note     Pt reports no nightmares that he can rememeber         LTG: Patient will score less than 5 on the Generalized Anxiety Disorder 7 Scale (GAD-7) (Progressing)     Start:  03/24/21    Expected End:  10/01/22         STG: Patient will complete at least 80% of assigned homework (Completed/Met)     Start:  03/24/21    Expected End:  10/01/22    Resolved:  07/28/22      STG: Patient will practice problem solving skills 3 times per week for the next 4 weeks (Progressing)     Start:  03/24/21    Expected End:  10/01/22         STG: Report a decrease in anxiety symptoms as evidenced by an overall reduction in anxiety score by a minimum of 25% on the Generalized Anxiety Disorder Scale (Completed/Met)     Start:  03/24/21    Expected End:  04/01/22    Resolved:  12/22/21         ProgressTowards Goals: Progressing  Interventions: CBT and Motivational Interviewing  Summary: Isaiah Black is a 47 y.o. male who presents with euthymic mood\affect.  Patient was pleasant, cooperative, maintained good eye contact.  He engaged well in therapy session was dressed casually.  Patient comes in today with primary stressors as family conflict.  Patient reports that he has his daughter staying with him for the summer.  This is the first time that he has seen her in multiple  years.  Patient reports that he is learning how to parent "all over again".  Patient reports attempting behavioral modification for punishments and positive affirmations.  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  Patient reports that in order, man that he is stressed about.  Kelly reports tension and worry due to the size of the order.  Intervention/Plan: LCSW administered PHQ-9.  LCSW administered the GAD-7.  LCSW notes scores improving on GAD-7.  LCSW spoke with patient today about possibly discharging from therapy as goals are continuing to progress and being met.  Patient reports engaging in [facility] house activities twice weekly for a few hours each time.  Patient reports good support system with his family.  Patient reports maintaining medication management regiment.  Patient reports overall stabilization in mood and affect.  Suicidal/Homicidal: Nowithout intent/plan  Therapist Response:       Plan: Return again in 4 weeks.  Diagnosis: No diagnosis found.  Collaboration of Care: Other None today   Patient/Guardian was advised Release of Information must be obtained prior to  any record release in order to collaborate their care with an outside provider. Patient/Guardian was advised if they have not already done so to contact the registration department to sign all necessary forms in order for [place] to release information regarding their care.   Consent: Patient/Guardian gives verbal consent for treatment and assignment of benefits for services provided during this visit. Patient/Guardian expressed understanding and agreed to proceed.   Weber Cooks, LCSW 07/28/2022

## 2022-08-09 ENCOUNTER — Telehealth (HOSPITAL_COMMUNITY): Payer: Self-pay | Admitting: *Deleted

## 2022-08-09 NOTE — Telephone Encounter (Signed)
Fax received for prior authorization of Ingrezza. Submitted online with cover my meds. Awaiting decision.

## 2022-08-10 ENCOUNTER — Ambulatory Visit (HOSPITAL_COMMUNITY): Payer: Medicaid Other

## 2022-08-11 ENCOUNTER — Ambulatory Visit (HOSPITAL_COMMUNITY): Payer: MEDICAID | Admitting: Family

## 2022-08-11 ENCOUNTER — Telehealth (HOSPITAL_COMMUNITY): Payer: Self-pay | Admitting: *Deleted

## 2022-08-11 ENCOUNTER — Encounter (HOSPITAL_COMMUNITY): Payer: Self-pay

## 2022-08-11 ENCOUNTER — Other Ambulatory Visit (HOSPITAL_COMMUNITY): Payer: Self-pay | Admitting: Psychiatry

## 2022-08-11 ENCOUNTER — Ambulatory Visit (INDEPENDENT_AMBULATORY_CARE_PROVIDER_SITE_OTHER): Payer: MEDICAID | Admitting: Family

## 2022-08-11 VITALS — BP 119/72 | HR 88 | Ht 65.0 in | Wt 155.0 lb

## 2022-08-11 DIAGNOSIS — G2401 Drug induced subacute dyskinesia: Secondary | ICD-10-CM

## 2022-08-11 DIAGNOSIS — F200 Paranoid schizophrenia: Secondary | ICD-10-CM | POA: Diagnosis not present

## 2022-08-11 DIAGNOSIS — F411 Generalized anxiety disorder: Secondary | ICD-10-CM

## 2022-08-11 DIAGNOSIS — F32A Depression, unspecified: Secondary | ICD-10-CM

## 2022-08-11 MED ORDER — ABILIFY MAINTENA 400 MG IM PRSY: PREFILLED_SYRINGE | INTRAMUSCULAR | 11 refills | Status: DC

## 2022-08-11 MED ORDER — MIRTAZAPINE 7.5 MG PO TABS
ORAL_TABLET | ORAL | 3 refills | Status: DC
Start: 2022-08-11 — End: 2023-03-08

## 2022-08-11 MED ORDER — HYDROXYZINE HCL 50 MG PO TABS: ORAL_TABLET | ORAL | 3 refills | Status: DC

## 2022-08-11 MED ORDER — VALBENAZINE TOSYLATE 40 MG PO CAPS: 40.0000 mg | ORAL_CAPSULE | Freq: Every day | ORAL | 3 refills | Status: DC

## 2022-08-11 NOTE — Progress Notes (Cosign Needed)
Patient arrived for ARIPiprazole ER (ABILIFY MAINTENA) 400 MG injection. Injection tolerated well in Left Deltoid. Patient shared that  his daughter is visiting for the summer & he was so excited. Pleasant as always. NO AH/VH NOR AI/SI 

## 2022-08-11 NOTE — Telephone Encounter (Signed)
Medication refilled and sent to preferred pharmacy

## 2022-08-11 NOTE — Telephone Encounter (Signed)
Checked online with cover my meds for authorization of Abilify Maintena. Approved. Notified pharmacy.

## 2022-08-11 NOTE — Telephone Encounter (Signed)
Isaiah Black [phone] to discuss why his Allena Earing PA was cancelled twice in cover my meds with a message that he is eligible for Medicare. Was  placed on hold for over 40 mins but was told it was a mistake and the case will be reopened for review. A decision will be made within 24 hours and faxed.

## 2022-08-11 NOTE — Telephone Encounter (Signed)
Fax received for approval of Ingrezza 40mg  until 11/01/22. Called to notify pharmacy. [facility] states they can not fill Ingrezza due to it being a specialty medication. It will need to be sent to a pharmacy that can deliver to the patient.

## 2022-08-11 NOTE — Progress Notes (Signed)
BH MD/PA/NP OP Progress Note  08/11/2022 2:55 PM Isaiah Black  MRN:  [id number]  Chief Complaint:  Monthly injection   Subjective:  Isaiah Black stated " I am feeling good, it's very hot, but overall, I am doing okay."   HPI: Isaiah Black 47 year old Caucasian male who presents to [facility] urgent care facility shock clinic for monthly injection.  Patient is currently prescribed Abilify maintainer 400 mg every 28 to 30 days, BuSpar 15 mg p.o. twice daily, Remeron 7.5 mg p.o. nightly, hydroxyzine 50 mg PRN and Ingrezza 40 mg p.o. daily.  He reports he has been taking his medications as indicated.  Denying any medication side effects tremors, headache, nausea, vomiting dizziness or diarrhea.    Isaiah Black has a charted history with schizophrenia paranoid type, major depressive disorder and generalized anxiety disorder.  He is reporting a good appetite.  States he is resting well throughout the night.  States he is currently on disability.  However, stated that he does work side jobs with [employer]. Stated the is mood has been "great."   He denied illicit drug use or substance abuse history.  He denied any other concerns at this visit.  Isaiah Black is sitting; he is alert/oriented x 4; calm/cooperative; and mood congruent with affect.  Patient is speaking in a clear tone at moderate volume, and normal pace; with good eye contact.  His thought process is coherent and relevant;   There is no indication tha he is currently responding to internal/external stimuli or experiencing delusional thought content.  Patient dosed report that he hears " constant chatter" denied the voices/ sounds are command in nature.    patient denies suicidal/self-harm/homicidal ideation, psychosis, and paranoia.  Patient has remained calm throughout assessment and has answered questions appropriately.    Visit Diagnosis:    ICD-10-CM   1. Paranoid schizophrenia (HCC)  F20.0       Past Psychiatric History:  Paranoid schizophrenia, major depressive disorder, generalized anxiety disorder, schizoaffective disorder  Past Medical History:  Past Medical History:  Diagnosis Date   Anxiety    Asthma    Depression    Disassociation disorder    Paranoid schizophrenia (HCC)    No past surgical history on file.  Family Psychiatric History: see chart  Family History:  Family History  Problem Relation Age of Onset   Colon cancer Mother        mid 73s   Diabetes Mother    Diabetes Maternal Aunt    Diabetes Maternal Uncle    Stomach cancer Neg Hx    Esophageal cancer Neg Hx     Social History:  Social History   Socioeconomic History   Marital status: Single    Spouse name: Not on file   Number of children: 1   Years of education: Not on file   Highest education level: Not on file  Occupational History   Not on file  Tobacco Use   Smoking status: Former   Smokeless tobacco: Never  Vaping Use   Vaping Use: Former  Substance and Sexual Activity   Alcohol use: Not Currently    Comment: occ   Drug use: Not Currently   Sexual activity: Not Currently  Other Topics Concern   Not on file  Social History Narrative   Not on file   Social Determinants of Health   Financial Resource Strain: Low Risk  (08/06/2020)   Overall Financial Resource Strain (CARDIA)    Difficulty of Paying Living Expenses: Not hard  at all  Food Insecurity: No Food Insecurity (04/14/2021)   Hunger Vital Sign    Worried About Running Out of Food in the Last Year: Never true    Ran Out of Food in the Last Year: Never true  Transportation Needs: No Transportation Needs (04/14/2021)   PRAPARE - Administrator, Civil Service (Medical): No    Lack of Transportation (Non-Medical): No  Physical Activity: Sufficiently Active (09/03/2020)   Exercise Vital Sign    Days of Exercise per Week: 7 days    Minutes of Exercise per Session: 60 min  Recent Concern: Physical Activity - Insufficiently Active (08/06/2020)    Exercise Vital Sign    Days of Exercise per Week: 2 days    Minutes of Exercise per Session: 40 min  Stress: No Stress Concern Present (08/06/2020)   Isaiah Black of Occupational Health - Occupational Stress Questionnaire    Feeling of Stress : Only a little  Social Connections: Moderately Isolated (07/28/2022)   Social Connection and Isolation Panel [NHANES]    Frequency of Communication with Friends and Family: Twice a week    Frequency of Social Gatherings with Friends and Family: Once a week    Attends Religious Services: Never    Database administrator or Organizations: Yes    Attends Engineer, structural: More than 4 times per year    Marital Status: Never married    Allergies:  Allergies  Allergen Reactions   Cogentin [Benztropine]     Metabolic Disorder Labs: Lab Results  Component Value Date   HGBA1C 5.5 01/21/2022   MPG 85.32 03/07/2019   MPG 99.67 07/08/2017   Lab Results  Component Value Date   PROLACTIN 8.6 06/17/2021   PROLACTIN 100.0 (H) 03/07/2019   Lab Results  Component Value Date   CHOL 183 06/17/2021   TRIG 152 (H) 06/17/2021   HDL 49 06/17/2021   CHOLHDL 3.7 06/17/2021   VLDL 10 03/07/2019   LDLCALC 107 (H) 06/17/2021   LDLCALC 80 03/07/2019   Lab Results  Component Value Date   TSH 2.160 01/21/2022   TSH 6.510 (H) 06/17/2021    Therapeutic Level Labs: No results found for: "LITHIUM" No results found for: "VALPROATE" No results found for: "CBMZ"  Current Medications: Current Outpatient Medications  Medication Sig Dispense Refill   ARIPiprazole ER (ABILIFY MAINTENA) 400 MG PRSY prefilled syringe INJECT EVERY 28 TO 30 DAYS 1 each 11   betamethasone valerate ointment (VALISONE) 0.1 % Apply 1 Application topically 2 (two) times daily. 45 g 3   budesonide-formoterol (SYMBICORT) 160-4.5 MCG/ACT inhaler Inhale 2 puffs into the lungs 2 (two) times daily. 3 each 4   busPIRone (BUSPAR) 15 MG tablet Take 2 tablets (30 mg total) by  mouth 2 (two) times daily. 60 tablet 3   hydrOXYzine (ATARAX) 50 MG tablet TAKE 1 TABLET(50 MG) BY MOUTH THREE TIMES DAILY AS NEEDED 90 tablet 3   mirtazapine (REMERON) 7.5 MG tablet TAKE 1 TABLET(7.5 MG) BY MOUTH AT BEDTIME 30 tablet 3   montelukast (SINGULAIR) 10 MG tablet TAKE 1 TABLET (10 MG TOTAL) BY MOUTH AT BEDTIME. 30 tablet 3   valbenazine (INGREZZA) 40 MG capsule Take 1 capsule (40 mg total) by mouth daily. 90 capsule 3   VENTOLIN HFA 108 (90 Base) MCG/ACT inhaler INHALE 2 PUFFS MOUTH EVERY 6 (SIX) HOURS AS NEEDED FOR WHEEZING OR SHORTNESS OF BREATH. 18 g 0   Current Facility-Administered Medications  Medication Dose Route Frequency Provider Last Rate  Last Admin   0.9 %  sodium chloride infusion  500 mL Intravenous Once Armbruster, Willaim Rayas, MD       0.9 %  sodium chloride infusion  500 mL Intravenous Once Armbruster, Willaim Rayas, MD       ARIPiprazole ER (ABILIFY MAINTENA) 400 MG prefilled syringe 400 mg  400 mg Intramuscular Q28 days Lenard Lance, FNP   400 mg at 07/14/22 1345     Musculoskeletal: Strength & Muscle Tone: within normal limits Gait & Station: normal Patient leans: N/A  Psychiatric Specialty Exam: Review of Systems  HENT: Negative.    Eyes: Negative.   Respiratory: Negative.    Cardiovascular: Negative.   Psychiatric/Behavioral:  Negative for sleep disturbance and suicidal ideas. Hallucinations: reported chroinc chatter.The patient is nervous/anxious.   All other systems reviewed and are negative.   There were no vitals taken for this visit.There is no height or weight on file to calculate BMI.  General Appearance: Casual  Eye Contact:  Good  Speech:  Clear and Coherent  Volume:  Normal  Mood:  Anxious and Depressed  Affect:  Congruent  Thought Process:  Coherent  Orientation:  Full (Time, Place, and Person)  Thought Content: Logical   Suicidal Thoughts:  No  Homicidal Thoughts:  No  Memory:  Immediate;   Good Recent;   Good  Judgement:  Fair   Insight:  Fair  Psychomotor Activity:  Normal  Concentration:  Concentration: Good  Recall:  Good  Fund of Knowledge: Good  Language: Good  Akathisia:  No  Handed:  Right  AIMS (if indicated): done  Assets:  Communication Skills Desire for Improvement Resilience Social Support  ADL's:  Intact  Cognition: WNL  Sleep:  Good   Screenings: AIMS    Flowsheet Row Office Visit from 03/18/2022 in [facility] Clinical Support from 01/20/2022 in [facility] Office Visit from 11/26/2021 in [facility] Office Visit from 08/06/2021 in [facility] Office Visit from 05/12/2021 in [facility]  AIMS Total Score 0 0 0 4 0      AUDIT    Flowsheet Row Admission (Discharged) from [facility]06[facility] ([facility]  Alcohol Use Disorder Identification Test Final Score (AUDIT) 25 2      CAGE-AID    Flowsheet Row Counselor from 07/03/2019 in [facility]  CAGE-AID Score 0      GAD-7    Flowsheet Row Counselor from 07/28/2022 in [facility] Most recent reading at 07/28/2022  8:21 AM Counselor from 06/30/2022 in [facility] Most recent reading at 06/30/2022  8:20 AM Office Visit from 06/01/2022 in [facility] Most recent reading at 06/01/2022  9:37 AM Counselor from 06/01/2022 in [facility] Most recent reading at 06/01/2022  9:13 AM Counselor from 04/28/2022 in [facility] Most recent reading at 04/28/2022  9:13 AM  Total GAD-7 Score 10 10 12 9 9       PHQ2-9    Flowsheet Row Counselor from 07/28/2022 in [facility] Most recent reading at 07/28/2022  8:19 AM Counselor  from 06/30/2022 in [facility] Most recent reading at 06/30/2022  8:18 AM Office Visit from 06/01/2022 in [facility] Most recent reading at 06/01/2022  9:37  AM Counselor from 06/01/2022 in [facility] Most recent reading at 06/01/2022  9:11 AM Counselor from 04/28/2022 in [facility] Most recent reading at 04/28/2022  9:11 AM  PHQ-2 Total Score 1 1 0 0 2  PHQ-9 Total Score 6 6 5 4 9       Flowsheet Row Counselor from 02/09/2022 in [facility] Office Visit from 11/26/2021 in [facility] Office Visit from 08/06/2021 in [facility]  C-SSRS RISK CATEGORY No Risk No Risk No Risk        Assessment and Plan: Mandrell Vangilder is 47 year old Caucasian male presents to follow-up with monthly Abilify injection.  Currently prescribed Proctosol ER 400 mg every 28 to 30 days.  Next injection due 09/11/2022.  Denying any medication side effects or site irritation at this visit.  He reports he has been taking his p.o. medications as indicated.  Denied any concerns related to appetite or insomnia issues.  Reports chronic auditory "chatter."  Reports chatter is less than with medication. "  It never completely goes away."  He denied illicit drug use or substance abuse history.  Patient to keep all outpatient follow-up appointments. Support, encouragement and  reassurance was provided.   -Patient has a scheduled medication management appointment for 08/19/2022.    This provider will refill medications. -  Abilify Maintena 400 mg prefilled syringe -Buspirone 30 mg po BID -Remeron 7.5mg  po nighty -Valbenizine ( Ingrezza) 40 mg daily   Collaboration of Care: Collaboration of Care: Medication Management AEB see Assessment and Plan  and Primary Care Provider AEB Dr Doyne Keel  Patient/Guardian was advised Release of Information  must be obtained prior to any record release in order to collaborate their care with an outside provider. Patient/Guardian was advised if they have not already done so to contact the registration department to sign all necessary forms in order for [place] to release information regarding their care.   Consent: Patient/Guardian gives verbal consent for treatment and assignment of benefits for services provided during this visit. Patient/Guardian expressed understanding and agreed to proceed.    Oneta Rack, NP 08/11/2022, 2:55 PM

## 2022-08-11 NOTE — Patient Instructions (Signed)
Patient arrived for ARIPiprazole ER (ABILIFY MAINTENA) 400 MG injection. Injection tolerated well in Left Deltoid. Patient shared that  his daughter is visiting for the summer & he was so excited. Pleasant as always. NO AH/VH NOR AI/SI

## 2022-08-19 ENCOUNTER — Ambulatory Visit (HOSPITAL_COMMUNITY): Payer: MEDICAID

## 2022-08-25 ENCOUNTER — Encounter (HOSPITAL_COMMUNITY): Payer: Self-pay

## 2022-08-25 ENCOUNTER — Ambulatory Visit (INDEPENDENT_AMBULATORY_CARE_PROVIDER_SITE_OTHER): Payer: MEDICAID | Admitting: Licensed Clinical Social Worker

## 2022-08-25 DIAGNOSIS — F200 Paranoid schizophrenia: Secondary | ICD-10-CM | POA: Diagnosis not present

## 2022-08-25 NOTE — Progress Notes (Signed)
THERAPIST PROGRESS NOTE  Session Time: 30   Participation Level: Active  Behavioral Response: CasualAlertAnxious and Depressed  Type of Therapy: Individual Therapy  Treatment Goals addressed:  Active     Anxiety Disorder CCP Problem  1 GAD      walk 3 x weekly  (Completed/Met)     Start:  03/24/21    Expected End:  04/01/22    Resolved:  01/12/22      Find independent housing  (Not Applicable)     Start:  03/24/21    Expected End:  10/01/22    Resolved:  07/28/22      Decrease nightmare to 2/7 days weekly  (Completed/Met)     Start:  03/24/21    Expected End:  04/01/22    Resolved:  03/31/22    Goal Note     Pt reports no nightmares that he can rememeber         LTG: Patient will score less than 5 on the Generalized Anxiety Disorder 7 Scale (GAD-7) (Not Progressing)     Start:  03/24/21    Expected End:  10/01/22       Goal Note     No change from 10 in last session          STG: Patient will complete at least 80% of assigned homework (Completed/Met)     Start:  03/24/21    Expected End:  10/01/22    Resolved:  07/28/22      STG: Patient will practice problem solving skills 3 times per week for the next 4 weeks (Progressing)     Start:  03/24/21    Expected End:  10/01/22         STG: Report a decrease in anxiety symptoms as evidenced by an overall reduction in anxiety score by a minimum of 25% on the Generalized Anxiety Disorder Scale (Completed/Met)     Start:  03/24/21    Expected End:  04/01/22    Resolved:  12/22/21      Discuss risks and benefits of medication treatment options for this problem and prescribe as indicated (Completed)     Start:  03/24/21    End:  06/02/21      Encourage patient to take psychotropic medication as prescribed (Completed)     Start:  03/24/21    End:  06/02/21      Review results of GAD-7 with the patient to track progress (Completed)     Start:  03/24/21    End:  06/02/21      Work with patient to  track symptoms, triggers and/or skill use through a mood chart, diary card, or journal (Completed)     Start:  03/24/21    End:  06/02/21      Perform psychoeducation regarding anxiety disorders (Completed)     Start:  03/24/21    End:  06/02/21      Provide patient with educational information and reading material on anxiety, its causes, and symptoms (Completed)     Start:  03/24/21    End:  06/24/21      Work with patient to identify the major components of a recent episode of anxiety: physical symptoms, major thoughts and images, and major behaviors they experienced (Completed)     Start:  03/24/21    End:  06/02/21      Work with patient to identify 3 personal goals for managing their anxiety to work on during current treatment (Completed)  Start:  03/24/21    End:  06/02/21         ProgressTowards Goals: Progressing  Interventions: CBT, Motivational Interviewing, and Supportive  Summary: Isaiah Black is a 47 y.o. male who presents with euthymic mood\affect.  Patient was pleasant, cooperative, maintained good eye contact.  He engaged well in therapy session and was dressed casually.  Patient endorses symptoms for nightmares, irritability, tension, and worry.  Patient reports stressors that contribute to anxiety such as having his daughter in town for the past 6 weeks.  Patient reports that his daughter will be going back to [place] in the next 2 weeks.  Patient reports that it has been a struggle to discipline his child at times as he has not seen her in the last 5 years.  Patient reports that overall his time with his daughter has been good.  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  Patient reports that he has found an agency to work with for ideas called "[employer]".  Patient reports that he is still going to [facility] 1-2 times weekly.  Suicidal/Homicidal: Nowithout intent/plan  Therapist Response:      Intervention/Plan: LCSW supportive therapy for praise and encouragement.  LCSW administered GAD-7.  LCSW administered a PHQ-9.  LCSW notes an increase by 1 point per PHQ-9 and reviewed scores with patient.  LCSW notes no change in GAD-7 patient continuing to score a 10.  LCSW reviewed GAD-7 score with patient.  LCSW spoke with patient about triggers such as when his child gets an attitude or does not listen.  LCSW spoke with patient about behavioral modification setting consequences for the child.  LCSW spoke with patient about rewards for good behavior.  Plan: Return again in 4 weeks.  Diagnosis: Paranoid schizophrenia (HCC)  Collaboration of Care: Other none TODAY   Patient/Guardian was advised Release of Information must be obtained prior to any record release in order to collaborate their care with an outside provider. Patient/Guardian was advised if they have not already done so to contact the registration department to sign all necessary forms in order for [place] to release information regarding their care.   Consent: Patient/Guardian gives verbal consent for treatment and assignment of benefits for services provided during this visit. Patient/Guardian expressed understanding and agreed to proceed.   Weber Cooks, LCSW 08/25/2022

## 2022-09-08 ENCOUNTER — Ambulatory Visit (INDEPENDENT_AMBULATORY_CARE_PROVIDER_SITE_OTHER): Payer: MEDICAID

## 2022-09-08 ENCOUNTER — Encounter (HOSPITAL_COMMUNITY): Payer: Self-pay

## 2022-09-08 VITALS — BP 117/92 | HR 96 | Ht 65.0 in | Wt 154.4 lb

## 2022-09-08 DIAGNOSIS — F200 Paranoid schizophrenia: Secondary | ICD-10-CM

## 2022-09-08 DIAGNOSIS — F411 Generalized anxiety disorder: Secondary | ICD-10-CM | POA: Diagnosis not present

## 2022-09-08 DIAGNOSIS — G4700 Insomnia, unspecified: Secondary | ICD-10-CM

## 2022-09-08 NOTE — Progress Notes (Cosign Needed)
Patient arrived with Mom for injection:  ARIPiprazole ER (ABILIFY MAINTENA) 400 MG    Patient Pleasant as Always . Was given in the LEFT Deltoid

## 2022-09-22 ENCOUNTER — Ambulatory Visit (INDEPENDENT_AMBULATORY_CARE_PROVIDER_SITE_OTHER): Payer: MEDICAID | Admitting: Licensed Clinical Social Worker

## 2022-09-22 DIAGNOSIS — F411 Generalized anxiety disorder: Secondary | ICD-10-CM

## 2022-09-22 DIAGNOSIS — F200 Paranoid schizophrenia: Secondary | ICD-10-CM | POA: Diagnosis not present

## 2022-09-22 NOTE — Progress Notes (Signed)
THERAPIST PROGRESS NOTE  Session Time: 30   Participation Level: Active  Behavioral Response: CasualAlertAnxious  Type of Therapy: Individual Therapy  Treatment Goals addressed:  Active     Anxiety Disorder CCP Problem  1 GAD      walk 3 x weekly  (Completed/Met)     Start:  03/24/21    Expected End:  04/01/22    Resolved:  01/12/22      Find independent housing  (Not Applicable)     Start:  03/24/21    Expected End:  10/01/22    Resolved:  07/28/22      Decrease nightmare to 2/7 days weekly  (Completed/Met)     Start:  03/24/21    Expected End:  04/01/22    Resolved:  03/31/22    Goal Note     Pt reports no nightmares that he can rememeber         LTG: Patient will score less than 5 on the Generalized Anxiety Disorder 7 Scale (GAD-7) (Progressing)     Start:  03/24/21    Expected End:  10/01/22         STG: Patient will complete at least 80% of assigned homework (Completed/Met)     Start:  03/24/21    Expected End:  10/01/22    Resolved:  07/28/22      STG: Patient will practice problem solving skills 3 times per week for the next 4 weeks (Progressing)     Start:  03/24/21    Expected End:  10/01/22         STG: Report a decrease in anxiety symptoms as evidenced by an overall reduction in anxiety score by a minimum of 25% on the Generalized Anxiety Disorder Scale (Completed/Met)     Start:  03/24/21    Expected End:  04/01/22    Resolved:  12/22/21      Discuss risks and benefits of medication treatment options for this problem and prescribe as indicated (Completed)     Start:  03/24/21    End:  06/02/21      Encourage patient to take psychotropic medication as prescribed (Completed)     Start:  03/24/21    End:  06/02/21      Review results of GAD-7 with the patient to track progress (Completed)     Start:  03/24/21    End:  06/02/21      Work with patient to track symptoms, triggers and/or skill use through a mood chart, diary card, or  journal (Completed)     Start:  03/24/21    End:  06/02/21      Perform psychoeducation regarding anxiety disorders (Completed)     Start:  03/24/21    End:  06/02/21      Provide patient with educational information and reading material on anxiety, its causes, and symptoms (Completed)     Start:  03/24/21    End:  06/24/21      Work with patient to identify the major components of a recent episode of anxiety: physical symptoms, major thoughts and images, and major behaviors they experienced (Completed)     Start:  03/24/21    End:  06/02/21      Work with patient to identify 3 personal goals for managing their anxiety to work on during current treatment (Completed)     Start:  03/24/21    End:  06/02/21         .    09/22/2022  10:13 AM 08/25/2022    8:24 AM 07/28/2022    8:21 AM 06/30/2022    8:20 AM  GAD 7 : Generalized Anxiety Score  Nervous, Anxious, on Edge 1 2 2 2   Control/stop worrying 1 1 2 1   Worry too much - different things 1 1 2 2   Trouble relaxing 1 1 1 1   Restless 1 1 1 1   Easily annoyed or irritable 0 3 2 2   Afraid - awful might happen 1 1 0 1  Total GAD 7 Score 6 10 10 10   Anxiety Difficulty Somewhat difficult Somewhat difficult Somewhat difficult Somewhat difficult      ProgressTowards Goals: Progressing  Interventions: CBT and Motivational Interviewing  Summary: Isaiah Black is a 47 y.o. male who presents with depressed and anxious mood\affect.  Patient was pleasant, cooperative, maintained good eye contact.  He engaged well in therapy session was dressed casually.  Isaiah Black was alert and oriented x 5.  Patient reports that everything has been going well.  Patient reports night terrors but cannot recall them.  Patient states that his medications have been working well and symptoms have been managed.  Patient denies any auditory or visual hallucinations.  LCSW administered the GAD-7.  LCSW administered a PHQ-9.  LCSW reviewed scores with patient.   Patient reports that his daughter went home for the school year to her mother's house in [place].  Patient reports that things have been "more quiet".  Patient reports no problems with his medications at this time.  Patient denies any suicidal or homicidal ideations.  LCSW used person centered therapy for empowerment.  LCSW use supportive therapy for praise and encouragement.  LCSW psycho analytic therapy for patient to express thoughts, feelings and concerns in session today utilizing free association.  Suicidal/Homicidal: Nowithout intent/plan   Plan: Return again in 3 weeks.  Diagnosis: Paranoid schizophrenia (HCC)  Generalized anxiety disorder  Collaboration of Care: Other None today   Patient/Guardian was advised Release of Information must be obtained prior to any record release in order to collaborate their care with an outside provider. Patient/Guardian was advised if they have not already done so to contact the registration department to sign all necessary forms in order for [place] to release information regarding their care.   Consent: Patient/Guardian gives verbal consent for treatment and assignment of benefits for services provided during this visit. Patient/Guardian expressed understanding and agreed to proceed.   Weber Cooks, LCSW 09/22/2022

## 2022-10-06 ENCOUNTER — Ambulatory Visit (INDEPENDENT_AMBULATORY_CARE_PROVIDER_SITE_OTHER): Payer: Medicaid Other

## 2022-10-06 ENCOUNTER — Encounter (HOSPITAL_COMMUNITY): Payer: Self-pay

## 2022-10-06 VITALS — BP 124/88 | HR 85 | Ht 65.0 in | Wt 155.4 lb

## 2022-10-06 DIAGNOSIS — F411 Generalized anxiety disorder: Secondary | ICD-10-CM

## 2022-10-06 DIAGNOSIS — G4700 Insomnia, unspecified: Secondary | ICD-10-CM

## 2022-10-06 DIAGNOSIS — F200 Paranoid schizophrenia: Secondary | ICD-10-CM

## 2022-10-06 NOTE — Progress Notes (Cosign Needed)
Patient arrived with Mom for injection:  ARIPiprazole ER (ABILIFY MAINTENA) 400 MG    Patient Pleasant as Always . Was given in the RIGHT Deltoid

## 2022-10-19 ENCOUNTER — Encounter (HOSPITAL_COMMUNITY): Payer: Self-pay

## 2022-10-19 ENCOUNTER — Ambulatory Visit (HOSPITAL_COMMUNITY): Payer: Medicaid Other | Admitting: Licensed Clinical Social Worker

## 2022-11-04 ENCOUNTER — Ambulatory Visit (HOSPITAL_COMMUNITY): Payer: Medicaid Other

## 2022-11-08 ENCOUNTER — Other Ambulatory Visit (INDEPENDENT_AMBULATORY_CARE_PROVIDER_SITE_OTHER): Payer: Self-pay | Admitting: Primary Care

## 2022-11-10 ENCOUNTER — Encounter (HOSPITAL_COMMUNITY): Payer: Self-pay

## 2022-11-10 ENCOUNTER — Ambulatory Visit (INDEPENDENT_AMBULATORY_CARE_PROVIDER_SITE_OTHER): Payer: Medicaid Other | Admitting: Family

## 2022-11-10 ENCOUNTER — Ambulatory Visit (INDEPENDENT_AMBULATORY_CARE_PROVIDER_SITE_OTHER): Payer: Medicaid Other

## 2022-11-10 ENCOUNTER — Encounter (HOSPITAL_COMMUNITY): Payer: Self-pay | Admitting: Family

## 2022-11-10 VITALS — BP 110/81 | HR 78 | Resp 16 | Ht 65.0 in | Wt 156.6 lb

## 2022-11-10 DIAGNOSIS — G2401 Drug induced subacute dyskinesia: Secondary | ICD-10-CM

## 2022-11-10 DIAGNOSIS — F333 Major depressive disorder, recurrent, severe with psychotic symptoms: Secondary | ICD-10-CM

## 2022-11-10 DIAGNOSIS — F200 Paranoid schizophrenia: Secondary | ICD-10-CM | POA: Diagnosis not present

## 2022-11-10 NOTE — Progress Notes (Cosign Needed)
Patient arrived with his mother today for his injection of Abilify 400mg . Given in Left Deltoid without issue or complaint. States that he has no side effects and it is working well for him. Pleasant, cooperative, smiling and making small talk. Will return in 28 days.

## 2022-11-10 NOTE — Progress Notes (Signed)
BH MD/PA/NP OP Progress Note  11/10/2022 2:30 PM Isaiah Black  MRN:  [id number]  Chief Complaint: Isaiah Black states" I have no complaints today I started working at the [employer]."  HPI: Isaiah Black is a 47 year old Caucasian male that carries a diagnosis with schizophrenia, generalized anxiety disorder and major depressive disorder.  He presents for long-acting injection.  Patient is accompanied by his mother.  He provided verbal authorization for her to stay during this visit.  Currently he is prescribed aripiprazole 400 mg every 28 days Ingrezza 40 mg, Remeron 7.5 mg  and BuSpar 30 mg BID which she reports taking and tolerating well.  Niko reports overall his mood has improved.  Denied any pressing psychosocial stressors.  States his daughter who is 40 years old recently returned back home [facility]) after a summer visit.  Continues to endorse auditory distractions.  Denied voices are command in nature.  Denied illicit drug use or substance abuse currently.  Chart reviewed TSH 2.16 A1c 5.5 last completed 9 months prior.  Patient will be due for metabolic annual testing - i.e. EKG, CBC,CMP, TSH, prolactin and lipid panel.  Will make medication refills available, noted to have 3 refills availability.   During evaluation Isaiah Black is sitting; he is alert/oriented x 4; calm/cooperative; and mood congruent with affect.  Patient appears a bit occupied with debit card. patient is speaking in a clear tone at moderate volume, and normal pace; with good eye contact. His thought process is coherent and relevant; There is no indication that he is currently responding to internal/external stimuli or experiencing delusional thought content.  Patient denies suicidal/self-harm/homicidal ideation, psychosis, and paranoia.  Patient has remained calm throughout assessment and has answered questions appropriately.    Visit Diagnosis:    ICD-10-CM   1. Severe episode of recurrent major depressive disorder, with  psychotic features (HCC)  F33.3     2. Tardive dyskinesia  G24.01       Past Psychiatric History:   Past Medical History:  Past Medical History:  Diagnosis Date   Anxiety    Asthma    Depression    Disassociation disorder    Paranoid schizophrenia (HCC)    No past surgical history on file.  Family Psychiatric History:   Family History:  Family History  Problem Relation Age of Onset   Colon cancer Mother        mid 40s   Diabetes Mother    Diabetes Maternal Aunt    Diabetes Maternal Uncle    Stomach cancer Neg Hx    Esophageal cancer Neg Hx     Social History:  Social History   Socioeconomic History   Marital status: Single    Spouse name: Not on file   Number of children: 1   Years of education: Not on file   Highest education level: Not on file  Occupational History   Not on file  Tobacco Use   Smoking status: Former   Smokeless tobacco: Never  Vaping Use   Vaping status: Former  Substance and Sexual Activity   Alcohol use: Not Currently    Comment: occ   Drug use: Not Currently   Sexual activity: Not Currently  Other Topics Concern   Not on file  Social History Narrative   Not on file   Social Determinants of Health   Financial Resource Strain: Low Risk  (08/06/2020)   Overall Financial Resource Strain (CARDIA)    Difficulty of Paying Living Expenses: Not hard at all  Food Insecurity: No Food Insecurity (04/14/2021)   Hunger Vital Sign    Worried About Running Out of Food in the Last Year: Never true    Ran Out of Food in the Last Year: Never true  Transportation Needs: No Transportation Needs (04/14/2021)   PRAPARE - Administrator, Civil Service (Medical): No    Lack of Transportation (Non-Medical): No  Physical Activity: Sufficiently Active (09/03/2020)   Exercise Vital Sign    Days of Exercise per Week: 7 days    Minutes of Exercise per Session: 60 min  Recent Concern: Physical Activity - Insufficiently Active (08/06/2020)    Exercise Vital Sign    Days of Exercise per Week: 2 days    Minutes of Exercise per Session: 40 min  Stress: No Stress Concern Present (08/06/2020)   [employer] of Occupational Health - Occupational Stress Questionnaire    Feeling of Stress : Only a little  Social Connections: Moderately Isolated (07/28/2022)   Social Connection and Isolation Panel [NHANES]    Frequency of Communication with Friends and Family: Twice a week    Frequency of Social Gatherings with Friends and Family: Once a week    Attends Religious Services: Never    Database administrator or Organizations: Yes    Attends Engineer, structural: More than 4 times per year    Marital Status: Never married    Allergies:  Allergies  Allergen Reactions   Cogentin [Benztropine]     Metabolic Disorder Labs: Lab Results  Component Value Date   HGBA1C 5.5 01/21/2022   MPG 85.32 03/07/2019   MPG 99.67 07/08/2017   Lab Results  Component Value Date   PROLACTIN 8.6 06/17/2021   PROLACTIN 100.0 (H) 03/07/2019   Lab Results  Component Value Date   CHOL 183 06/17/2021   TRIG 152 (H) 06/17/2021   HDL 49 06/17/2021   CHOLHDL 3.7 06/17/2021   VLDL 10 03/07/2019   LDLCALC 107 (H) 06/17/2021   LDLCALC 80 03/07/2019   Lab Results  Component Value Date   TSH 2.160 01/21/2022   TSH 6.510 (H) 06/17/2021    Therapeutic Level Labs: No results found for: "LITHIUM" No results found for: "VALPROATE" No results found for: "CBMZ"  Current Medications: Current Outpatient Medications  Medication Sig Dispense Refill   ARIPiprazole ER (ABILIFY MAINTENA) 400 MG PRSY prefilled syringe INJECT EVERY 28 TO 30 DAYS 1 each 11   betamethasone valerate ointment (VALISONE) 0.1 % Apply 1 Application topically 2 (two) times daily. 45 g 3   budesonide-formoterol (SYMBICORT) 160-4.5 MCG/ACT inhaler Inhale 2 puffs into the lungs 2 (two) times daily. 3 each 4   busPIRone (BUSPAR) 15 MG tablet Take 2 tablets (30 mg total) by  mouth 2 (two) times daily. 60 tablet 3   hydrOXYzine (ATARAX) 50 MG tablet TAKE 1 TABLET(50 MG) BY MOUTH THREE TIMES DAILY AS NEEDED 90 tablet 3   mirtazapine (REMERON) 7.5 MG tablet TAKE 1 TABLET(7.5 MG) BY MOUTH AT BEDTIME 30 tablet 3   montelukast (SINGULAIR) 10 MG tablet TAKE 1 TABLET (10 MG TOTAL) BY MOUTH AT BEDTIME. 30 tablet 3   valbenazine (INGREZZA) 40 MG capsule Take 1 capsule (40 mg total) by mouth daily. 90 capsule 3   VENTOLIN HFA 108 (90 Base) MCG/ACT inhaler INHALE 2 PUFFS MOUTH EVERY 6 (SIX) HOURS AS NEEDED FOR WHEEZING OR SHORTNESS OF BREATH. 18 g 0   Current Facility-Administered Medications  Medication Dose Route Frequency Provider Last Rate Last Admin  0.9 %  sodium chloride infusion  500 mL Intravenous Once Armbruster, Willaim Rayas, MD       0.9 %  sodium chloride infusion  500 mL Intravenous Once Armbruster, Willaim Rayas, MD       ARIPiprazole ER (ABILIFY MAINTENA) 400 MG prefilled syringe 400 mg  400 mg Intramuscular Q28 days Lenard Lance, FNP   400 mg at 10/06/22 1454     Musculoskeletal: Strength & Muscle Tone: within normal limits Gait & Station: normal Patient leans: N/A  Psychiatric Specialty Exam: Review of Systems  Psychiatric/Behavioral:  Negative for sleep disturbance. The patient is nervous/anxious.   All other systems reviewed and are negative.   There were no vitals taken for this visit.There is no height or weight on file to calculate BMI.  General Appearance: Casual  Eye Contact:  Good  Speech:  Clear and Coherent  Volume:  Normal  Mood:  Anxious and Depressed  Affect:  Congruent  Thought Process:  Coherent  Orientation:  Full (Time, Place, and Person)  Thought Content: Logical   Suicidal Thoughts:  No  Homicidal Thoughts:  No  Memory:  Immediate;   Good Recent;   Good  Judgement:  Good  Insight:  Good  Psychomotor Activity:  Normal  Concentration:  Concentration: Good  Recall:  Good  Fund of Knowledge: Good  Language: Good  Akathisia:  No   Handed:  Right  AIMS (if indicated): done  Assets:  Communication Skills Desire for Improvement Resilience Social Support  ADL's:  Intact  Cognition: WNL  Sleep:  Fair   Screenings: AIMS    Flowsheet Row Office Visit from 03/18/2022 in [facility] Clinical Support from 01/20/2022 in [facility] Office Visit from 11/26/2021 in [facility] Office Visit from 08/06/2021 in [place] Office Visit from 05/12/2021 in [facility]  AIMS Total Score 0 0 0 4 0      AUDIT    Flowsheet Row Ad[facility]Discharged) [facility]  Alcohol Use Disorder Identification Test Final Score (AUDIT) 25 2      CAGE-AID    Flowsheet Row Counselor from 07/03/2019 in [facility]  CAGE-AID Score 0      GAD-7    Flowsheet Row Counselor from 09/22/2022 in [facility] Counselor from 08/25/2022 in [facility] Counselor from 07/28/2022 in [facility] Counselor from 06/30/2022 in [facility] Counselor from 06/01/2022 in [facility]  Total GAD-7 Score 6 10 10 10 9       PHQ2-9    Flowsheet Row Counselor from 09/22/2022 in [facility] Counselor from 08/25/2022 in [facility] Counselor from 07/28/2022 in [facility] Counselor from 06/30/2022 in [facility] Counselor from 06/01/2022 in [facility]  PHQ-2 Total Score 2 1 1 1  0  PHQ-9 Total Score 4 7 6 6 4       Flowsheet Row Counselor from 02/09/2022 in [facility] Office  Visit from 11/26/2021 in [facility] Office Visit from 08/06/2021 in [facility]  C-SSRS RISK CATEGORY No Risk No Risk No Risk        Assessment and Plan: Osten  Drakes present since to long-acting injection clinic for monthly injection with Abilify Miantena 400mg  every 28 days.  Currently prescribed BuSpar 15 mg, hydroxyzine 50 mg, Remeron 7.5 mg and Ingrezza 40 mg daily which she reports taking and tolerating well.  Denying any medication side effects.  Reported involuntary movements have improved since taking medications as indicated.  Patient denied illicit drug use or substance abuse history.  Reports a good appetite.  States he is resting well throughout the night.  Overall mood is improved.   Collaboration of Care: Collaboration of Care: Medication Management AEB refills made available  Patient due for annual metabolic testing to include CBC, CMP,TSH, prolactin lipid and A1c and EKG   Patient/Guardian was advised Release of Information must be obtained prior to any record release in order to collaborate their care with an outside provider. Patient/Guardian was advised if they have not already done so to contact the registration department to sign all necessary forms in order for [place] to release information regarding their care.   Consent: Patient/Guardian gives verbal consent for treatment and assignment of benefits for services provided during this visit. Patient/Guardian expressed understanding and agreed to proceed.    Oneta Rack, NP 11/10/2022, 2:30 PM

## 2022-11-16 ENCOUNTER — Ambulatory Visit (INDEPENDENT_AMBULATORY_CARE_PROVIDER_SITE_OTHER): Payer: Medicaid Other | Admitting: Licensed Clinical Social Worker

## 2022-11-16 DIAGNOSIS — F200 Paranoid schizophrenia: Secondary | ICD-10-CM
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
## 2022-11-19 ENCOUNTER — Ambulatory Visit (INDEPENDENT_AMBULATORY_CARE_PROVIDER_SITE_OTHER): Payer: Self-pay

## 2022-11-19 ENCOUNTER — Telehealth: Payer: Medicaid Other | Admitting: Family Medicine

## 2022-11-19 DIAGNOSIS — U071 COVID-19: Secondary | ICD-10-CM

## 2022-11-19 MED ORDER — NIRMATRELVIR/RITONAVIR (PAXLOVID)TABLET: 3.0000 | ORAL_TABLET | Freq: Two times a day (BID) | ORAL | 0 refills | Status: AC

## 2022-11-19 NOTE — Patient Instructions (Signed)

## 2022-11-19 NOTE — Telephone Encounter (Signed)
Summary: cough body aches/has covid   Pt called in tested positive for covid today, has cough, body aches. He is asking for med ot be sent to [facility] pharmacy. [facility] - [place], [place] - [address] Phone: [phone] Fax: [phone]     Chief Complaint: COVID positive Symptoms: Fatigue, cough, body aches, mild SOB. Frequency: Monday Pertinent Negatives: Patient denies fever Disposition: [] ED /[] Urgent Care (no appt availability in office) / [] Appointment(In office/virtual)/ [x]  [facility]/ [] Home Care/ [] Refused Recommended Disposition /[] [facility]/ []  Follow-up with PCP Additional Notes: Pt. Agrees with [facility], appointment made for pt.  Reason for Disposition  MILD difficulty breathing (e.g., minimal/no SOB at rest, SOB with walking, pulse <100)  Answer Assessment - Initial Assessment Questions 1. COVID-19 DIAGNOSIS: "How do you know that you have COVID?" (e.g., positive lab test or self-test, diagnosed by doctor or NP/PA, symptoms after exposure).     Home test 2. COVID-19 EXPOSURE: "Was there any known exposure to COVID before the symptoms began?" CDC Definition of close contact: within 6 feet (2 meters) for a total of 15 minutes or more over a 24-hour period.      Yes 3. ONSET: "When did the COVID-19 symptoms start?"      Monday 4. WORST SYMPTOM: "What is your worst symptom?" (e.g., cough, fever, shortness of breath, muscle aches)     Mild SOB 5. COUGH: "Do you have a cough?" If Yes, ask: "How bad is the cough?"       Yes 6. FEVER: "Do you have a fever?" If Yes, ask: "What is your temperature, how was it measured, and when did it start?"     No 7. RESPIRATORY STATUS: "Describe your breathing?" (e.g., normal; shortness of breath, wheezing, unable to speak)      Sob 8. BETTER-SAME-WORSE: "Are you getting better, staying the same or getting worse compared to yesterday?"  If getting worse, ask, "In what way?"     Same 9.  OTHER SYMPTOMS: "Do you have any other symptoms?"  (e.g., chills, fatigue, headache, loss of smell or taste, muscle pain, sore throat)     Body aches, loss of taste 10. HIGH RISK DISEASE: "Do you have any chronic medical problems?" (e.g., asthma, heart or lung disease, weak immune system, obesity, etc.)       Yes 11. VACCINE: "Have you had the COVID-19 vaccine?" If Yes, ask: "Which one, how many shots, when did you get it?"       N/a 12. PREGNANCY: "Is there any chance you are pregnant?" "When was your last menstrual period?"       N/a 13. O2 SATURATION MONITOR:  "Do you use an oxygen saturation monitor (pulse oximeter) at home?" If Yes, ask "What is your reading (oxygen level) today?" "What is your usual oxygen saturation reading?" (e.g., 95%)       No  Protocols used: Coronavirus (COVID-19) Diagnosed or Suspected-A-AH

## 2022-11-19 NOTE — Progress Notes (Signed)
Virtual Visit Consent   Isaiah Black, you are scheduled for a virtual visit with a [facility] provider today. Just as with appointments in the office, your consent must be obtained to participate. Your consent will be active for this visit and any virtual visit you may have with one of our providers in the next 365 days. If you have a MyChart account, a copy of this consent can be sent to you electronically.  As this is a virtual visit, video technology does not allow for your provider to perform a traditional examination. This may limit your provider's ability to fully assess your condition. If your provider identifies any concerns that need to be evaluated in person or the need to arrange testing (such as labs, EKG, etc.), we will make arrangements to do so. Although advances in technology are sophisticated, we cannot ensure that it will always work on either your end or our end. If the connection with a video visit is poor, the visit may have to be switched to a telephone visit. With either a video or telephone visit, we are not always able to ensure that we have a secure connection.  By engaging in this virtual visit, you consent to the provision of healthcare and authorize for your insurance to be billed (if applicable) for the services provided during this visit. Depending on your insurance coverage, you may receive a charge related to this service.  I need to obtain your verbal consent now. Are you willing to proceed with your visit today? Isaiah Black has provided verbal consent on 11/19/2022 for a virtual visit (video or telephone). Georgana Curio, FNP  Date: 11/19/2022 6:03 PM  Virtual Visit via Video Note   I, Georgana Curio, connected with  Isaiah Black  ([id number], 46-08-1975) on 11/19/22 at  6:00 PM EDT by a video-enabled telemedicine application and verified that I am speaking with the correct person using two identifiers.  Location: Patient: Virtual Visit Location Patient:  Home Provider: Virtual Visit Location Provider: Home Office   I discussed the limitations of evaluation and management by telemedicine and the availability of in person appointments. The patient expressed understanding and agreed to proceed.    History of Present Illness: Isaiah Black is a 47 y.o. who identifies as a male who was assigned male at birth, and is being seen today for covid positive in home testing, head congestion, body aches, cough, no wheezing, sob or fever. He also has diarrhea. Sx for 2 days. Marland Kitchen  HPI: HPI  Problems:  Patient Active Problem List   Diagnosis Date Noted   Major depressive disorder, recurrent episode, moderate (HCC) 09/03/2020   Tardive dyskinesia 06/25/2020   Paranoid schizophrenia (HCC) 12/20/2019   Severe episode of recurrent major depressive disorder, with psychotic features (HCC) 12/20/2019   Generalized anxiety disorder 12/20/2019   Respiratory arrest (HCC)    Cardiac arrest (HCC) 11/28/2019   Schizoaffective disorder, depressive type (HCC) 03/16/2019   Schizophrenia (HCC) 07/06/2017    Allergies:  Allergies  Allergen Reactions   Cogentin [Benztropine]    Medications:  Current Outpatient Medications:    ARIPiprazole ER (ABILIFY MAINTENA) 400 MG PRSY prefilled syringe, INJECT EVERY 28 TO 30 DAYS, Disp: 1 each, Rfl: 11   betamethasone valerate ointment (VALISONE) 0.1 %, Apply 1 Application topically 2 (two) times daily., Disp: 45 g, Rfl: 3   budesonide-formoterol (SYMBICORT) 160-4.5 MCG/ACT inhaler, Inhale 2 puffs into the lungs 2 (two) times daily., Disp: 3 each, Rfl: 4   busPIRone (BUSPAR) 15 MG  tablet, Take 2 tablets (30 mg total) by mouth 2 (two) times daily., Disp: 60 tablet, Rfl: 3   hydrOXYzine (ATARAX) 50 MG tablet, TAKE 1 TABLET(50 MG) BY MOUTH THREE TIMES DAILY AS NEEDED, Disp: 90 tablet, Rfl: 3   mirtazapine (REMERON) 7.5 MG tablet, TAKE 1 TABLET(7.5 MG) BY MOUTH AT BEDTIME, Disp: 30 tablet, Rfl: 3   montelukast (SINGULAIR) 10 MG  tablet, TAKE 1 TABLET (10 MG TOTAL) BY MOUTH AT BEDTIME., Disp: 30 tablet, Rfl: 3   valbenazine (INGREZZA) 40 MG capsule, Take 1 capsule (40 mg total) by mouth daily., Disp: 90 capsule, Rfl: 3   VENTOLIN HFA 108 (90 Base) MCG/ACT inhaler, INHALE 2 PUFFS MOUTH EVERY 6 (SIX) HOURS AS NEEDED FOR WHEEZING OR SHORTNESS OF BREATH., Disp: 18 g, Rfl: 0  Current Facility-Administered Medications:    0.9 %  sodium chloride infusion, 500 mL, Intravenous, Once, Armbruster, Willaim Rayas, MD   0.9 %  sodium chloride infusion, 500 mL, Intravenous, Once, Armbruster, Willaim Rayas, MD   ARIPiprazole ER (ABILIFY MAINTENA) 400 MG prefilled syringe 400 mg, 400 mg, Intramuscular, Q28 days, Lenard Lance, FNP, 400 mg at 11/10/22 1439  Observations/Objective: Patient is well-developed, well-nourished in no acute distress.  Resting comfortably  at home.  Head is normocephalic, atraumatic.  No labored breathing.  Speech is clear and coherent with logical content.  Patient is alert and oriented at baseline.    Assessment and Plan: There are no diagnoses linked to this encounter. Increase fluids, humidifier at night, tylenol or ibuprofen as directed, UC If sx worsen. Quarantine discussed.   Follow Up Instructions: I discussed the assessment and treatment plan with the patient. The patient was provided an opportunity to ask questions and all were answered. The patient agreed with the plan and demonstrated an understanding of the instructions.  A copy of instructions were sent to the patient via MyChart unless otherwise noted below.     The patient was advised to call back or seek an in-person evaluation if the symptoms worsen or if the condition fails to improve as anticipated.    Georgana Curio, FNP

## 2022-11-22 NOTE — Telephone Encounter (Signed)
FYI

## 2022-12-06 ENCOUNTER — Other Ambulatory Visit (HOSPITAL_COMMUNITY): Payer: Self-pay | Admitting: Psychiatry

## 2022-12-06 ENCOUNTER — Other Ambulatory Visit (INDEPENDENT_AMBULATORY_CARE_PROVIDER_SITE_OTHER): Payer: Self-pay | Admitting: Primary Care

## 2022-12-06 DIAGNOSIS — F200 Paranoid schizophrenia: Secondary | ICD-10-CM

## 2022-12-06 DIAGNOSIS — J4540 Moderate persistent asthma, uncomplicated: Secondary | ICD-10-CM

## 2022-12-06 DIAGNOSIS — Z760 Encounter for issue of repeat prescription: Secondary | ICD-10-CM

## 2022-12-08 ENCOUNTER — Other Ambulatory Visit: Payer: Self-pay

## 2022-12-08 ENCOUNTER — Ambulatory Visit (INDEPENDENT_AMBULATORY_CARE_PROVIDER_SITE_OTHER): Payer: Medicaid Other

## 2022-12-08 VITALS — BP 112/87 | HR 97 | Ht 65.0 in | Wt 154.0 lb

## 2022-12-08 DIAGNOSIS — F200 Paranoid schizophrenia: Secondary | ICD-10-CM

## 2022-12-08 NOTE — Progress Notes (Cosign Needed)
Patient presents today for his due injection of Abilify Maintena 400 mg. Patient was pleasant and cooperative upon approach. Patient said that he is enjoying the cooler weather and looking forward to the holidays. Patient denies SI/HI or AVH. Injection was prepared as ordered and administered in patients right deltoid. Patient tolerated well and without complaint. Patient will return in 28 days for his next due injection.    NDC: 30865-784-69 GEX:[id number] EXP: NOV 2026

## 2022-12-22 ENCOUNTER — Ambulatory Visit (HOSPITAL_COMMUNITY): Payer: Medicaid Other | Admitting: Licensed Clinical Social Worker

## 2022-12-22 DIAGNOSIS — F411 Generalized anxiety disorder: Secondary | ICD-10-CM | POA: Diagnosis not present

## 2022-12-22 DIAGNOSIS — F200 Paranoid schizophrenia: Secondary | ICD-10-CM | POA: Diagnosis not present

## 2022-12-22 NOTE — Progress Notes (Addendum)
THERAPIST PROGRESS NOTE  Session Time: 30   Participation Level: Active  Behavioral Response: CasualAlertEuthymic  Type of Therapy: Individual Therapy  Treatment Goals addressed:  Active     Anxiety Disorder CCP Problem  1 GAD      walk 3 x weekly  (Completed/Met)     Start:  03/24/21    Expected End:  04/01/22    Resolved:  01/12/22      Find independent housing  (Not Applicable)     Start:  03/24/21    Expected End:  10/01/22    Resolved:  07/28/22      Decrease nightmare to 2/7 days weekly  (Completed/Met)     Start:  03/24/21    Expected End:  04/01/22    Resolved:  03/31/22    Goal Note     Pt reports no nightmares that he can rememeber         LTG: Patient will score less than 5 on the Generalized Anxiety Disorder 7 Scale (GAD-7) (Not Progressing)     Start:  03/24/21    Expected End:  06/03/23         STG: Patient will complete at least 80% of assigned homework (Completed/Met)     Start:  03/24/21    Expected End:  10/01/22    Resolved:  07/28/22      STG: Patient will practice problem solving skills 3 times per week for the next 4 weeks (Progressing)     Start:  03/24/21    Expected End:  06/03/23         STG: Report a decrease in anxiety symptoms as evidenced by an overall reduction in anxiety score by a minimum of 25% on the Generalized Anxiety Disorder Scale (Completed/Met)     Start:  03/24/21    Expected End:  04/01/22    Resolved:  12/22/21      Discuss risks and benefits of medication treatment options for this problem and prescribe as indicated (Completed)     Start:  03/24/21    End:  06/02/21      Encourage patient to take psychotropic medication as prescribed (Completed)     Start:  03/24/21    End:  06/02/21      Review results of GAD-7 with the patient to track progress (Completed)     Start:  03/24/21    End:  06/02/21      Work with patient to track symptoms, triggers and/or skill use through a mood chart, diary card,  or journal (Completed)     Start:  03/24/21    End:  06/02/21      Perform psychoeducation regarding anxiety disorders (Completed)     Start:  03/24/21    End:  06/02/21      Provide patient with educational information and reading material on anxiety, its causes, and symptoms (Completed)     Start:  03/24/21    End:  06/24/21      Work with patient to identify the major components of a recent episode of anxiety: physical symptoms, major thoughts and images, and major behaviors they experienced (Completed)     Start:  03/24/21    End:  06/02/21      Work with patient to identify 3 personal goals for managing their anxiety to work on during current treatment (Completed)     Start:  03/24/21    End:  06/02/21         ProgressTowards Goals: Progressing  Interventions: Supportive  Summary: Carwin Masri is a 47 y.o. male who presents with euthymic mood\affect.  Patient was pleasant, cooperative, maintained good eye contact.  He engaged well in therapy session was dressed casually.  Patient reports primary stressors as family conflict.  Patient reports that he has not been talking to his father for multiple years.  Lessie states that this is because his father always dresses girlfriend's over his children.  Patient reports that his father is severely sick and probably towards the end of his life.  He reports that he is currently in a skilled nursing facility and is suffering from fluid on his lungs.  Patient even reports that they are concerned about cancer diagnosis but are unable to do a biopsy because of the severe amount of fluid against his lungs.  Patient reports worry and sadness due to his father being sick but also understands that he has had an inconsistent relationship with his father for over 20 years.  Suicidal/Homicidal: Nowithout intent/plan  Therapist Response:   Intervention/plan: LCSW psycho analytic therapy for patient to express thoughts, feelings and emotions in  session and nonjudgmental environment.  LCSW supportive therapy for praise and encouragement.  LCSW administered a GAD-7.  LCSW administered PHQ-9.  LCSW notes an increase in GAD-7 score.  Patient attributes this to tension and worry about his father.    Plan: Return again in 8 weeks.  Diagnosis: No diagnosis found.  Collaboration of Care: Other None today   Patient/Guardian was advised Release of Information must be obtained prior to any record release in order to collaborate their care with an outside provider. Patient/Guardian was advised if they have not already done so to contact the registration department to sign all necessary forms in order for [place] to release information regarding their care.   Consent: Patient/Guardian gives verbal consent for treatment and assignment of benefits for services provided during this visit. Patient/Guardian expressed understanding and agreed to proceed.   Weber Cooks, LCSW 12/22/2022

## 2023-01-06 ENCOUNTER — Ambulatory Visit (HOSPITAL_COMMUNITY): Payer: Medicaid Other

## 2023-01-06 ENCOUNTER — Encounter (HOSPITAL_COMMUNITY): Payer: Self-pay

## 2023-01-11 ENCOUNTER — Ambulatory Visit (INDEPENDENT_AMBULATORY_CARE_PROVIDER_SITE_OTHER): Payer: Medicaid Other

## 2023-01-11 ENCOUNTER — Encounter (HOSPITAL_COMMUNITY): Payer: Self-pay

## 2023-01-11 VITALS — BP 115/81 | HR 85 | Temp 98.2°F | Ht 65.0 in | Wt 159.0 lb

## 2023-01-11 DIAGNOSIS — F200 Paranoid schizophrenia: Secondary | ICD-10-CM | POA: Diagnosis not present

## 2023-01-11 NOTE — Progress Notes (Addendum)
Patient in today for due Abilify Maintenna 400 mg IM injection every 28 -30 days and presented with appropriate affect, level and pleasant mood and denied any suicidal or homicidal ideations, no plan, intent, or means to want to harm self or others at this time.  Patient reported he still has + auditory hallucinations at times as they are "in the background" but denied an visual hallucinations.  Patient and his Mother reported no issues at this time and no problems with current medication regimen.  Patient's Abilify Maintena 400 mg IM q28-30 day injection prepared as ordered and given to patient in Left Deltoid area.  Patient tolerated due injection without any complaint of pain or discomfort and agreed to return in 28 days for next injection.  Patient or his mother to call if any issues prior to next appointment.

## 2023-01-31 NOTE — Telephone Encounter (Signed)
Request sent 

## 2023-02-08 ENCOUNTER — Ambulatory Visit (INDEPENDENT_AMBULATORY_CARE_PROVIDER_SITE_OTHER): Payer: Medicaid Other | Admitting: Licensed Clinical Social Worker

## 2023-02-08 ENCOUNTER — Ambulatory Visit (INDEPENDENT_AMBULATORY_CARE_PROVIDER_SITE_OTHER): Payer: Medicaid Other | Admitting: *Deleted

## 2023-02-08 ENCOUNTER — Encounter (HOSPITAL_COMMUNITY): Payer: Self-pay

## 2023-02-08 VITALS — BP 125/81 | HR 98 | Resp 16 | Ht 65.0 in | Wt 160.2 lb

## 2023-02-08 DIAGNOSIS — F200 Paranoid schizophrenia: Secondary | ICD-10-CM | POA: Diagnosis not present

## 2023-02-08 NOTE — Progress Notes (Signed)
 THERAPIST PROGRESS NOTE  Session Time: 20   Participation Level: Active  Behavioral Response: CasualAlertAnxious and Depressed  Type of Therapy: Individual Therapy  Treatment Goals addressed:  Active     Anxiety Disorder CCP Problem  1 GAD      walk 3 x weekly  (Completed/Met)     Start:  03/24/21    Expected End:  04/01/22    Resolved:  01/12/22      Find independent housing  (Not Applicable)     Start:  03/24/21    Expected End:  10/01/22    Resolved:  07/28/22      Decrease nightmare to 2/7 days weekly  (Completed/Met)     Start:  03/24/21    Expected End:  04/01/22    Resolved:  03/31/22    Goal Note     Pt reports no nightmares that he can rememeber         LTG: Patient will score less than 5 on the Generalized Anxiety Disorder 7 Scale (GAD-7) (Not Progressing)     Start:  03/24/21    Expected End:  06/03/23         STG: Patient will complete at least 80% of assigned homework (Completed/Met)     Start:  03/24/21    Expected End:  10/01/22    Resolved:  07/28/22      STG: Patient will practice problem solving skills 3 times per week for the next 4 weeks (Progressing)     Start:  03/24/21    Expected End:  06/03/23         STG: Report a decrease in anxiety symptoms as evidenced by an overall reduction in anxiety score by a minimum of 25% on the Generalized Anxiety Disorder Scale (Completed/Met)     Start:  03/24/21    Expected End:  04/01/22    Resolved:  12/22/21      Discuss risks and benefits of medication treatment options for this problem and prescribe as indicated (Completed)     Start:  03/24/21    End:  06/02/21      Encourage patient to take psychotropic medication as prescribed (Completed)     Start:  03/24/21    End:  06/02/21      Review results of GAD-7 with the patient to track progress (Completed)     Start:  03/24/21    End:  06/02/21      Work with patient to track symptoms, triggers and/or skill use through a mood chart,  diary card, or journal (Completed)     Start:  03/24/21    End:  06/02/21      Perform psychoeducation regarding anxiety disorders (Completed)     Start:  03/24/21    End:  06/02/21      Provide patient with educational information and reading material on anxiety, its causes, and symptoms (Completed)     Start:  03/24/21    End:  06/24/21      Work with patient to identify the major components of a recent episode of anxiety: physical symptoms, major thoughts and images, and major behaviors they experienced (Completed)     Start:  03/24/21    End:  06/02/21      Work with patient to identify 3 personal goals for managing their anxiety to work on during current treatment (Completed)     Start:  03/24/21    End:  06/02/21  02/08/2023    3:16 PM 12/22/2022    8:18 AM 11/16/2022    8:18 AM 09/22/2022   10:13 AM  GAD 7 : Generalized Anxiety Score  Nervous, Anxious, on Edge 1 1 1 1   Control/stop worrying 1 2 1 1   Worry too much - different things 1 1 1 1   Trouble relaxing 0 1 0 1  Restless 0 1 1 1   Easily annoyed or irritable 1 2 1  0  Afraid - awful might happen 1 1 1 1   Total GAD 7 Score 5 9 6 6   Anxiety Difficulty Somewhat difficult Somewhat difficult Somewhat difficult Somewhat difficult     ProgressTowards Goals: Progressing  Interventions: CBT, Motivational Interviewing, and Supportive  Summary: Quintell Bonnin is a 48 y.o. male who presents with flat and depressed mood\affect.  Patient was pleasant, cooperative, maintained good eye contact.  He engaged well in therapy session was dressed casually.  Osiel was alert and oriented x 5.   Patient reports primary stressors as grief and loss.  Patient reports that the loss of his father November 22.  Patient reports that he had a limited relationship with his father as he had a lot of resentment towards him.  Burlon states that his father was told was his girlfriend's over him and his sister were sided with other  people or his family.  Amery reports some sadness, anger, and irritability as symptoms for his grief and loss.  Marris reports some financial problems as in order to get his father down here he will have to cremate him and pay at the funeral home a service fee of [phone].  Patient reports that they have $100 of the 750 currently.  LCSW and patient spoke about ways to raise the money such as fun me, selling things at the flea market, and utilizing a tax return.   Suicidal/Homicidal: Nowithout intent/plan  Therapist Response:    Interventions/Plan: LCSW used supportive therapy for praise and encouragement.  LCSW educated patient on the stages of grief and loss.  LCSW used psycho analytic therapy for patient to express thoughts, feelings and concerns and nonjudgmental environment.  LCSW educated patient on a goodbye letter where he can write a letter to his dad stating his thoughts and feelings towards him as he was not able to see him for the past 9 years prior to his death.  LCSW administered a GAD-7.  Plan: Return again in 3 weeks.  Diagnosis: Paranoid schizophrenia (HCC)  Collaboration of Care: Other None today   Patient/Guardian was advised Release of Information must be obtained prior to any record release in order to collaborate their care with an outside provider. Patient/Guardian was advised if they have not already done so to contact the registration department to sign all necessary forms in order for us  to release information regarding their care.   Consent: Patient/Guardian gives verbal consent for treatment and assignment of benefits for services provided during this visit. Patient/Guardian expressed understanding and agreed to proceed.   Juliene GORMAN Patee, LCSW 02/08/2023

## 2023-02-08 NOTE — Progress Notes (Cosign Needed)
 Patient arrived with his injection of Abilify  Maintena 400mg  from his pharmacy. States he had a good holiday and showed this clinical research associate one of his gifts (a heated coat.) Denies side effects from medication. No SI/HI or AV hallucinations. Pleasant, cooperative. No issues or complaints. Injection given in Right Deltoid without issues. Will return in 28 days.

## 2023-03-08 ENCOUNTER — Ambulatory Visit (INDEPENDENT_AMBULATORY_CARE_PROVIDER_SITE_OTHER): Payer: Medicaid Other

## 2023-03-08 ENCOUNTER — Encounter (HOSPITAL_COMMUNITY): Payer: Self-pay

## 2023-03-08 ENCOUNTER — Other Ambulatory Visit: Payer: Self-pay

## 2023-03-08 ENCOUNTER — Ambulatory Visit (INDEPENDENT_AMBULATORY_CARE_PROVIDER_SITE_OTHER): Payer: Medicaid Other | Admitting: Psychiatry

## 2023-03-08 VITALS — BP 119/89 | HR 90 | Ht 66.0 in | Wt 163.2 lb

## 2023-03-08 DIAGNOSIS — G2401 Drug induced subacute dyskinesia: Secondary | ICD-10-CM

## 2023-03-08 DIAGNOSIS — F32A Depression, unspecified: Secondary | ICD-10-CM

## 2023-03-08 DIAGNOSIS — F411 Generalized anxiety disorder: Secondary | ICD-10-CM | POA: Diagnosis not present

## 2023-03-08 DIAGNOSIS — F200 Paranoid schizophrenia: Secondary | ICD-10-CM

## 2023-03-08 MED ORDER — MIRTAZAPINE 7.5 MG PO TABS: ORAL_TABLET | ORAL | 3 refills | Status: DC

## 2023-03-08 MED ORDER — VALBENAZINE TOSYLATE 40 MG PO CAPS: 40.0000 mg | ORAL_CAPSULE | Freq: Every day | ORAL | Status: DC

## 2023-03-08 MED ORDER — ABILIFY MAINTENA 400 MG IM PRSY: PREFILLED_SYRINGE | INTRAMUSCULAR | 11 refills | Status: DC

## 2023-03-08 MED ORDER — BUSPIRONE HCL 15 MG PO TABS: 30.0000 mg | ORAL_TABLET | Freq: Two times a day (BID) | ORAL | 3 refills | Status: DC

## 2023-03-08 MED ORDER — HYDROXYZINE HCL 50 MG PO TABS: ORAL_TABLET | ORAL | 3 refills | Status: DC

## 2023-03-08 NOTE — Progress Notes (Cosign Needed Addendum)
 Pt presents today for injection of Abilify  Maintenna in left deltoid, with no compliant's. Patient states there is no problems with injection today. Patient denies any AVH, SI, and HI. 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

## 2023-03-08 NOTE — Progress Notes (Signed)
 BH MD/PA/NP OP Progress Note  03/08/2023 9:42 AM Json Koelzer  MRN:  [id number]  Chief Complaint: I am doing okay  HPI: 28 old male seen today for follow-up psychiatric evaluation.  He has a psychiatric history of paranoid schizophrenia, anxiety, and disassociation disorder.  He is currently being managed on Buspar  30 mg twice daily, Mirtazapine  7.5 mg nightly, hydroxyzine  50 mg three times daily as needed, Ingrezza  40 mg daily, and Abilify  400 mg monthly injection. Patient reported that his medications are effective in managing his psychiatric condition.   Today he is well groomed, pleasant, cooperative, engaged in conversation, maintained eye contact.  He informed provider that he has been doing okay.  He notes that he has been staying busy working at the [employer].  Patient notes that he has been trying to make more money to pay utility bills.  He also notes that he is trying to make money so that his daughter [name] came visit for the summer.  Since his last visit he reports that his anxiety and depression continues to be well-managed.  Provider conducted a GAD-7, patient scored a 14.   Provider also conducted PHQ-9 and patient scored a 10.  He endorses adequate sleep and appetite.  Today he denies SI/HI.  Patient continues to have auditory and visual hallucinations.  He notes that he continues to hear chattering and occasionally sees shadows.  Patient reports that he is able to cope with this.  Today provider conducted an aims assessment and patient scored a 0.  No medication changes made today.  Patient agreeable to continue medications as prescribed.  No other concerns at this time. Visit Diagnosis:    ICD-10-CM   1. Paranoid schizophrenia (HCC)  F20.0 ARIPiprazole  ER (ABILIFY  MAINTENA) 400 MG PRSY prefilled syringe    2. Generalized anxiety disorder  F41.1 busPIRone  (BUSPAR ) 15 MG tablet    hydrOXYzine  (ATARAX ) 50 MG tablet    mirtazapine  (REMERON ) 7.5 MG tablet    3. Mild depression   F32.A busPIRone  (BUSPAR ) 15 MG tablet    mirtazapine  (REMERON ) 7.5 MG tablet    4. Tardive dyskinesia  G24.01 valbenazine  (INGREZZA ) 40 MG capsule        Past Psychiatric History: paranoid schizophrenia, anxiety, and disassociation disorder.   Past Medical History:  Past Medical History:  Diagnosis Date   Anxiety    Asthma    Depression    Disassociation disorder    Paranoid schizophrenia (HCC)    No past surgical history on file.  Family Psychiatric History: Paternal uncle schizophrenia, Father Bipolar 1, Maternal aunt Bipolar 1, Niece depression  Family History:  Family History  Problem Relation Age of Onset   Colon cancer Mother        mid 26s   Diabetes Mother    Diabetes Maternal Aunt    Diabetes Maternal Uncle    Stomach cancer Neg Hx    Esophageal cancer Neg Hx     Social History:  Social History   Socioeconomic History   Marital status: Single    Spouse name: Not on file   Number of children: 1   Years of education: Not on file   Highest education level: Not on file  Occupational History   Not on file  Tobacco Use   Smoking status: Former   Smokeless tobacco: Never  Vaping Use   Vaping status: Former  Substance and Sexual Activity   Alcohol use: Not Currently    Comment: occ   Drug use: Not Currently  Sexual activity: Not Currently  Other Topics Concern   Not on file  Social History Narrative   Not on file   Social Drivers of Health   Financial Resource Strain: Low Risk  (08/06/2020)   Overall Financial Resource Strain (CARDIA)    Difficulty of Paying Living Expenses: Not hard at all  Food Insecurity: No Food Insecurity (04/14/2021)   Hunger Vital Sign    Worried About Running Out of Food in the Last Year: Never true    Ran Out of Food in the Last Year: Never true  Transportation Needs: No Transportation Needs (04/14/2021)   PRAPARE - Administrator, Civil Service (Medical): No    Lack of Transportation (Non-Medical): No   Physical Activity: Sufficiently Active (09/03/2020)   Exercise Vital Sign    Days of Exercise per Week: 7 days    Minutes of Exercise per Session: 60 min  Recent Concern: Physical Activity - Insufficiently Active (08/06/2020)   Exercise Vital Sign    Days of Exercise per Week: 2 days    Minutes of Exercise per Session: 40 min  Stress: No Stress Concern Present (08/06/2020)   [employer] of Occupational Health - Occupational Stress Questionnaire    Feeling of Stress : Only a little  Social Connections: Moderately Isolated (07/28/2022)   Social Connection and Isolation Panel [NHANES]    Frequency of Communication with Friends and Family: Twice a week    Frequency of Social Gatherings with Friends and Family: Once a week    Attends Religious Services: Never    Database Administrator or Organizations: Yes    Attends Engineer, Structural: More than 4 times per year    Marital Status: Never married    Allergies:  Allergies  Allergen Reactions   Cogentin  [Benztropine ]     Metabolic Disorder Labs: Lab Results  Component Value Date   HGBA1C 5.5 01/21/2022   MPG 85.32 03/07/2019   MPG 99.67 07/08/2017   Lab Results  Component Value Date   PROLACTIN 8.6 06/17/2021   PROLACTIN 100.0 (H) 03/07/2019   Lab Results  Component Value Date   CHOL 183 06/17/2021   TRIG 152 (H) 06/17/2021   HDL 49 06/17/2021   CHOLHDL 3.7 06/17/2021   VLDL 10 03/07/2019   LDLCALC 107 (H) 06/17/2021   LDLCALC 80 03/07/2019   Lab Results  Component Value Date   TSH 2.160 01/21/2022   TSH 6.510 (H) 06/17/2021    Therapeutic Level Labs: No results found for: LITHIUM No results found for: VALPROATE No results found for: CBMZ  Current Medications: Current Outpatient Medications  Medication Sig Dispense Refill   ARIPiprazole  ER (ABILIFY  MAINTENA) 400 MG PRSY prefilled syringe INJECT EVERY 28 TO 30 DAYS 1 each 11   ARIPiprazole  ER (ABILIFY  MAINTENA) 400 MG SRER injection INJECT  EVERY 28 TO 30 DAYS 1 each 11   betamethasone  valerate ointment (VALISONE ) 0.1 % Apply 1 Application topically 2 (two) times daily. 45 g 3   budesonide -formoterol  (SYMBICORT ) 160-4.5 MCG/ACT inhaler Inhale 2 puffs into the lungs 2 (two) times daily. 3 each 4   busPIRone  (BUSPAR ) 15 MG tablet Take 2 tablets (30 mg total) by mouth 2 (two) times daily. 60 tablet 3   hydrOXYzine  (ATARAX ) 50 MG tablet TAKE 1 TABLET(50 MG) BY MOUTH THREE TIMES DAILY AS NEEDED 90 tablet 3   mirtazapine  (REMERON ) 7.5 MG tablet TAKE 1 TABLET(7.5 MG) BY MOUTH AT BEDTIME 30 tablet 3   montelukast  (SINGULAIR ) 10 MG tablet  TAKE 1 TABLET (10 MG TOTAL) BY MOUTH AT BEDTIME. 30 tablet 3   valbenazine  (INGREZZA ) 40 MG capsule Take 1 capsule (40 mg total) by mouth daily.     VENTOLIN  HFA 108 (90 Base) MCG/ACT inhaler INHALE 2 PUFFS MOUTH EVERY 6 (SIX) HOURS AS NEEDED FOR WHEEZING OR SHORTNESS OF BREATH. 18 g 0   Current Facility-Administered Medications  Medication Dose Route Frequency Provider Last Rate Last Admin   0.9 %  sodium chloride  infusion  500 mL Intravenous Once Armbruster, Elspeth SQUIBB, MD       0.9 %  sodium chloride  infusion  500 mL Intravenous Once Armbruster, Elspeth SQUIBB, MD       ARIPiprazole  ER (ABILIFY  MAINTENA) 400 MG prefilled syringe 400 mg  400 mg Intramuscular Q28 days Dasie Ellouise CROME, FNP   400 mg at 03/08/23 9148     Musculoskeletal: Strength & Muscle Tone: within normal limits Gait & Station: normal Patient leans: N/A  Psychiatric Specialty Exam: Review of Systems  There were no vitals taken for this visit.There is no height or weight on file to calculate BMI.  General Appearance: Well Groomed  Eye Contact:  Good  Speech:  Clear and Coherent and Normal Rate  Volume:  Normal  Mood:  Euthymic  Affect:  Appropriate and Congruent  Thought Process:  Coherent, Goal Directed, and Linear  Orientation:  Full (Time, Place, and Person)  Thought Content: Logical and Hallucinations: Auditory   Suicidal Thoughts:   No  Homicidal Thoughts:  No  Memory:  Immediate;   Good Recent;   Good Remote;   Good  Judgement:  Good  Insight:  Good  Psychomotor Activity:  Normal  Concentration:  Concentration: Good and Attention Span: Good  Recall:  Good  Fund of Knowledge: Good  Language: Good  Akathisia:  No  Handed:  Right  AIMS (if indicated): done, 0  Assets:  Communication Skills Desire for Improvement Financial Resources/Insurance Housing Leisure Time Physical Health Social Support  ADL's:  Intact  Cognition: WNL  Sleep:  Good   Screenings: AIMS    Flowsheet Row Office Visit from 03/08/2023 in [facility] Office Visit from 03/18/2022 in [facility] Clinical Support from 01/20/2022 in [facility] Office Visit from 11/26/2021 in [facility] Office Visit from 08/06/2021 in [facility]  AIMS Total Score 0 0 0 0 4      AUDIT    Flowsheet Row Admission (Discharged) [facility]06[facility] ([facility]  Alcohol Use Disorder Identification Test Final Score (AUDIT) 25 2      CAGE-AID    Flowsheet Row Counselor from 07/03/2019 in [facility]  CAGE-AID Score 0      GAD-7    Flowsheet Row Office Visit from 03/08/2023 in [facility] Counselor from 02/08/2023 in [facility] Counselor from 12/22/2022 in [facility] Counselor from 11/16/2022 in [facility] Counselor from 09/22/2022 in [facility]  Total GAD-7 Score 14 5 9 6 6       PHQ2-9    Flowsheet Row Office Visit from 03/08/2023 in [facility] Counselor from 12/22/2022 in [facility]  Counselor from 11/16/2022 in [facility] Counselor from 09/22/2022 in [facility] Counselor  from 08/25/2022 in [facility]  PHQ-2 Total Score 2 1 0 2 1  PHQ-9 Total Score 10 4 4 4 7       Flowsheet Row Office Visit from 03/08/2023 in [facility] Counselor from 12/22/2022 in [facility] Counselor from 02/09/2022 in [facility]  C-SSRS RISK CATEGORY No Risk No Risk No Risk        Assessment and Plan: Patient endorses auditory hallucination and hallucinations but notes that he is able to cope with it.  No medication changes made today.  Patient agreeable to continue medication as prescribed.   1. Paranoid schizophrenia (HCC)  Continue- ARIPiprazole  ER (ABILIFY  MAINTENA) 400 MG PRSY prefilled syringe; INJECT EVERY 28 TO 30 DAYS  Dispense: 1 each; Refill: 11  2. Generalized anxiety disorder  Continue- busPIRone  (BUSPAR ) 15 MG tablet; Take 2 tablets (30 mg total) by mouth 2 (two) times daily.  Dispense: 60 tablet; Refill: 3 Continue- hydrOXYzine  (ATARAX ) 50 MG tablet; TAKE 1 TABLET(50 MG) BY MOUTH THREE TIMES DAILY AS NEEDED  Dispense: 90 tablet; Refill: 3 Continue- mirtazapine  (REMERON ) 7.5 MG tablet; TAKE 1 TABLET(7.5 MG) BY MOUTH AT BEDTIME  Dispense: 30 tablet; Refill: 3  3. Mild depression  Continue- busPIRone  (BUSPAR ) 15 MG tablet; Take 2 tablets (30 mg total) by mouth 2 (two) times daily.  Dispense: 60 tablet; Refill: 3 Continue- mirtazapine  (REMERON ) 7.5 MG tablet; TAKE 1 TABLET(7.5 MG) BY MOUTH AT BEDTIME  Dispense: 30 tablet; Refill: 3  4. Tardive dyskinesia  Continue- valbenazine  (INGREZZA ) 40 MG capsule; Take 1 capsule (40 mg total) by mouth daily.  Dispense: 90 capsule; Refill: 3    Follow-up in 3 months Zane FORBES Bach, NP 03/08/2023, 9:42 AM

## 2023-03-16 ENCOUNTER — Ambulatory Visit (HOSPITAL_COMMUNITY): Payer: Medicaid Other | Admitting: Licensed Clinical Social Worker

## 2023-03-16 DIAGNOSIS — F251 Schizoaffective disorder, depressive type: Secondary | ICD-10-CM | POA: Diagnosis not present

## 2023-03-16 NOTE — Progress Notes (Signed)
THERAPIST PROGRESS NOTE  Virtual Visit via Video Note  I connected with Isaiah Black on 03/16/23 at  9:00 AM EST by a video enabled telemedicine application and verified that I am speaking with the correct person using two identifiers.  Location: Patient: [facility]  Provider: Providers Home    I discussed the limitations of evaluation and management by telemedicine and the availability of in person appointments. The patient expressed understanding and agreed to proceed.     I discussed the assessment and treatment plan with the patient. The patient was provided an opportunity to ask questions and all were answered. The patient agreed with the plan and demonstrated an understanding of the instructions.   The patient was advised to call back or seek an in-person evaluation if the symptoms worsen or if the condition fails to improve as anticipated.  I provided 30  minutes of non-face-to-face time during this encounter.   Weber Cooks, LCSW  Participation Level: Active  Behavioral Response: CasualAlertAnxious and Depressed  Type of Therapy: Individual Therapy  Treatment Goals addressed:  Active     Anxiety Disorder CCP Problem  1 GAD      walk 3 x weekly  (Completed/Met)     Start:  03/24/21    Expected End:  04/01/22    Resolved:  01/12/22      Find independent housing  (Not Applicable)     Start:  03/24/21    Expected End:  10/01/22    Resolved:  07/28/22      Decrease nightmare to 2/7 days weekly  (Completed/Met)     Start:  03/24/21    Expected End:  04/01/22    Resolved:  03/31/22    Goal Note     Pt reports no nightmares that he can rememeber         LTG: Patient will score less than 5 on the Generalized Anxiety Disorder 7 Scale (GAD-7) (Completed/Met)     Start:  03/24/21    Expected End:  06/03/23    Resolved:  03/16/23      STG: Patient will complete at least 80% of assigned homework (Completed/Met)     Start:  03/24/21    Expected End:   10/01/22    Resolved:  07/28/22      STG: Patient will practice problem solving skills 3 times per week for the next 4 weeks (Progressing)     Start:  03/24/21    Expected End:  06/03/23         STG: Report a decrease in anxiety symptoms as evidenced by an overall reduction in anxiety score by a minimum of 25% on the Generalized Anxiety Disorder Scale (Completed/Met)     Start:  03/24/21    Expected End:  04/01/22    Resolved:  12/22/21      Discuss risks and benefits of medication treatment options for this problem and prescribe as indicated (Completed)     Start:  03/24/21    End:  06/02/21      Encourage patient to take psychotropic medication as prescribed (Completed)     Start:  03/24/21    End:  06/02/21      Review results of GAD-7 with the patient to track progress (Completed)     Start:  03/24/21    End:  06/02/21      Work with patient to track symptoms, triggers and/or skill use through a mood chart, diary card, or journal (Completed)  Start:  03/24/21    End:  06/02/21      Perform psychoeducation regarding anxiety disorders (Completed)     Start:  03/24/21    End:  06/02/21      Provide patient with educational information and reading material on anxiety, its causes, and symptoms (Completed)     Start:  03/24/21    End:  06/24/21      Work with patient to identify the major components of a recent episode of anxiety: physical symptoms, major thoughts and images, and major behaviors they experienced (Completed)     Start:  03/24/21    End:  06/02/21      Work with patient to identify 3 personal goals for managing their anxiety to work on during current treatment (Completed)     Start:  03/24/21    End:  06/02/21           03/16/2023    9:07 AM 03/08/2023    9:39 AM 02/08/2023    3:16 PM 12/22/2022    8:18 AM  GAD 7 : Generalized Anxiety Score  Nervous, Anxious, on Edge 1 3 1 1   Control/stop worrying 1 3 1 2   Worry too much - different things 1  3 1 1   Trouble relaxing 1 1 0 1  Restless 0 1 0 1  Easily annoyed or irritable 1 2 1 2   Afraid - awful might happen 1 1 1 1   Total GAD 7 Score 6 14 5 9   Anxiety Difficulty Somewhat difficult Somewhat difficult Somewhat difficult Somewhat difficult     ProgressTowards Goals: Progressing  Interventions: CBT and Motivational Interviewing  Summary: Loc [name] is a 48 y.o. male who presents with depressed and anxious mood\affect.  Patient was pleasant, cooperative, maintained good eye contact.  Engaged well in therapy session was dressed casually.  Patient comes in today with primary stressor as financials.  He reports that he is trying to save up enough money to get his daughter Isaiah Black is here for the summer.  He reports that he will need to save up enough money for a bus ticket for him and his mom to be able to go up there and get her and then return her back prior to school starting.  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  He reports he is also saving some of his Social Security to help with getting his daughter here.  Patient reports an alternative plan would be his sister to buy car and drive down there to get her.  Patient does report some stressors for illness as he has been battling a cough and congestion.  He reports that he is taking rest and has taken a COVID test that was negative.  Suicidal/Homicidal: Nowithout intent/plan  Therapist Response:     Intervention/Plan: LCSW administered a GAD-7.  LCSW administered a PHQ-9.  LCSW reviewed those scores with patient.  LCSW notes a score below a 5 for PHQ-9.  LCSW notes a score of 6 for GAD-7.  LCSW supportive therapy for praise and encouragement.  LCSW utilized psychoanalytic therapy performed for patient to express thoughts, feelings and concerns and nonjudgmental environment.  Plan: Return again in 5 weeks.  Diagnosis: Schizoaffective disorder, depressive type (HCC)  Collaboration of  Care: Other None today   Patient/Guardian was advised Release of Information must be obtained prior to any record release in order to collaborate their care with an outside provider. Patient/Guardian was advised if they  have not already done so to contact the registration department to sign all necessary forms in order for [place] to release information regarding their care.   Consent: Patient/Guardian gives verbal consent for treatment and assignment of benefits for services provided during this visit. Patient/Guardian expressed understanding and agreed to proceed.   Weber Cooks, LCSW 03/16/2023

## 2023-04-05 ENCOUNTER — Ambulatory Visit (INDEPENDENT_AMBULATORY_CARE_PROVIDER_SITE_OTHER): Payer: Medicaid Other

## 2023-04-05 VITALS — BP 120/78 | HR 88 | Ht 65.5 in | Wt 162.0 lb

## 2023-04-05 DIAGNOSIS — F251 Schizoaffective disorder, depressive type: Secondary | ICD-10-CM | POA: Diagnosis not present

## 2023-04-05 NOTE — Progress Notes (Cosign Needed)
 Pt presents today for injection of Abilify Maintenna administered in Right deltoid with  no complaints.    Pt presents today well groomed and very conversational. States that he is doing good over all, pre in regards to mental. Pt denies any AVH, SI, and HI. PT states hew has no concerns to be noted to provider for today.

## 2023-04-27 ENCOUNTER — Ambulatory Visit (HOSPITAL_COMMUNITY): Payer: Medicaid Other | Admitting: Licensed Clinical Social Worker

## 2023-04-27 DIAGNOSIS — F200 Paranoid schizophrenia: Secondary | ICD-10-CM

## 2023-04-27 NOTE — Progress Notes (Signed)
 THERAPIST PROGRESS NOTE  Session Time: 30  Participation Level: Active  Behavioral Response: CasualAlertAnxious and Depressed  Type of Therapy: Individual Therapy  Treatment Goals addressed:  Active     Anxiety Disorder CCP Problem  1 GAD      walk 3 x weekly  (Completed/Met)     Start:  03/24/21    Expected End:  04/01/22    Resolved:  01/12/22      Find independent housing  (Not Applicable)     Start:  03/24/21    Expected End:  10/01/22    Resolved:  07/28/22      Decrease nightmare to 2/7 days weekly  (Completed/Met)     Start:  03/24/21    Expected End:  04/01/22    Resolved:  03/31/22    Goal Note     Pt reports no nightmares that he can rememeber         LTG: Patient will score less than 5 on the Generalized Anxiety Disorder 7 Scale (GAD-7) (Completed/Met)     Start:  03/24/21    Expected End:  06/03/23    Resolved:  03/16/23      STG: Patient will complete at least 80% of assigned homework (Completed/Met)     Start:  03/24/21    Expected End:  10/01/22    Resolved:  07/28/22      STG: Patient will practice problem solving skills 3 times per week for the next 4 weeks (Progressing)     Start:  03/24/21    Expected End:  06/03/23         STG: Report a decrease in anxiety symptoms as evidenced by an overall reduction in anxiety score by a minimum of 25% on the Generalized Anxiety Disorder Scale (Completed/Met)     Start:  03/24/21    Expected End:  04/01/22    Resolved:  12/22/21      Discuss risks and benefits of medication treatment options for this problem and prescribe as indicated (Completed)     Start:  03/24/21    End:  06/02/21      Encourage patient to take psychotropic medication as prescribed (Completed)     Start:  03/24/21    End:  06/02/21      Review results of GAD-7 with the patient to track progress (Completed)     Start:  03/24/21    End:  06/02/21      Work with patient to track symptoms, triggers and/or skill use  through a mood chart, diary card, or journal (Completed)     Start:  03/24/21    End:  06/02/21      Perform psychoeducation regarding anxiety disorders (Completed)     Start:  03/24/21    End:  06/02/21      Provide patient with educational information and reading material on anxiety, its causes, and symptoms (Completed)     Start:  03/24/21    End:  06/24/21      Work with patient to identify the major components of a recent episode of anxiety: physical symptoms, major thoughts and images, and major behaviors they experienced (Completed)     Start:  03/24/21    End:  06/02/21      Work with patient to identify 3 personal goals for managing their anxiety to work on during current treatment (Completed)     Start:  03/24/21    End:  06/02/21  ProgressTowards Goals: Progressing  Interventions: CBT, Motivational Interviewing, and Supportive  Summary: Isaiah Black was alert and oriented x 5.  He was pleasant, cooperative, maintained good eye contact.  He engaged well in therapy session was dressed casually.  Isaiah Black presented with euthymic mood/affect  Patient comes in today stating that everything has been going "well".  He reports he has gotten a new machine for freeze dry  candy from a tractor supply store.  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  This is as evidenced by providing LCSW with his freeze-dried schedules in session today.    Other stressors for patient include having his daughter Isaiah Black and her half-brother coming to town.  He reports that they can sometimes get physical and rambunctious.  Patient is concerned that if he does not set rules and consequences.  LCSW reviewed consequences that could be potentially used for the kids.  Patient came up with pool time taken away, game time taken away, and grounding them during activities that they have plan throughout the week.  LCSW  and patient spoke about stressors for activities of daily living.  Patient reports today that he is only showering 1 time weekly.  He reports "I have to shower today".  Patient reports that he would like to increase his showering to at least 2-3 times weekly.   Suicidal/Homicidal: Nowithout intent/plan  Therapist Response:     Intervention/Plan: LCSW utilize psychoanalytic therapy for patient to express thoughts, feelings and emotions and nonjudgmental environment.  LCSW utilized education on behavioral health modification such as consequences and positive reinforcement.  LCSW utilize open-ended questions, and reflective listening for motivational interviewing.  LCSW spoke with patient about setting a schedule for hygiene such as 7 days a week to take showers.  LCSW encouraged patient to make a chart to help encourage him to shower more often. Plan: Return again in 5 weeks.  Diagnosis: Paranoid schizophrenia (HCC)  Collaboration of Care: Other None today   Patient/Guardian was advised Release of Information must be obtained prior to any record release in order to collaborate their care with an outside provider. Patient/Guardian was advised if they have not already done so to contact the registration department to sign all necessary forms in order for [place] to release information regarding their care.   Consent: Patient/Guardian gives verbal consent for treatment and assignment of benefits for services provided during this visit. Patient/Guardian expressed understanding and agreed to proceed.   Weber Cooks, LCSW 04/27/2023

## 2023-05-03 ENCOUNTER — Ambulatory Visit (INDEPENDENT_AMBULATORY_CARE_PROVIDER_SITE_OTHER)

## 2023-05-03 VITALS — BP 121/86 | HR 90 | Temp 97.9°F | Ht 65.5 in | Wt 162.8 lb

## 2023-05-03 DIAGNOSIS — F200 Paranoid schizophrenia: Secondary | ICD-10-CM

## 2023-05-03 NOTE — Progress Notes (Cosign Needed)
 Pt presents today for injection of Abilify Maintenna administered in left deltoid with  no complaints.     JNL, CMA  Pt presents today well groomed and very conversational. States that he is doing good over all, pre in regards to mental. Pt denies any AVH, SI, and HI. PT states he has no concerns to be noted to provider for today   JNL, CMA

## 2023-05-31 ENCOUNTER — Ambulatory Visit (INDEPENDENT_AMBULATORY_CARE_PROVIDER_SITE_OTHER)

## 2023-05-31 ENCOUNTER — Ambulatory Visit (INDEPENDENT_AMBULATORY_CARE_PROVIDER_SITE_OTHER): Admitting: Psychiatry

## 2023-05-31 ENCOUNTER — Encounter (HOSPITAL_COMMUNITY): Payer: Self-pay

## 2023-05-31 VITALS — BP 123/84 | HR 98 | Temp 98.6°F | Ht 66.0 in | Wt 163.5 lb

## 2023-05-31 DIAGNOSIS — R9431 Abnormal electrocardiogram [ECG] [EKG]: Secondary | ICD-10-CM

## 2023-05-31 DIAGNOSIS — F411 Generalized anxiety disorder: Secondary | ICD-10-CM

## 2023-05-31 DIAGNOSIS — F32A Depression, unspecified: Secondary | ICD-10-CM

## 2023-05-31 DIAGNOSIS — F200 Paranoid schizophrenia: Secondary | ICD-10-CM | POA: Diagnosis not present

## 2023-05-31 MED ORDER — BUSPIRONE HCL 15 MG PO TABS: 30.0000 mg | ORAL_TABLET | Freq: Two times a day (BID) | ORAL | 3 refills | Status: DC

## 2023-05-31 MED ORDER — ABILIFY MAINTENA 400 MG IM PRSY: PREFILLED_SYRINGE | INTRAMUSCULAR | 11 refills | Status: DC

## 2023-05-31 MED ORDER — HYDROXYZINE HCL 50 MG PO TABS: ORAL_TABLET | ORAL | 3 refills | Status: DC

## 2023-05-31 MED ORDER — MIRTAZAPINE 7.5 MG PO TABS: ORAL_TABLET | ORAL | 3 refills | Status: DC

## 2023-05-31 NOTE — Progress Notes (Cosign Needed)
 Pt presents today for injection of Abilify  Maintenna administered in right deltoid with  no complaints.     JNL, CMA   Pt presents today well groomed and very conversational. States that he is doing good over all, pre in regards to mental. Pt denies any AVH, SI, and HI. PT states he has no concerns to be noted to provider for today.     JNL, CMA

## 2023-05-31 NOTE — Progress Notes (Signed)
 BH MD/PA/NP OP Progress Note  05/31/2023 9:04 AM Isaiah Black  MRN:  [id number]  Chief Complaint: "I have not taking Ingrezza "  HPI: 70 old male seen today for follow-up psychiatric evaluation.  He has a psychiatric history of paranoid schizophrenia, anxiety, and disassociation disorder.  He is currently being managed on Buspar  30 mg twice daily, Mirtazapine  7.5 mg nightly, hydroxyzine  50 mg three times daily as needed, Ingrezza  40 mg daily, and Abilify  400 mg monthly injection. Patient reported that his medications are effective in managing his psychiatric condition.   Today he is well groomed, pleasant, cooperative, engaged in conversation, maintained eye contact.  He informed provider that he has not been taking Ingrezza .  He denies current symptoms of TD.  Provider conducted the aims assessment and patient scored a 0.  Patient reports that he has been anticipating the arrival of his daughter [name] for the summer.  He notes that he is a little nervous as well that brother might be visiting as well.  He however reports that he is able to cope with it.  Today provider conducted a GAD-7, patient scored a 14, at his last visit he scored a 14.   Provider also conducted PHQ-9 and patient scored a 9, at his last visit he scored a 10.  He endorses adequate sleep and appetite.  Today he denies SI/HI.  Patient continues to have auditory and visual hallucinations.  He notes that he continues to hear chattering and occasionally sees shadows.  Patient reports that he is able to cope with this.  Patient has no current EKG.  He is on Abilify  which can cause prolonged QT intervals.  Patient referred to cardiology for cardiac workup.  No medication changes made today.  Patient agreeable to continue medications as prescribed.  No other concerns at this time. Visit Diagnosis:    ICD-10-CM   1. Nonspecific abnormal electrocardiogram (ECG) (EKG)  R94.31 Ambulatory referral to Cardiology    2. Paranoid schizophrenia  (HCC)  F20.0 ARIPiprazole  ER (ABILIFY  MAINTENA) 400 MG PRSY prefilled syringe    3. Generalized anxiety disorder  F41.1 hydrOXYzine  (ATARAX ) 50 MG tablet    mirtazapine  (REMERON ) 7.5 MG tablet    busPIRone  (BUSPAR ) 15 MG tablet    4. Mild depression  F32.A mirtazapine  (REMERON ) 7.5 MG tablet    busPIRone  (BUSPAR ) 15 MG tablet        Past Psychiatric History: paranoid schizophrenia, anxiety, and disassociation disorder.   Past Medical History:  Past Medical History:  Diagnosis Date   Anxiety    Asthma    Depression    Disassociation disorder    Paranoid schizophrenia (HCC)    No past surgical history on file.  Family Psychiatric History: Paternal uncle schizophrenia, Father Bipolar 1, Maternal aunt Bipolar 1, Niece depression  Family History:  Family History  Problem Relation Age of Onset   Colon cancer Mother        mid 67s   Diabetes Mother    Diabetes Maternal Aunt    Diabetes Maternal Uncle    Stomach cancer Neg Hx    Esophageal cancer Neg Hx     Social History:  Social History   Socioeconomic History   Marital status: Single    Spouse name: Not on file   Number of children: 1   Years of education: Not on file   Highest education level: Not on file  Occupational History   Not on file  Tobacco Use   Smoking status: Former   Smokeless  tobacco: Never  Vaping Use   Vaping status: Former  Substance and Sexual Activity   Alcohol use: Not Currently    Comment: occ   Drug use: Not Currently   Sexual activity: Not Currently  Other Topics Concern   Not on file  Social History Narrative   Not on file   Social Drivers of Health   Financial Resource Strain: Low Risk  (08/06/2020)   Overall Financial Resource Strain (CARDIA)    Difficulty of Paying Living Expenses: Not hard at all  Food Insecurity: No Food Insecurity (04/14/2021)   Hunger Vital Sign    Worried About Running Out of Food in the Last Year: Never true    Ran Out of Food in the Last Year: Never  true  Transportation Needs: No Transportation Needs (04/14/2021)   PRAPARE - Administrator, Civil Service (Medical): No    Lack of Transportation (Non-Medical): No  Physical Activity: Sufficiently Active (09/03/2020)   Exercise Vital Sign    Days of Exercise per Week: 7 days    Minutes of Exercise per Session: 60 min  Recent Concern: Physical Activity - Insufficiently Active (08/06/2020)   Exercise Vital Sign    Days of Exercise per Week: 2 days    Minutes of Exercise per Session: 40 min  Stress: No Stress Concern Present (08/06/2020)   [employer] of Occupational Health - Occupational Stress Questionnaire    Feeling of Stress : Only a little  Social Connections: Moderately Isolated (07/28/2022)   Social Connection and Isolation Panel [NHANES]    Frequency of Communication with Friends and Family: Twice a week    Frequency of Social Gatherings with Friends and Family: Once a week    Attends Religious Services: Never    Database administrator or Organizations: Yes    Attends Engineer, structural: More than 4 times per year    Marital Status: Never married    Allergies:  Allergies  Allergen Reactions   Cogentin  [Benztropine ]     Metabolic Disorder Labs: Lab Results  Component Value Date   HGBA1C 5.5 01/21/2022   MPG 85.32 03/07/2019   MPG 99.67 07/08/2017   Lab Results  Component Value Date   PROLACTIN 8.6 06/17/2021   PROLACTIN 100.0 (H) 03/07/2019   Lab Results  Component Value Date   CHOL 183 06/17/2021   TRIG 152 (H) 06/17/2021   HDL 49 06/17/2021   CHOLHDL 3.7 06/17/2021   VLDL 10 03/07/2019   LDLCALC 107 (H) 06/17/2021   LDLCALC 80 03/07/2019   Lab Results  Component Value Date   TSH 2.160 01/21/2022   TSH 6.510 (H) 06/17/2021    Therapeutic Level Labs: No results found for: "LITHIUM" No results found for: "VALPROATE" No results found for: "CBMZ"  Current Medications: Current Outpatient Medications  Medication Sig Dispense  Refill   ARIPiprazole  ER (ABILIFY  MAINTENA) 400 MG PRSY prefilled syringe INJECT EVERY 28 TO 30 DAYS 1 each 11   ARIPiprazole  ER (ABILIFY  MAINTENA) 400 MG SRER injection INJECT EVERY 28 TO 30 DAYS 1 each 11   betamethasone  valerate ointment (VALISONE ) 0.1 % Apply 1 Application topically 2 (two) times daily. 45 g 3   budesonide -formoterol  (SYMBICORT ) 160-4.5 MCG/ACT inhaler Inhale 2 puffs into the lungs 2 (two) times daily. 3 each 4   busPIRone  (BUSPAR ) 15 MG tablet Take 2 tablets (30 mg total) by mouth 2 (two) times daily. 60 tablet 3   hydrOXYzine  (ATARAX ) 50 MG tablet TAKE 1 TABLET(50 MG) BY  MOUTH THREE TIMES DAILY AS NEEDED 90 tablet 3   mirtazapine  (REMERON ) 7.5 MG tablet TAKE 1 TABLET(7.5 MG) BY MOUTH AT BEDTIME 30 tablet 3   montelukast  (SINGULAIR ) 10 MG tablet TAKE 1 TABLET (10 MG TOTAL) BY MOUTH AT BEDTIME. 30 tablet 3   VENTOLIN  HFA 108 (90 Base) MCG/ACT inhaler INHALE 2 PUFFS MOUTH EVERY 6 (SIX) HOURS AS NEEDED FOR WHEEZING OR SHORTNESS OF BREATH. 18 g 0   Current Facility-Administered Medications  Medication Dose Route Frequency Provider Last Rate Last Admin   0.9 %  sodium chloride  infusion  500 mL Intravenous Once Armbruster, Lendon Queen, MD       0.9 %  sodium chloride  infusion  500 mL Intravenous Once Armbruster, Lendon Queen, MD       ARIPiprazole  ER (ABILIFY  MAINTENA) 400 MG prefilled syringe 400 mg  400 mg Intramuscular Q28 days Tor Freed, FNP   400 mg at 05/31/23 1610     Musculoskeletal: Strength & Muscle Tone: within normal limits Gait & Station: normal Patient leans: N/A  Psychiatric Specialty Exam: Review of Systems  There were no vitals taken for this visit.There is no height or weight on file to calculate BMI.  General Appearance: Well Groomed  Eye Contact:  Good  Speech:  Clear and Coherent and Normal Rate  Volume:  Normal  Mood:  Euthymic  Affect:  Appropriate and Congruent  Thought Process:  Coherent, Goal Directed, and Linear  Orientation:  Full (Time,  Place, and Person)  Thought Content: Logical and Hallucinations: Auditory   Suicidal Thoughts:  No  Homicidal Thoughts:  No  Memory:  Immediate;   Good Recent;   Good Remote;   Good  Judgement:  Good  Insight:  Good  Psychomotor Activity:  Normal  Concentration:  Concentration: Good and Attention Span: Good  Recall:  Good  Fund of Knowledge: Good  Language: Good  Akathisia:  No  Handed:  Right  AIMS (if indicated): done, 0  Assets:  Communication Skills Desire for Improvement Financial Resources/Insurance Housing Leisure Time Physical Health Social Support  ADL's:  Intact  Cognition: WNL  Sleep:  Good   Screenings: AIMS    Flowsheet Row Office Visit from 05/31/2023 in [facility] Office Visit from 03[facility]08/2023 in [facility] Office Visit from 03/18/2022 in [facility] Clinical Support from 01/20/2022 in [facility] Office Visit from 11/26/2021 in [facility]  AIMS Total Score 0 0 0 0 0      AUDIT    Flowsheet Row Ad[facility] (Discharged) [facility] ([facility]  Alcohol Use Disorder Identification Test Final Score (AUDIT) 25 2      CAGE-AID    Flowsheet Row Counselor from 07/03/2019 in [facility]  CAGE-AID Score 0      GAD-7    Flowsheet Row Office Visit from 05/31/2023 in [facility] Counselor from 03/16/2023 in [facility] Office Visit from 03/08/2023 in [facility] Counselor from 02/08/2023 in [facility] Counselor from 12/22/2022 in [facility]  Total GAD-7 Score 14 6 14 5 9       PHQ2-9    Flowsheet Row Office Visit from 05/31/2023 in [facility] Counselor from 03/16/2023 in [facility] Office Visit  from 03/08/2023 in [facility] Counselor from 12/22/2022 in [facility] Counselor from 11/16/2022 in [facility]  PHQ-2 Total Score 1 0 2 1 0  PHQ-9 Total Score 9 3 10 4 4       Flowsheet Row Office Visit from 05/31/2023 in [facility] Counselor from 04/27/2023 in [facility] Office Visit from 03/08/2023 in [facility]  C-SSRS RISK CATEGORY No Risk No Risk No Risk        Assessment and Plan: Patient endorses auditory hallucination and hallucinations but notes that he is able to cope with it.Patient has no current EKG.  He is on Abilify  which can cause prolonged QT intervals.  Patient referred to cardiology for cardiac workup.  Patient reports he is not taking Ingrezza .  At this time agrees to discontinue no othrt medication changes made today.  Patient agreeable to continue medications as prescribed.  No other concerns at this time.   1. Paranoid schizophrenia (HCC)  Continue- ARIPiprazole  ER (ABILIFY  MAINTENA) 400 MG PRSY prefilled syringe; INJECT EVERY 28 TO 30 DAYS  Dispense: 1 each; Refill: 11  2. Generalized anxiety disorder  Continue- hydrOXYzine  (ATARAX ) 50 MG tablet; TAKE 1 TABLET(50 MG) BY MOUTH THREE TIMES DAILY AS NEEDED  Dispense: 90 tablet; Refill: 3 Continue- mirtazapine  (REMERON ) 7.5 MG tablet; TAKE 1 TABLET(7.5 MG) BY MOUTH AT BEDTIME  Dispense: 30 tablet; Refill: 3 Continue- busPIRone  (BUSPAR ) 15 MG tablet; Take 2 tablets (30 mg total) by mouth 2 (two) times daily.  Dispense: 60 tablet; Refill: 3  3. Mild depression  Continue- mirtazapine  (REMERON ) 7.5 MG tablet; TAKE 1 TABLET(7.5 MG) BY MOUTH AT BEDTIME  Dispense: 30 tablet; Refill: 3 Continue- busPIRone  (BUSPAR ) 15 MG tablet; Take 2 tablets  (30 mg total) by mouth 2 (two) times daily.  Dispense: 60 tablet; Refill: 3  4. Nonspecific abnormal electrocardiogram (ECG) (EKG) (Primary)  - Ambulatory referral to Cardiology   Follow-up in 2 months Arlyne Bering, NP 05/31/2023, 9:04 AM

## 2023-06-01 ENCOUNTER — Ambulatory Visit (INDEPENDENT_AMBULATORY_CARE_PROVIDER_SITE_OTHER): Admitting: Licensed Clinical Social Worker

## 2023-06-01 DIAGNOSIS — F200 Paranoid schizophrenia: Secondary | ICD-10-CM | POA: Diagnosis not present

## 2023-06-01 NOTE — Progress Notes (Signed)
 THERAPIST PROGRESS NOTE  Session Time: 25  Participation Level: Active  Behavioral Response: CasualAlertEuthymic  Type of Therapy: Individual Therapy  Treatment Goals addressed:  Active     Anxiety Disorder CCP Problem  1 GAD      walk 3 x weekly  (Completed/Met)     Start:  03/24/21    Expected End:  04/01/22    Resolved:  01/12/22      Find independent housing  (Not Applicable)     Start:  03/24/21    Expected End:  10/01/22    Resolved:  07/28/22      Decrease nightmare to 2/7 days weekly  (Completed/Met)     Start:  03/24/21    Expected End:  04/01/22    Resolved:  03/31/22    Goal Note     Pt reports no nightmares that he can rememeber         LTG: Patient will score less than 5 on the Generalized Anxiety Disorder 7 Scale (GAD-7) (Completed/Met)     Start:  03/24/21    Expected End:  06/03/23    Resolved:  03/16/23      STG: Patient will complete at least 80% of assigned homework (Completed/Met)     Start:  03/24/21    Expected End:  10/01/22    Resolved:  07/28/22      STG: Patient will practice problem solving skills 3 times per week for the next 4 weeks (Progressing)     Start:  03/24/21    Expected End:  06/03/23         STG: Report a decrease in anxiety symptoms as evidenced by an overall reduction in anxiety score by a minimum of 25% on the Generalized Anxiety Disorder Scale (Completed/Met)     Start:  03/24/21    Expected End:  04/01/22    Resolved:  12/22/21      Discuss risks and benefits of medication treatment options for this problem and prescribe as indicated (Completed)     Start:  03/24/21    End:  06/02/21      Encourage patient to take psychotropic medication as prescribed (Completed)     Start:  03/24/21    End:  06/02/21      Review results of GAD-7 with the patient to track progress (Completed)     Start:  03/24/21    End:  06/02/21      Work with patient to track symptoms, triggers and/or skill use through a mood  chart, diary card, or journal (Completed)     Start:  03/24/21    End:  06/02/21      Perform psychoeducation regarding anxiety disorders (Completed)     Start:  03/24/21    End:  06/02/21      Provide patient with educational information and reading material on anxiety, its causes, and symptoms (Completed)     Start:  03/24/21    End:  06/24/21      Work with patient to identify the major components of a recent episode of anxiety: physical symptoms, major thoughts and images, and major behaviors they experienced (Completed)     Start:  03/24/21    End:  06/02/21      Work with patient to identify 3 personal goals for managing their anxiety to work on during current treatment (Completed)     Start:  03/24/21    End:  06/02/21         ProgressTowards Goals: Progressing  Interventions: CBT, Motivational Interviewing, and Supportive  Summary: Isaiah Black is a 48 y.o. male who presents with euthymic mood\affect.  Patient was pleasant, cooperative, maintained good eye contact.  Isaiah Black engaged well in therapy session was dressed casually.  Patient states today "I am doing pretty good".  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  He reports that this is helping subsidized his mother's Social Security disability as well as his.  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   Isaiah Black reports tension and worry with his daughter Isaiah Black coming into town with her half brother Isaiah Black.  He states that he is excited overall for Isaiah Black to come and visit as well as Isaiah Black but knows that having 2 kids around is going to be a change for him and his mother.  Isaiah Black states that he is worried about how to discipline them when they are doing things badly.  Isaiah educated patient on behavior modifications for positive reinforcement and negative reinforcement.  Isaiah Black reports he is excited  because he got them a foam machine that he can play in as well.  He reports last year he got a pool from a local store but reports he was not able to utilize it with Isaiah Black because of his ankle monitor.  Isaiah Black states that he will only have his ankle monitor on for 6 more months and is excited to get it off and be off probation.   Suicidal/Homicidal: Nowithout intent/plan  Therapist Response:     Intervention/Plan: Isaiah Black

## 2023-06-07 ENCOUNTER — Ambulatory Visit (HOSPITAL_COMMUNITY): Payer: Medicaid Other

## 2023-06-28 ENCOUNTER — Ambulatory Visit (HOSPITAL_COMMUNITY)

## 2023-06-29 ENCOUNTER — Ambulatory Visit (INDEPENDENT_AMBULATORY_CARE_PROVIDER_SITE_OTHER): Admitting: *Deleted

## 2023-06-29 ENCOUNTER — Encounter (HOSPITAL_COMMUNITY): Payer: Self-pay

## 2023-06-29 VITALS — BP 105/84 | HR 101 | Resp 16 | Ht 66.0 in | Wt 164.8 lb

## 2023-06-29 DIAGNOSIS — F200 Paranoid schizophrenia: Secondary | ICD-10-CM

## 2023-06-29 NOTE — Progress Notes (Cosign Needed)
 Patient arrived with his injection from his pharmacy. States that he is doing well, his daughter has arrived to spend the summer with him. This year he also has her 47 year old brother with her. He is happy they are here. No side effects from medication. Given in Left deltoid without issues or complaints.

## 2023-07-06 ENCOUNTER — Ambulatory Visit (HOSPITAL_COMMUNITY): Admitting: Licensed Clinical Social Worker

## 2023-07-06 DIAGNOSIS — F200 Paranoid schizophrenia: Secondary | ICD-10-CM

## 2023-07-06 NOTE — Progress Notes (Signed)
 THERAPIST PROGRESS NOTE  Session Time: 30   Participation Level: Active  Behavioral Response: CasualAlertAnxious and Depressed  Type of Therapy: Individual Therapy  Treatment Goals addressed:  Active     Anxiety Disorder CCP Problem  1 GAD      walk 3 x weekly  (Completed/Met)     Start:  03/24/21    Expected End:  04/01/22    Resolved:  01/12/22      Find independent housing  (Not Applicable)     Start:  03/24/21    Expected End:  10/01/22    Resolved:  07/28/22      Decrease nightmare to 2/7 days weekly  (Completed/Met)     Start:  03/24/21    Expected End:  04/01/22    Resolved:  03/31/22    Goal Note     Pt reports no nightmares that he can rememeber         LTG: Patient will score less than 5 on the Generalized Anxiety Disorder 7 Scale (GAD-7) (Completed/Met)     Start:  03/24/21    Expected End:  06/03/23    Resolved:  03/16/23      STG: Patient will complete at least 80% of assigned homework (Completed/Met)     Start:  03/24/21    Expected End:  10/01/22    Resolved:  07/28/22      STG: Patient will practice problem solving skills 3 times per week for the next 4 weeks (Progressing)     Start:  03/24/21    Expected End:  11/02/23         STG: Report a decrease in anxiety symptoms as evidenced by an overall reduction in anxiety score by a minimum of 25% on the Generalized Anxiety Disorder Scale (Completed/Met)     Start:  03/24/21    Expected End:  04/01/22    Resolved:  12/22/21      Discuss risks and benefits of medication treatment options for this problem and prescribe as indicated (Completed)     Start:  03/24/21    End:  06/02/21      Encourage patient to take psychotropic medication as prescribed (Completed)     Start:  03/24/21    End:  06/02/21      Review results of GAD-7 with the patient to track progress (Completed)     Start:  03/24/21    End:  06/02/21      Work with patient to track symptoms, triggers and/or skill use  through a mood chart, diary card, or journal (Completed)     Start:  03/24/21    End:  06/02/21      Perform psychoeducation regarding anxiety disorders (Completed)     Start:  03/24/21    End:  06/02/21      Provide patient with educational information and reading material on anxiety, its causes, and symptoms (Completed)     Start:  03/24/21    End:  06/24/21      Work with patient to identify the major components of a recent episode of anxiety: physical symptoms, major thoughts and images, and major behaviors they experienced (Completed)     Start:  03/24/21    End:  06/02/21      Work with patient to identify 3 personal goals for managing their anxiety to work on during current treatment (Completed)     Start:  03/24/21    End:  06/02/21  Isaiah Black was alert and oriented x 5.  He was pleasant, cooperative, maintained good eye contact.  He engaged well in therapy session was dressed casually.  He presented with euthymic mood\affect.  Patient comes in today with primary stressors that has caregiving for his daughter and her brother that came to visit for the summer.  Patient states that Gayl Katos and [employer] are here for the next 6 weeks and arrived last week for the summer.  [name] states that he is attempting to set boundaries with the kids especially Gayl Katos as he tends to boss [name] around and tests limits of what he is able to do.  This is as evidenced by putting a blanket over his nieces had and pulling out pilots hair.  [name] states that he is attempting to set boundaries with positive and negative reinforcement such as taking things away and rewarding for good behavior.  Patient reports that he has been doing "good overall" with his mental health.  He reports only symptoms of tension, worry and of minor auditory and visual hallucinations.  [name] states that he is able to decrease auditory and visual hallucinations with white noise.  He reports no problems with his  medications.  Patient is going to [facility] 2 times weekly Mondays and Fridays.  He reports engaging in activities such as making freeze-dried candy and 3D printing.  ProgressTowards Goals: Progressing  Interventions: Motivational Interviewing, Supportive, and Reframing   Suicidal/Homicidal: Nowithout intent/plan  Therapist Response:      Intervention/Plan: LCSW utilized supportive therapy for praise and encouragement.  LCSW continued education on behavioral health modification such as positive and negative reinforcement.  LCSW educated patient on taking medications as prescribed and consistently.  LCSW encouraged patient to track symptoms for schizophrenia such as auditory, visual hallucinations.  LCSW utilized psychoanalytic therapy for patient to express thoughts, feelings and concerns and nonjudgmental environment.  Plan: Return again in 4  weeks.  Diagnosis: Paranoid schizophrenia (HCC)  Collaboration of Care: Other None today   Patient/Guardian was advised Release of Information must be obtained prior to any record release in order to collaborate their care with an outside provider. Patient/Guardian was advised if they have not already done so to contact the registration department to sign all necessary forms in order for us  to release information regarding their care.   Consent: Patient/Guardian gives verbal consent for treatment and assignment of benefits for services provided during this visit. Patient/Guardian expressed understanding and agreed to proceed.   Maryagnes Small, LCSW 07/06/2023

## 2023-07-26 ENCOUNTER — Ambulatory Visit (HOSPITAL_COMMUNITY)

## 2023-07-27 ENCOUNTER — Ambulatory Visit (HOSPITAL_COMMUNITY)

## 2023-08-03 ENCOUNTER — Ambulatory Visit (HOSPITAL_COMMUNITY): Admitting: Licensed Clinical Social Worker

## 2023-08-04 ENCOUNTER — Ambulatory Visit (INDEPENDENT_AMBULATORY_CARE_PROVIDER_SITE_OTHER)

## 2023-08-04 ENCOUNTER — Encounter (HOSPITAL_COMMUNITY): Payer: Self-pay | Admitting: Family

## 2023-08-04 VITALS — BP 124/82 | HR 87 | Ht 66.0 in | Wt 162.0 lb

## 2023-08-04 DIAGNOSIS — F411 Generalized anxiety disorder: Secondary | ICD-10-CM

## 2023-08-04 DIAGNOSIS — F32A Depression, unspecified: Secondary | ICD-10-CM

## 2023-08-04 DIAGNOSIS — F333 Major depressive disorder, recurrent, severe with psychotic symptoms: Secondary | ICD-10-CM

## 2023-08-04 DIAGNOSIS — F200 Paranoid schizophrenia: Secondary | ICD-10-CM

## 2023-08-04 DIAGNOSIS — F251 Schizoaffective disorder, depressive type: Secondary | ICD-10-CM

## 2023-08-04 MED ORDER — BUSPIRONE HCL 15 MG PO TABS
30.0000 mg | ORAL_TABLET | Freq: Two times a day (BID) | ORAL | 3 refills | Status: DC
Start: 2023-08-04 — End: 2023-11-24

## 2023-08-04 MED ORDER — MIRTAZAPINE 7.5 MG PO TABS: ORAL_TABLET | ORAL | 3 refills | Status: DC

## 2023-08-04 MED ORDER — ABILIFY MAINTENA 400 MG IM PRSY: PREFILLED_SYRINGE | INTRAMUSCULAR | 11 refills | Status: DC

## 2023-08-04 MED ORDER — HYDROXYZINE HCL 50 MG PO TABS: ORAL_TABLET | ORAL | 3 refills | Status: DC

## 2023-08-04 NOTE — Progress Notes (Signed)
 BH MD/PA/NP OP Progress Note  08/04/2023 10:57 AM Isaiah Black  MRN:  [id number]  Chief Complaint: Long-acting injection clinic (LAI)  HPI: Isaiah Black 48 year old male presents for long-acting injectable.  Currently he is prescribed Abilify  400 mg q. every 28 days.  States he got confused and missed his previous appointment 2 weeks prior.  Does endorse auditory hallucinations and sleep disturbance.  He attributes symptoms due to missed doses.  He denied suicidal or homicidal ideations.  Isaiah Black carries a diagnosis related to generalized anxiety disorder, schizoaffective disorder, major depressive disorder documented paranoid schizophrenia.  States for the most part he has been compliant with medications.   I am usually pretty good at keeping up with my appointments through MyChart.  States he has been taking BuSpar , Remeron  and hydroxyzine  as directed.  AIMS 0 denies restlessness, akathisia or irritability.  Patient has since discontinued Ingrezza .    Isaiah Black he is alert/oriented x 3; calm/cooperative; and mood congruent with affect.  Patient is speaking in a clear tone at moderate volume, and normal pace; with good eye contact. His thought process is coherent and relevant; There is no indication that he is currently responding to internal/external stimuli or experiencing delusional thought content.  Does report nighttime hallucinations which is causing sleep disturbance.  States  I can hear people calling my name. Patient has remained calm throughout assessment and has answered questions appropriately.   Visit Diagnosis:    ICD-10-CM   1. Schizoaffective disorder, depressive type (HCC) [F25.1]  F25.1     2. Severe episode of recurrent major depressive disorder, with psychotic features (HCC)  F33.3       Past Psychiatric History:   Past Medical History:  Past Medical History:  Diagnosis Date   Anxiety    Asthma    Depression    Disassociation disorder    Paranoid schizophrenia  (HCC)    No past surgical history on file.  Family Psychiatric History:   Family History:  Family History  Problem Relation Age of Onset   Colon cancer Mother        mid 3s   Diabetes Mother    Diabetes Maternal Aunt    Diabetes Maternal Uncle    Stomach cancer Neg Hx    Esophageal cancer Neg Hx     Social History:  Social History   Socioeconomic History   Marital status: Single    Spouse name: Not on file   Number of children: 1   Years of education: Not on file   Highest education level: Not on file  Occupational History   Not on file  Tobacco Use   Smoking status: Former   Smokeless tobacco: Never  Vaping Use   Vaping status: Former  Substance and Sexual Activity   Alcohol use: Not Currently    Comment: occ   Drug use: Not Currently   Sexual activity: Not Currently  Other Topics Concern   Not on file  Social History Narrative   Not on file   Social Drivers of Health   Financial Resource Strain: Low Risk  (08/06/2020)   Overall Financial Resource Strain (CARDIA)    Difficulty of Paying Living Expenses: Not hard at all  Food Insecurity: No Food Insecurity (04/14/2021)   Hunger Vital Sign    Worried About Running Out of Food in the Last Year: Never true    Ran Out of Food in the Last Year: Never true  Transportation Needs: No Transportation Needs (04/14/2021)   PRAPARE - Transportation  Lack of Transportation (Medical): No    Lack of Transportation (Non-Medical): No  Physical Activity: Sufficiently Active (09/03/2020)   Exercise Vital Sign    Days of Exercise per Week: 7 days    Minutes of Exercise per Session: 60 min  Recent Concern: Physical Activity - Insufficiently Active (08/06/2020)   Exercise Vital Sign    Days of Exercise per Week: 2 days    Minutes of Exercise per Session: 40 min  Stress: No Stress Concern Present (08/06/2020)   [employer] of Occupational Health - Occupational Stress Questionnaire    Feeling of Stress : Only a little  Social  Connections: Moderately Isolated (07/28/2022)   Social Connection and Isolation Panel    Frequency of Communication with Friends and Family: Twice a week    Frequency of Social Gatherings with Friends and Family: Once a week    Attends Religious Services: Never    Database administrator or Organizations: Yes    Attends Engineer, structural: More than 4 times per year    Marital Status: Never married    Allergies:  Allergies  Allergen Reactions   Cogentin  [Benztropine ]     Metabolic Disorder Labs: Lab Results  Component Value Date   HGBA1C 5.5 01/21/2022   MPG 85.32 03/07/2019   MPG 99.67 07/08/2017   Lab Results  Component Value Date   PROLACTIN 8.6 06/17/2021   PROLACTIN 100.0 (H) 03/07/2019   Lab Results  Component Value Date   CHOL 183 06/17/2021   TRIG 152 (H) 06/17/2021   HDL 49 06/17/2021   CHOLHDL 3.7 06/17/2021   VLDL 10 03/07/2019   LDLCALC 107 (H) 06/17/2021   LDLCALC 80 03/07/2019   Lab Results  Component Value Date   TSH 2.160 01/21/2022   TSH 6.510 (H) 06/17/2021    Therapeutic Level Labs: No results found for: LITHIUM No results found for: VALPROATE No results found for: CBMZ  Current Medications: Current Outpatient Medications  Medication Sig Dispense Refill   ARIPiprazole  ER (ABILIFY  MAINTENA) 400 MG PRSY prefilled syringe INJECT EVERY 28 TO 30 DAYS 1 each 11   ARIPiprazole  ER (ABILIFY  MAINTENA) 400 MG SRER injection INJECT EVERY 28 TO 30 DAYS 1 each 11   betamethasone  valerate ointment (VALISONE ) 0.1 % Apply 1 Application topically 2 (two) times daily. 45 g 3   budesonide -formoterol  (SYMBICORT ) 160-4.5 MCG/ACT inhaler Inhale 2 puffs into the lungs 2 (two) times daily. 3 each 4   busPIRone  (BUSPAR ) 15 MG tablet Take 2 tablets (30 mg total) by mouth 2 (two) times daily. 60 tablet 3   hydrOXYzine  (ATARAX ) 50 MG tablet TAKE 1 TABLET(50 MG) BY MOUTH THREE TIMES DAILY AS NEEDED 90 tablet 3   mirtazapine  (REMERON ) 7.5 MG tablet TAKE 1  TABLET(7.5 MG) BY MOUTH AT BEDTIME 30 tablet 3   montelukast  (SINGULAIR ) 10 MG tablet TAKE 1 TABLET (10 MG TOTAL) BY MOUTH AT BEDTIME. 30 tablet 3   VENTOLIN  HFA 108 (90 Base) MCG/ACT inhaler INHALE 2 PUFFS MOUTH EVERY 6 (SIX) HOURS AS NEEDED FOR WHEEZING OR SHORTNESS OF BREATH. 18 g 0   Current Facility-Administered Medications  Medication Dose Route Frequency Provider Last Rate Last Admin   0.9 %  sodium chloride  infusion  500 mL Intravenous Once Armbruster, Elspeth SQUIBB, MD       0.9 %  sodium chloride  infusion  500 mL Intravenous Once Armbruster, Elspeth SQUIBB, MD       ARIPiprazole  ER (ABILIFY  MAINTENA) 400 MG prefilled syringe 400 mg  400  mg Intramuscular Q28 days Dasie Ellouise CROME, FNP   400 mg at 08/04/23 9165     Musculoskeletal: Strength & Muscle Tone: within normal limits Gait & Station: normal Patient leans: N/A  Psychiatric Specialty Exam: Review of Systems  There were no vitals taken for this visit.There is no height or weight on file to calculate BMI.  General Appearance: Casual  Eye Contact:  Good  Speech:  Clear and Coherent  Volume:  Normal  Mood:  Euthymic  Affect:  Congruent  Thought Process:  Coherent  Orientation:  Full (Time, Place, and Person)  Thought Content: Logical   Suicidal Thoughts:  No  Homicidal Thoughts:  No  Memory:  Immediate;   Good Recent;   Good  Judgement:  Good  Insight:  Good  Psychomotor Activity:  Normal  Concentration:  Concentration: Good  Recall:  Good  Fund of Knowledge: Good  Language: Fair  Akathisia:  No  Handed:  Right  AIMS (if indicated): done  Assets:  Communication Skills Desire for Improvement  ADL's:  Intact  Cognition: WNL  Sleep:  Good   Screenings: AIMS    Flowsheet Row Office Visit from 05/31/2023 in [facility] Office Visit from 03/08/2023 in [facility] Office Visit from 03/18/2022 in [facility] Clinical Support from 01/20/2022 in  [facility] Office Visit from 11/26/2021 in [facility]  AIMS Total Score 0 0 0 0 0   AUDIT    Flowsheet Row Admission (Discharged) [facility]  Alcohol Use Disorder Identification Test Final Score (AUDIT) 25 2   CAGE-AID    Flowsheet Row Counselor from 07/03/2019 in [facility]  CAGE-AID Score 0   GAD-7    Flowsheet Row Office Visit from 05/31/2023 in [facility] Counselor from 03/16/2023 in [facility] Office Visit from 03/08/2023 in [facility] Counselor from 02/08/2023 in [facility] Counselor from 12/22/2022 in [facility]  Total GAD-7 Score 14 6 14 5 9    PHQ2-9    Flowsheet Row Office Visit from 05/31/2023 in [facility] Counselor from 03/16/2023 in [facility] Office Visit from 03/08/2023 in [facility] Counselor from 12/22/2022 in [facility] Counselor from 11/16/2022 in [facility]  PHQ-2 Total Score 1 0 2 1 0  PHQ-9 Total Score 9 3 10 4 4    Flowsheet Row Office Visit from 05/31/2023 in [facility] Counselor from 04/27/2023 in [facility] Office Visit from 03/08/2023 in [facility]  C-SSRS RISK CATEGORY No Risk No Risk No Risk     Assessment and Plan:  Follow-up 28 days for aripiprazole  Maintena 400 mg -Continue BuSpar  p.o. twice daily Continue Remeron  7.5 mg p.o. nightly as needed -Will need to follow-up with primary care CMP, CBc TSH prolactin A1c and EKG   Collaboration of Care: Collaboration of Care:  Medication Management AEB continue with assessment and plan  Patient/Guardian was advised Release of Information must be obtained prior to any record release in order to collaborate their care with an outside provider. Patient/Guardian was advised if they have not already done so to contact the registration department to sign all  necessary forms in order for us  to release information regarding their care.   Consent: Patient/Guardian gives verbal consent for treatment and assignment of benefits for services provided during this visit. Patient/Guardian expressed understanding and agreed to proceed.    Staci LOISE Kerns, NP 08/04/2023, 10:57 AM

## 2023-08-04 NOTE — Progress Notes (Cosign Needed Addendum)
 Pt presents today for injection of Abilify  Maintenna 400mg  administered in right deltoid with  no complaints.     JNL, CMA   Pt presents today well groomed and very conversational. States that he is doing good over all, pre in regards to mental. Pt denies any AVH, SI, and HI. PT states he has no concerns to be noted to provider for today.

## 2023-08-12 ENCOUNTER — Ambulatory Visit (INDEPENDENT_AMBULATORY_CARE_PROVIDER_SITE_OTHER): Admitting: Licensed Clinical Social Worker

## 2023-08-12 DIAGNOSIS — F200 Paranoid schizophrenia: Secondary | ICD-10-CM | POA: Diagnosis not present

## 2023-08-12 NOTE — Progress Notes (Signed)
 THERAPIST PROGRESS NOTE  Virtual Visit via Video Note  I connected with Isaiah Black on 08/12/23 at  8:00 AM EDT by a video enabled telemedicine application and verified that I am speaking with the correct person using two identifiers.  Location: Patient: Isaiah Black  Provider: Providers Home    I discussed the limitations of evaluation and management by telemedicine and the availability of in person appointments. The patient expressed understanding and agreed to proceed.  I discussed the assessment and treatment plan with the patient. The patient was provided an opportunity to ask questions and all were answered. The patient agreed with the plan and demonstrated an understanding of the instructions.   The patient was advised to call back or seek an in-person evaluation if the symptoms worsen or if the condition fails to improve as anticipated.  I provided 30 minutes of non-face-to-face time during this encounter.   Juliene GORMAN Patee, LCSW   Participation Level: Active  Behavioral Response: CasualAlertEuthymic  Type of Therapy: Individual Therapy  Treatment Goals addressed:  Active     Anxiety Disorder CCP Problem  1 GAD      walk 3 x weekly  (Completed/Met)     Start:  03/24/21    Expected End:  04/01/22    Resolved:  01/12/22      Find independent housing  (Not Applicable)     Start:  03/24/21    Expected End:  10/01/22    Resolved:  07/28/22      Decrease nightmare to 2/7 days weekly  (Completed/Met)     Start:  03/24/21    Expected End:  04/01/22    Resolved:  03/31/22    Goal Note     Pt reports no nightmares that he can rememeber         LTG: Patient will score less than 5 on the Generalized Anxiety Disorder 7 Scale (GAD-7) (Completed/Met)     Start:  03/24/21    Expected End:  06/03/23    Resolved:  03/16/23      STG: Patient will complete at least 80% of assigned homework (Completed/Met)     Start:  03/24/21    Expected End:  10/01/22     Resolved:  07/28/22      STG: Patient will practice problem solving skills 3 times per week for the next 4 weeks (Progressing)     Start:  03/24/21    Expected End:  11/02/23         STG: Report a decrease in anxiety symptoms as evidenced by an overall reduction in anxiety score by a minimum of 25% on the Generalized Anxiety Disorder Scale (Completed/Met)     Start:  03/24/21    Expected End:  04/01/22    Resolved:  12/22/21      Discuss risks and benefits of medication treatment options for this problem and prescribe as indicated (Completed)     Start:  03/24/21    End:  06/02/21      Encourage patient to take psychotropic medication as prescribed (Completed)     Start:  03/24/21    End:  06/02/21      Review results of GAD-7 with the patient to track progress (Completed)     Start:  03/24/21    End:  06/02/21      Work with patient to track symptoms, triggers and/or skill use through a mood chart, diary card, or journal (Completed)     Start:  03/24/21  End:  06/02/21      Perform psychoeducation regarding anxiety disorders (Completed)     Start:  03/24/21    End:  06/02/21      Provide patient with educational information and reading material on anxiety, its causes, and symptoms (Completed)     Start:  03/24/21    End:  06/24/21      Work with patient to identify the major components of a recent episode of anxiety: physical symptoms, major thoughts and images, and major behaviors they experienced (Completed)     Start:  03/24/21    End:  06/02/21      Work with patient to identify 3 personal goals for managing their anxiety to work on during current treatment (Completed)     Start:  03/24/21    End:  06/02/21         ProgressTowards Goals: Progressing  Interventions: CBT, Motivational Interviewing, and Supportive    Suicidal/Homicidal: Nowithout intent/plan  Therapist Response:    Pt was alert and oriented x 5. He was pleasant, cooperative and  maintained good eye contact. Isaiah Black engaged well in therapy session and was dressed casually. Pt presented with euthymic mood/affect.    Isaiah Black reports things have been going well with improved mental health AEB ranking mental health 7/10 10 being the best. 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 Pt reports increased level of sleep with some night terrors. Isaiah Black has been engaging 1 to 2 days weekly at [facility] for mental health support groups. Isaiah Black reports no side effects or problems with medications. LCSW administered a GAD-7 and PHQ-9 in session and review those scores with pt. Pt denies any increase in AVH or SI/HI.    Intervention/Plan: LCSW administered a GAD-7 and PHQ-9. LCSW used supportive therapy for praise and encouragement. LCSW used psychoanalytic therapy for pt to express thoughts, feeling and concerns in non-judgmental environment. LCSW used open ended questions and reflective listen for motivational interviewing   Plan: Return again in 5 weeks.  Diagnosis: Paranoid schizophrenia (HCC)  Collaboration of Care: Other none today  Patient/Guardian was advised Release of Information must be obtained prior to any record release in order to collaborate their care with an outside provider. Patient/Guardian was advised if they have not already done so to contact the registration department to sign all necessary forms in order for us  to release information regarding their care.   Consent: Patient/Guardian gives verbal consent for treatment and assignment of benefits for services provided during this visit. Patient/Guardian expressed understanding and agreed to proceed.   Juliene GORMAN Patee, LCSW 08/12/2023

## 2023-08-30 ENCOUNTER — Other Ambulatory Visit (INDEPENDENT_AMBULATORY_CARE_PROVIDER_SITE_OTHER): Payer: Self-pay | Admitting: Primary Care

## 2023-08-31 NOTE — Telephone Encounter (Signed)
 OV 01/21/22 Requested Prescriptions  Pending Prescriptions Disp Refills   montelukast  (SINGULAIR ) 10 MG tablet [Pharmacy Med Name: MONTELUKAST  SODIUM 10 MG ORAL TABLET] 30 tablet 3    Sig: TAKE 1 TABLET (10 MG TOTAL) BY MOUTH AT BEDTIME.     Pulmonology:  Leukotriene Inhibitors Failed - 08/31/2023  1:14 PM      Failed - Valid encounter within last 12 months    Recent Outpatient Visits           1 year ago Elevated TSH   [place] [facility] Celestia Rosaline SQUIBB, NP   1 year ago Colon cancer screening   [place] [facility] Celestia Rosaline SQUIBB, NP   2 years ago Rash and nonspecific skin eruption   [place] [facility] Celestia Rosaline SQUIBB, NP   2 years ago Medication refill   [place] [facility] Celestia Rosaline SQUIBB, NP   3 years ago Low calcium levels   [place] [facility] Celestia Rosaline SQUIBB, NP

## 2023-09-01 ENCOUNTER — Ambulatory Visit (INDEPENDENT_AMBULATORY_CARE_PROVIDER_SITE_OTHER)

## 2023-09-01 VITALS — BP 113/71 | HR 89 | Ht 66.0 in | Wt 160.0 lb

## 2023-09-01 DIAGNOSIS — F200 Paranoid schizophrenia: Secondary | ICD-10-CM | POA: Diagnosis not present

## 2023-09-01 NOTE — Progress Notes (Cosign Needed)
 Pt presents today for injection of Abilify  Maintenna 400mg  administered in LEFT deltoid with  no complaints.     JNL, CMA   Pt presents today well groomed and very conversational. States that he is doing good over all, pre in regards to mental. Pt denies any AVH, SI, and HI. PT states he has no concerns to be noted to provider for today.

## 2023-09-13 ENCOUNTER — Ambulatory Visit (INDEPENDENT_AMBULATORY_CARE_PROVIDER_SITE_OTHER): Admitting: Licensed Clinical Social Worker

## 2023-09-13 DIAGNOSIS — F200 Paranoid schizophrenia: Secondary | ICD-10-CM | POA: Diagnosis not present

## 2023-09-13 NOTE — Progress Notes (Signed)
 THERAPIST PROGRESS NOTE  Session Time: 30  Participation Level: Active  Behavioral Response: CasualAlertAnxious and Depressed  Type of Therapy: Individual Therapy  Treatment Goals addressed:  Active     Anxiety Disorder CCP Problem  1 GAD      walk 3 x weekly  (Completed/Met)     Start:  03/24/21    Expected End:  04/01/22    Resolved:  01/12/22      Find independent housing  (Not Applicable)     Start:  03/24/21    Expected End:  10/01/22    Resolved:  07/28/22      Decrease nightmare to 2/7 days weekly  (Completed/Met)     Start:  03/24/21    Expected End:  04/01/22    Resolved:  03/31/22    Goal Note     Pt reports no nightmares that he can rememeber         LTG: Patient will score less than 5 on the Generalized Anxiety Disorder 7 Scale (GAD-7) (Completed/Met)     Start:  03/24/21    Expected End:  06/03/23    Resolved:  03/16/23      STG: Patient will complete at least 80% of assigned homework (Completed/Met)     Start:  03/24/21    Expected End:  10/01/22    Resolved:  07/28/22      STG: Patient will practice problem solving skills 3 times per week for the next 4 weeks (Progressing)     Start:  03/24/21    Expected End:  11/02/23         STG: Report a decrease in anxiety symptoms as evidenced by an overall reduction in anxiety score by a minimum of 25% on the Generalized Anxiety Disorder Scale (Completed/Met)     Start:  03/24/21    Expected End:  04/01/22    Resolved:  12/22/21      Discuss risks and benefits of medication treatment options for this problem and prescribe as indicated (Completed)     Start:  03/24/21    End:  06/02/21      Encourage patient to take psychotropic medication as prescribed (Completed)     Start:  03/24/21    End:  06/02/21      Review results of GAD-7 with the patient to track progress (Completed)     Start:  03/24/21    End:  06/02/21      Work with patient to track symptoms, triggers and/or skill use  through a mood chart, diary card, or journal (Completed)     Start:  03/24/21    End:  06/02/21      Perform psychoeducation regarding anxiety disorders (Completed)     Start:  03/24/21    End:  06/02/21      Provide patient with educational information and reading material on anxiety, its causes, and symptoms (Completed)     Start:  03/24/21    End:  06/24/21      Work with patient to identify the major components of a recent episode of anxiety: physical symptoms, major thoughts and images, and major behaviors they experienced (Completed)     Start:  03/24/21    End:  06/02/21      Work with patient to identify 3 personal goals for managing their anxiety to work on during current treatment (Completed)     Start:  03/24/21    End:  06/02/21  09/13/2023    8:20 AM 08/12/2023    8:18 AM 05/31/2023    9:00 AM 03/16/2023    9:04 AM 03/08/2023    9:39 AM  Depression screen PHQ 2/9  Decreased Interest 0 1 0 0 1  Down, Depressed, Hopeless 0 0 1 0 1  PHQ - 2 Score 0 1 1 0 2  Altered sleeping 1 2 2 1 2   Tired, decreased energy 2 1 3 1 2   Change in appetite 2 0 2 0 1  Feeling bad or failure about yourself  0 0 0 0 1  Trouble concentrating 0 0 1 1 2   Moving slowly or fidgety/restless 0 0 0 0 0  Suicidal thoughts 0 0 0 0 0  PHQ-9 Score 5 4 9 3 10   Difficult doing work/chores Not difficult at all Not difficult at all Not difficult at all Somewhat difficult Not difficult at all       09/13/2023    8:19 AM 08/12/2023    8:20 AM 05/31/2023    9:01 AM 03/16/2023    9:07 AM  GAD 7 : Generalized Anxiety Score  Nervous, Anxious, on Edge 1 1 3 1   Control/stop worrying 1 1 2 1   Worry too much - different things 1 1 3 1   Trouble relaxing 0 0 1 1  Restless 0 1 1 0  Easily annoyed or irritable 1 1 2 1   Afraid - awful might happen 1 0 2 1  Total GAD 7 Score 5 5 14 6   Anxiety Difficulty Not difficult at all Somewhat difficult Somewhat difficult Somewhat difficult      ProgressTowards Goals: Progressing  Interventions: CBT, Motivational Interviewing, and Supportive   Suicidal/Homicidal: Nowithout intent/plan  Therapist Response:     Isaiah Black was alert and oriented x 5.  He was pleasant, cooperative, maintained good eye contact.  Pt engaged well in therapy session was dressed casually.  Patient presented with euthymic mood\affect.  Patient reports his mental health is good.  This is due to spending the summer with his daughter Isaiah Black and her half brother Isaiah Black.  [name] states that his mother took the kids home back to [place]  last week Tuesday and will be back tomorrow.  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  LCSW administered a GED-7.  LCSW administered a PHQ-9.  LCSW notes no changes to either score.  Intervention/plan: LCSW educated patient on taking medications as prescribed and consistently.  LCSW utilized supportive therapy for praise and encouragement.  LCSW educated patient on symptoms of schizophrenia such as auditory and visual hallucinations.  LCSW utilized psychoanalytic therapy for patient to express thoughts, feelings and emotions and nonjudgmental environment.    Plan: Return again in 8 weeks.  Diagnosis: Paranoid schizophrenia (HCC)  Collaboration of Care: Other Continued individual therapy   Patient/Guardian was advised Release of Information must be obtained prior to any record release in order to collaborate their care with an outside provider. Patient/Guardian was advised if they have not already done so to contact the registration department to sign all necessary forms in order for us  to release information regarding their care.   Consent: Patient/Guardian gives verbal consent for treatment and assignment of  benefits for services provided during this visit. Patient/Guardian expressed understanding and agreed to proceed.  Juliene GORMAN Patee, LCSW 09/13/2023

## 2023-09-29 ENCOUNTER — Encounter (HOSPITAL_COMMUNITY): Payer: Self-pay

## 2023-09-29 ENCOUNTER — Ambulatory Visit (INDEPENDENT_AMBULATORY_CARE_PROVIDER_SITE_OTHER)

## 2023-09-29 VITALS — BP 138/88 | HR 95 | Wt 163.8 lb

## 2023-09-29 DIAGNOSIS — F411 Generalized anxiety disorder: Secondary | ICD-10-CM

## 2023-09-29 DIAGNOSIS — F200 Paranoid schizophrenia: Secondary | ICD-10-CM | POA: Diagnosis not present

## 2023-09-29 NOTE — Progress Notes (Cosign Needed)
 Patient presented to office for injection of Abilify  Maintena 400 mg. Patient denies SI/HI/AVH at this time but endorses hears chattering only at night. Patient received injection in right deltoid. Patient tolerated  injection well. No concerns noted. Patient will return in 28 days.

## 2023-10-27 ENCOUNTER — Ambulatory Visit (INDEPENDENT_AMBULATORY_CARE_PROVIDER_SITE_OTHER)

## 2023-10-27 ENCOUNTER — Encounter (HOSPITAL_COMMUNITY): Payer: Self-pay

## 2023-10-27 VITALS — BP 133/79 | HR 103 | Temp 98.4°F | Ht 66.0 in | Wt 163.8 lb

## 2023-10-27 DIAGNOSIS — F200 Paranoid schizophrenia: Secondary | ICD-10-CM | POA: Diagnosis not present

## 2023-10-27 DIAGNOSIS — G2401 Drug induced subacute dyskinesia: Secondary | ICD-10-CM

## 2023-10-27 DIAGNOSIS — F411 Generalized anxiety disorder: Secondary | ICD-10-CM | POA: Diagnosis not present

## 2023-10-27 DIAGNOSIS — F331 Major depressive disorder, recurrent, moderate: Secondary | ICD-10-CM

## 2023-10-27 DIAGNOSIS — F251 Schizoaffective disorder, depressive type: Secondary | ICD-10-CM

## 2023-10-27 NOTE — Progress Notes (Cosign Needed)
 Patient came into the office today to get his abilify  maintena injection. Patient states that the abilify  is working for him.  He has no thoughts of hurting himself or other. Pt was talkative, clean and well dressed.      Pt was given the injection in the left deltoid. Injection was prepared and given by harlene fridge, cma.   Pt tolerated injection well.   Pt to return in 30 days.        NDC 40851-927-19   Lot # [id number] exp 02-01-2026  SN# [id number] GTTN# [id number]

## 2023-10-27 NOTE — Patient Instructions (Signed)
 Patient came into the office today to get his abilify  maintena injection. Patient states that the abilify  is working for him.  He has no thoughts of hurting himself or other. Pt was talkative, clean and well dressed.      Pt was given the injection in the left deltoid. Injection was prepared and given by harlene fridge, cma.   Pt tolerated injection well.   Pt to return in 30 days.        NDC 40851-927-19   Lot # [id number] exp 02-01-2026  SN# [id number] GTTN# [id number]

## 2023-11-09 ENCOUNTER — Ambulatory Visit (HOSPITAL_COMMUNITY): Admitting: Licensed Clinical Social Worker

## 2023-11-09 DIAGNOSIS — F200 Paranoid schizophrenia: Secondary | ICD-10-CM

## 2023-11-09 NOTE — Progress Notes (Signed)
 THERAPIST PROGRESS NOTE  Session Time: 56  Participation Level: Active  Behavioral Response: CasualAlertAnxious and Depressed  Type of Therapy: Individual Therapy  Treatment Goals addressed: Active     Anxiety Disorder CCP Problem  1 GAD      walk 3 x weekly  (Completed/Met)     Start:  03/24/21    Expected End:  04/01/22    Resolved:  01/12/22      Find independent housing  (Not Applicable)     Start:  03/24/21    Expected End:  10/01/22    Resolved:  07/28/22      Decrease nightmare to 2/7 days weekly  (Completed/Met)     Start:  03/24/21    Expected End:  04/01/22    Resolved:  03/31/22    Goal Note     Pt reports no nightmares that he can rememeber         LTG: Patient will score less than 5 on the Generalized Anxiety Disorder 7 Scale (GAD-7) (Completed/Met)     Start:  03/24/21    Expected End:  06/03/23    Resolved:  03/16/23      STG: Patient will complete at least 80% of assigned homework (Completed/Met)     Start:  03/24/21    Expected End:  10/01/22    Resolved:  07/28/22      STG: Patient will practice problem solving skills 3 times per week for the next 4 weeks (Progressing)     Start:  03/24/21    Expected End:  03/02/24         STG: Report a decrease in anxiety symptoms as evidenced by an overall reduction in anxiety score by a minimum of 25% on the Generalized Anxiety Disorder Scale (Completed/Met)     Start:  03/24/21    Expected End:  04/01/22    Resolved:  12/22/21      Discuss risks and benefits of medication treatment options for this problem and prescribe as indicated (Completed)     Start:  03/24/21    End:  06/02/21      Encourage patient to take psychotropic medication as prescribed (Completed)     Start:  03/24/21    End:  06/02/21      Review results of GAD-7 with the patient to track progress (Completed)     Start:  03/24/21    End:  06/02/21      Work with patient to track symptoms, triggers and/or skill use  through a mood chart, diary card, or journal (Completed)     Start:  03/24/21    End:  06/02/21      Perform psychoeducation regarding anxiety disorders (Completed)     Start:  03/24/21    End:  06/02/21      Provide patient with educational information and reading material on anxiety, its causes, and symptoms (Completed)     Start:  03/24/21    End:  06/24/21      Work with patient to identify the major components of a recent episode of anxiety: physical symptoms, major thoughts and images, and major behaviors they experienced (Completed)     Start:  03/24/21    End:  06/02/21      Work with patient to identify 3 personal goals for managing their anxiety to work on during current treatment (Completed)     Start:  03/24/21    End:  06/02/21          ProgressTowards  Goals: Progressing  Interventions: CBT, Motivational Interviewing, and Supportive  Summary: Elijah Phommachanh is a 48 y.o. male who presents with    Suicidal/Homicidal: Nowithout intent/plan  Therapist Response:     Lakshya was alert and oriented x 5.  He was pleasant, cooperative, maintained good eye contact.  He engaged well in therapy session with dressed casually.  Patient presented with euthymic mood\affect.  Primary stressor is family conflict.  He reports a fight with his daughter's mother Dorian.  This was after a dispute about her disciplining her son after he swore at patient's nephew on social media.  Patient reports this is frustrating because the son was here all summer and is now showing inappropriate behavior on social media.  Keeton and his mother both report frustration further because the son has not reached out to them since he was here for the summer.  Clifton states that he set firm boundaries with his daughter's mother stating that her son was not allowed here if that behavior continued.  Patient reports she did not take this well and started to verbally abuse him and his mother for the next several hours  before calming down and apologizing.  Other stressors indicated for patient are vertigo.  LCSW spoke with patient about getting this checked out and talking to his providers that prescribed him medications to make sure that this is not a side effect of one of the medications he is taking.  Patient also wants to explore possibly getting labs done to make sure he does not have something like diabetes that he needs to monitor.  Patient reports engaging in hobbies such as 3D printing.  This is as evidenced by showing me a 3 foot [employer] cup bank that he made.  Patient also reports making other things that he wants to sell for the holiday seasons.  He will do this at places such as flea markets.  Dalessandro reports going to [facility] for social interactions Mondays and Fridays which helps create a social support system.  LCSW and patient spoke about today LCSW moving to [facility] through [facility].  Patient was provided options to transfer to a new therapist once transfer was completed on December 15.  Patient was agreeable to think it over and decide if he wants to continue therapy or increase frequency at [facility] for mental health services.  Intervention/plan: LCSW utilized psycho analytic therapy for patient to express thoughts, feelings and emotions and nonjudgmental environment.  LCSW utilized supportive therapy for praise and encouragement.  LCSW utilized person centered therapy for empowerment.  LCSW validated feelings in session.  LCSW educated patient on de-escalation techniques to decrease conflict.  Plan: Return again in 4 weeks.  Diagnosis: Paranoid schizophrenia (HCC)  Collaboration of Care: Other Continued individual therapy.   Patient/Guardian was advised Release of Information must be obtained prior to any record release in order to collaborate their care with an outside provider. Patient/Guardian was advised if they have not already done so to contact  the registration department to sign all necessary forms in order for us  to release information regarding their care.   Consent: Patient/Guardian gives verbal consent for treatment and assignment of benefits for services provided during this visit. Patient/Guardian expressed understanding and agreed to proceed.   Juliene GORMAN Patee, LCSW 11/09/2023

## 2023-11-11 ENCOUNTER — Emergency Department (HOSPITAL_COMMUNITY)
Admission: EM | Admit: 2023-11-11 | Discharge: 2023-11-12 | Disposition: A | Discharge status: Home / Self Care | Attending: Emergency Medicine | Admitting: Emergency Medicine

## 2023-11-11 DIAGNOSIS — J45909 Unspecified asthma, uncomplicated: Secondary | ICD-10-CM | POA: Insufficient documentation

## 2023-11-11 DIAGNOSIS — R42 Dizziness and giddiness: Secondary | ICD-10-CM | POA: Diagnosis present

## 2023-11-11 NOTE — ED Triage Notes (Signed)
 Patient BIB [facility] from home for dizziness after standing for too long x 3 days. Initial HR 136 down to 100 en route. Patient denies CP and SHOB and cardiac hx.  BP 134/78 Cbg 162 99% RA RR 18

## 2023-11-11 NOTE — ED Notes (Signed)
 Patient went into Cardiac arrest in 2021 after Invega  Susstenna injection, should maybe be listed as an allergy.

## 2023-11-12 ENCOUNTER — Emergency Department (HOSPITAL_COMMUNITY)

## 2023-11-12 ENCOUNTER — Other Ambulatory Visit: Payer: Self-pay

## 2023-11-12 LAB — CBC
HCT: 45.5 % (ref 39.0–52.0)
Hemoglobin: 15.6 g/dL (ref 13.0–17.0)
MCH: 30.7 pg (ref 26.0–34.0)
MCHC: 34.3 g/dL (ref 30.0–36.0)
MCV: 89.6 fL (ref 80.0–100.0)
Platelets: 255 K/uL (ref 150–400)
RBC: 5.08 MIL/uL (ref 4.22–5.81)
RDW: 11.6 % (ref 11.5–15.5)
WBC: 6.9 K/uL (ref 4.0–10.5)
nRBC: 0 % (ref 0.0–0.2)

## 2023-11-12 LAB — BASIC METABOLIC PANEL WITH GFR
Anion gap: 10 (ref 5–15)
BUN: 20 mg/dL (ref 6–20)
CO2: 23 mmol/L (ref 22–32)
Calcium: 8.5 mg/dL — ABNORMAL LOW (ref 8.9–10.3)
Chloride: 104 mmol/L (ref 98–111)
Creatinine, Ser: 1.02 mg/dL (ref 0.61–1.24)
GFR, Estimated: >60 mL/min (ref >60)
Glucose, Bld: 156 mg/dL — ABNORMAL HIGH (ref 70–99)
Potassium: 3.7 mmol/L (ref 3.5–5.1)
Sodium: 137 mmol/L (ref 135–145)

## 2023-11-12 LAB — D-DIMER, QUANTITATIVE: D-Dimer, Quant: <0.27 ug{FEU}/mL (ref 0.00–0.50)

## 2023-11-12 LAB — TROPONIN I (HIGH SENSITIVITY)
Troponin I (High Sensitivity): 3 ng/L (ref <18)
Troponin I (High Sensitivity): 5 ng/L (ref <18)

## 2023-11-12 MED ORDER — MECLIZINE HCL 25 MG PO TABS
25.0000 mg | ORAL_TABLET | Freq: Once | ORAL | Status: AC
Start: 2023-11-12 — End: 2023-11-12
  Administered 2023-11-12: 25 mg via ORAL
  Filled 2023-11-12: qty 1

## 2023-11-12 MED ORDER — MECLIZINE HCL 25 MG PO TABS: 25.0000 mg | ORAL_TABLET | Freq: Three times a day (TID) | ORAL | 0 refills | Status: AC | PRN

## 2023-11-12 NOTE — ED Notes (Signed)
 Patient ambulated hallway independently , no dizziness/steady . EDP notified .

## 2023-11-12 NOTE — ED Provider Notes (Signed)
 Emergency Department Provider Note   I have reviewed the triage vital signs and the nursing notes.   HISTORY  Chief Complaint Dizziness   HPI Isaiah Black is a 48 y.o. male with PMH reviewed presents to the emergency department for evaluation of vertigo symptoms.  Patient noticed feeling like I'm drunk and spinning with standing and movement.  No history of vertigo in the past.  He denies associated numbness or weakness.  No severe headaches.  No symptoms at rest.  Arrives by EMS who report he had initial heart rate in the 130 range.  Patient denies any chest pain or shortness of breath.  No fevers or chills.  Past Medical History:  Diagnosis Date   Anxiety    Asthma    Depression    Disassociation disorder    Paranoid schizophrenia (HCC)     Review of Systems  Constitutional: No fever/chills. Positive vertigo.  Cardiovascular: Denies chest pain. Respiratory: Denies shortness of breath. Gastrointestinal: No abdominal pain. Positive nausea, no vomiting.  No diarrhea.  No constipation. Genitourinary: Negative for dysuria. Musculoskeletal: Negative for back pain. Skin: Negative for rash. Neurological: Negative for headaches, focal weakness or numbness.   ____________________________________________   PHYSICAL EXAM:  VITAL SIGNS: ED Triage Vitals  Encounter Vitals Group     BP 11/11/23 2357 126/81     Pulse Rate 11/11/23 2357 (!) 108     Resp 11/11/23 2357 16     Temp 11/11/23 2357 98.6 F (37 C)     Temp Source 11/11/23 2357 Oral     SpO2 11/11/23 2357 100 %   Constitutional: Alert and oriented. Well appearing and in no acute distress. Eyes: Conjunctivae are normal. PERRL. EOMI. No nystagmus.  Head: Atraumatic. Nose: No congestion/rhinnorhea. Mouth/Throat: Mucous membranes are moist.  Neck: No stridor.   Cardiovascular: Normal rate, regular rhythm. Good peripheral circulation. Grossly normal heart sounds.   Respiratory: Normal respiratory effort.  No  retractions. Lungs CTAB. Gastrointestinal: Soft and nontender. No distention.  Musculoskeletal: No lower extremity tenderness nor edema. No gross deformities of extremities. Neurologic:  Normal speech and language. No gross focal neurologic deficits are appreciated.  5/5 strength of bilateral upper and lower extremities.  Normal finger-nose testing. Skin:  Skin is warm, dry and intact. No rash noted.   ____________________________________________   LABS (all labs ordered are listed, but only abnormal results are displayed)  Labs Reviewed  BASIC METABOLIC PANEL WITH GFR - Abnormal; Notable for the following components:      Result Value   Glucose, Bld 156 (*)    Calcium 8.5 (*)    All other components within normal limits  CBC  D-DIMER, QUANTITATIVE  TROPONIN I (HIGH SENSITIVITY)  TROPONIN I (HIGH SENSITIVITY)   ____________________________________________  EKG   EKG Interpretation Date/Time:  Saturday November 12 2023 00:01:04 EDT Ventricular Rate:  110 PR Interval:  150 QRS Duration:  80 QT Interval:  322 QTC Calculation: 435 R Axis:   -25  Text Interpretation: Sinus tachycardia Otherwise normal ECG When compared with ECG of 26-May-2020 22:36, PREVIOUS ECG IS PRESENT Confirmed by Darra Chew [phone]) on 11/12/2023 2:08:25 AM        ____________________________________________  RADIOLOGY  CT Head Wo Contrast Result Date: 11/12/2023 CLINICAL DATA:  Dizziness EXAM: CT HEAD WITHOUT CONTRAST TECHNIQUE: Contiguous axial images were obtained from the base of the skull through the vertex without intravenous contrast. RADIATION DOSE REDUCTION: This exam was performed according to the departmental dose-optimization program which includes automated exposure control,  adjustment of the mA and/or kV according to patient size and/or use of iterative reconstruction technique. COMPARISON:  11/28/2019 FINDINGS: Brain: No evidence of acute infarction, hemorrhage, hydrocephalus,  extra-axial collection or mass lesion/mass effect. Stable prominent retro cerebellar CSF density is noted. Vascular: No hyperdense vessel or unexpected calcification. Skull: Normal. Negative for fracture or focal lesion. Sinuses/Orbits: Orbits and their contents are within normal limits. The paranasal sinuses again demonstrate mild changes in the ethmoid air cells. Other: None IMPRESSION: No acute intracranial abnormality noted. Electronically Signed   By: Oneil Devonshire M.D.   On: 11/12/2023 00:48   DG Chest 2 View Result Date: 11/12/2023 CLINICAL DATA:  Chest pain and shortness of breath EXAM: CHEST - 2 VIEW COMPARISON:  05/26/2020 FINDINGS: Cardiac shadow is within normal limits. The lungs are clear. Old rib fractures are noted on the right. No focal infiltrate or effusion is seen. IMPRESSION: No active cardiopulmonary disease. Electronically Signed   By: Oneil Devonshire M.D.   On: 11/12/2023 00:47    ____________________________________________   PROCEDURES  Procedure(s) performed:   Procedures  None  ____________________________________________   INITIAL IMPRESSION / ASSESSMENT AND PLAN / ED COURSE  Pertinent labs & imaging results that were available during my care of the patient were reviewed by me and considered in my medical decision making (see chart for details).   This patient is Presenting for Evaluation of vertigo, which does require a range of treatment options, and is a complaint that involves a high risk of morbidity and mortality.  The Differential Diagnoses include BPPV, Mnire's, CVA, etc.  Critical Interventions-    Medications  meclizine (ANTIVERT) tablet 25 mg (25 mg Oral Given 11/12/23 0342)    Reassessment after intervention: symptoms improved.    Clinical Laboratory Tests Ordered, included CBC without leukocytosis.  Troponin normal.  D-dimer normal.  No acute kidney injury.  Radiologic Tests Ordered, included CT head w/o contrast. I independently  interpreted the images and agree with radiology interpretation.   Cardiac Monitor Tracing which shows NSR.    Social Determinants of Health Risk patient denies EtOH in many years.   Medical Decision Making: Summary:  The patient presents emergency department with vertigo symptoms.  No findings on exam to strongly suspect central process.  Plan for meclizine and reassess. Tachycardia from triage has resolved. D dimer and troponin negative.   Reevaluation with update and discussion with patient. After meclizine he is feeling much better. Able to ambulate in the hall without symptoms. Plan for discharge with meclizine Rx and PCP follow up plan.   Patient's presentation is most consistent with acute presentation with potential threat to life or bodily function.   Disposition: discharge  ____________________________________________  FINAL CLINICAL IMPRESSION(S) / ED DIAGNOSES  Final diagnoses:  Vertigo     NEW OUTPATIENT MEDICATIONS STARTED DURING THIS VISIT:  New Prescriptions   MECLIZINE (ANTIVERT) 25 MG TABLET    Take 1 tablet (25 mg total) by mouth 3 (three) times daily as needed for dizziness.    Note:  This document was prepared using Dragon voice recognition software and may include unintentional dictation errors.  Fonda Law, MD, [facility] Emergency Medicine    Diani Jillson, Fonda MATSU, MD 11/12/23 [phone]

## 2023-11-12 NOTE — ED Provider Triage Note (Signed)
 Emergency Medicine Provider Triage Evaluation Note  Isaiah Black , a 48 y.o. male  was evaluated in triage.  Pt complains of dizziness, chest pain and shortness of breath.  Ongoing for 3 days, reports it worsens when he stands for 30 minutes or longer.  Endorsing dizziness.  Endorsing nausea without vomiting.  Denies abdominal pain or diarrhea.  Denies drugs or alcohol.  Nursing staff made aware patient requires room.  Review of Systems  Positive:  Negative:   Physical Exam  BP 126/81 (BP Location: Right Arm)   Pulse (!) 108   Temp 98.6 F (37 C) (Oral)   Resp 16   SpO2 100%  Gen:   Awake, no distress   Resp:  Normal effort  MSK:   Moves extremities without difficulty  Other:    Medical Decision Making  Medically screening exam initiated at 12:19 AM.  Appropriate orders placed.  Isaiah Black was informed that the remainder of the evaluation will be completed by another provider, this initial triage assessment does not replace that evaluation, and the importance of remaining in the ED until their evaluation is complete.     Ruthell Lonni FALCON, PA-C 11/12/23 0020

## 2023-11-12 NOTE — Discharge Instructions (Signed)
We believe your symptoms were caused by benign vertigo.  Please read through the included information and take any prescribed medication(s).  Follow up with your doctor as listed above.  If you develop any new or worsening symptoms that concern you, including but not limited to persistent dizziness/vertigo, numbness or weakness in your arms or legs, altered mental status, persistent vomiting, or fever greater than 101, please return immediately to the Emergency Department.  

## 2023-11-15 NOTE — Progress Notes (Signed)
 Cardiology Office Note:   Date:  11/15/2023  ID:  Selinda Du, DOB [birth year], MRN [id number] PCP: Celestia Rosaline SQUIBB, NP  [facility] Providers Cardiologist:  None { Chief Complaint: No chief complaint on file.     History of Present Illness:   Isaiah Black is a 48 y.o. male with a PMH of prior cardiac arrest vs syncope and schizophrenia who presents as a new patient referral by Zane Bach for the evaluation of QT prolongation.   Of note the patient suffered an event in 2021 that is described as being either cardiac arrest versus syncopal event.  This event occurred after the patient injected Invega  for schizophrenia.  CPR was performed, but no shockable rhythm was identified.  No QT prolongation on ECG at that time.  Echocardiogram was unremarkable.  This event was not felt to be cardiac in etiology and the patient has not seen a cardiologist.   Past Medical History:  Diagnosis Date   Anxiety    Asthma    Depression    Disassociation disorder    Paranoid schizophrenia (HCC)      Studies Reviewed:    EKG: ***       Cardiac Studies & Procedures   ______________________________________________________________________________________________     ECHOCARDIOGRAM  ECHOCARDIOGRAM COMPLETE 11/29/2019  Narrative ECHOCARDIOGRAM REPORT    Patient Name:   Isaiah Black Date of Exam: 11/29/2019 Medical Rec #:  [id number]     Height:       66.0 in Accession #:    [id number]    Weight:       127.4 lb Date of Birth:  1976-01-30      BSA:          1.651 m Patient Age:    44 years      BP:           118/78 mmHg Patient Gender: M             HR:           115 bpm. Exam Location:  Inpatient  Procedure: 2D Echo, Color Doppler and Cardiac Doppler  Indications:    Cardiac Arrest i46.9  History:        Patient has no prior history of Echocardiogram examinations.  Sonographer:    Damien Senior RDCS Referring Phys: [id number] DAN FLOYD  IMPRESSIONS   1. Left  ventricular ejection fraction, by estimation, is 60 to 65%. The left ventricle has normal function. The left ventricle has no regional wall motion abnormalities. Left ventricular diastolic parameters are consistent with Grade I diastolic dysfunction (impaired relaxation). 2. Right ventricular systolic function is normal. The right ventricular size is normal. Tricuspid regurgitation signal is inadequate for assessing PA pressure. 3. The mitral valve is normal in structure. No evidence of mitral valve regurgitation. No evidence of mitral stenosis. 4. The aortic valve is tricuspid. Aortic valve regurgitation is not visualized. No aortic stenosis is present. 5. Aortic dilatation noted. There is mild dilatation of the aortic root, measuring 37 mm. 6. The inferior vena cava is normal in size with greater than 50% respiratory variability, suggesting right atrial pressure of 3 mmHg.  FINDINGS Left Ventricle: Left ventricular ejection fraction, by estimation, is 60 to 65%. The left ventricle has normal function. The left ventricle has no regional wall motion abnormalities. The left ventricular internal cavity size was normal in size. There is no left ventricular hypertrophy. Left ventricular diastolic parameters are consistent with Grade I diastolic dysfunction (impaired relaxation).  Right Ventricle:  The right ventricular size is normal. No increase in right ventricular wall thickness. Right ventricular systolic function is normal. Tricuspid regurgitation signal is inadequate for assessing PA pressure.  Left Atrium: Left atrial size was normal in size.  Right Atrium: Right atrial size was normal in size.  Pericardium: There is no evidence of pericardial effusion.  Mitral Valve: The mitral valve is normal in structure. No evidence of mitral valve regurgitation. No evidence of mitral valve stenosis.  Tricuspid Valve: The tricuspid valve is normal in structure. Tricuspid valve regurgitation is not  demonstrated.  Aortic Valve: The aortic valve is tricuspid. Aortic valve regurgitation is not visualized. No aortic stenosis is present.  Pulmonic Valve: The pulmonic valve was normal in structure. Pulmonic valve regurgitation is not visualized.  Aorta: Aortic dilatation noted. There is mild dilatation of the aortic root, measuring 37 mm.  Venous: The inferior vena cava is normal in size with greater than 50% respiratory variability, suggesting right atrial pressure of 3 mmHg.  IAS/Shunts: No atrial level shunt detected by color flow Doppler.   LEFT VENTRICLE PLAX 2D LVIDd:         4.70 cm  Diastology LVIDs:         2.90 cm  LV e' medial:    8.58 cm/s LV PW:         0.70 cm  LV E/e' medial:  7.5 LV IVS:        0.80 cm  LV e' lateral:   12.10 cm/s LVOT diam:     2.00 cm  LV E/e' lateral: 5.3 LV SV:         68 LV SV Index:   41 LVOT Area:     3.14 cm   RIGHT VENTRICLE RV S prime:     15.90 cm/s TAPSE (M-mode): 2.2 cm  LEFT ATRIUM             Index       RIGHT ATRIUM           Index LA diam:        2.70 cm 1.64 cm/m  RA Area:     12.70 cm LA Vol (A2C):   25.0 ml 15.14 ml/m RA Volume:   28.70 ml  17.38 ml/m LA Vol (A4C):   18.6 ml 11.27 ml/m LA Biplane Vol: 22.3 ml 13.51 ml/m AORTIC VALVE LVOT Vmax:   140.00 cm/s LVOT Vmean:  88.600 cm/s LVOT VTI:    0.215 m  AORTA Ao Root diam: 3.70 cm Ao Asc diam:  3.20 cm  MITRAL VALVE MV Area (PHT): 2.91 cm     SHUNTS MV Decel Time: 261 msec     Systemic VTI:  0.22 m MV E velocity: 64.00 cm/s   Systemic Diam: 2.00 cm MV A velocity: 115.00 cm/s MV E/A ratio:  0.56  Ezra Shuck MD Electronically signed by Ezra Shuck MD Signature Date/Time: 11/29/2019/4:56:45 PM    Final          ______________________________________________________________________________________________      Risk Assessment/Calculations:   {Does this patient have ATRIAL FIBRILLATION?[phone]} No BP recorded.  {Refresh Note OR Click  here to enter BP  :1}***        Physical Exam:     VS:  There were no vitals taken for this visit. ***    Wt Readings from Last 3 Encounters:  02/10/22 161 lb 12.8 oz (73.4 kg)  01/21/22 158 lb 6.4 oz (71.8 kg)  01/13/22 161 lb 12.8 oz (73.4 kg)  GEN: Well nourished, well developed, in no acute distress NECK: No JVD; No carotid bruits CARDIAC: ***RRR, no murmurs, rubs, gallops RESPIRATORY:  Clear to auscultation without rales, wheezing or rhonchi  ABDOMEN: Soft, non-tender, non-distended, normal bowel sounds EXTREMITIES:  Warm and well perfused, no edema; No deformity, 2+ radial pulses PSYCH: Normal mood and affect   Assessment & Plan   - I have reviewed all the patient's ECGs and available within the chart and see no evidence of QTc prolongation or changes concerning for significant structural heart disease.  His ECGs demonstrate sinus tachycardia and there was 1 ECG that showed frequent PVCs.  ECG today is unremarkable.    {Are you ordering a CV Procedure (e.g. stress test, cath, DCCV, TEE, etc)?   Press F2        :[id number]}   This note was written with the assistance of a dictation microphone or AI dictation software. Please excuse any typos or grammatical errors.   Signed, Georganna Archer, MD 11/15/2023 1:50 PM    [facility]

## 2023-11-16 ENCOUNTER — Ambulatory Visit: Attending: Cardiovascular Disease | Admitting: Student in an Organized Health Care Education/Training Program

## 2023-11-16 ENCOUNTER — Encounter: Payer: Self-pay | Admitting: Student in an Organized Health Care Education/Training Program

## 2023-11-16 VITALS — BP 114/82 | HR 94 | Ht 66.0 in | Wt 163.4 lb

## 2023-11-16 DIAGNOSIS — R079 Chest pain, unspecified: Secondary | ICD-10-CM | POA: Diagnosis present

## 2023-11-16 DIAGNOSIS — R9431 Abnormal electrocardiogram [ECG] [EKG]: Secondary | ICD-10-CM | POA: Diagnosis present

## 2023-11-16 DIAGNOSIS — R42 Dizziness and giddiness: Secondary | ICD-10-CM | POA: Insufficient documentation

## 2023-11-16 NOTE — Patient Instructions (Signed)
 Medication Instructions:  No medication changes were made at this visit. Continue current regimen.   *If you need a refill on your cardiac medications before your next appointment, please call your pharmacy*  Lab Work: None ordered today. If you have labs (blood work) drawn today and your tests are completely normal, you will receive your results only by: MyChart Message (if you have MyChart) OR A paper copy in the mail If you have any lab test that is abnormal or we need to change your treatment, we will call you to review the results.  Testing/Procedures: Your physician has requested that you have an echocardiogram. Echocardiography is a painless test that uses sound waves to create images of your heart. It provides your doctor with information about the size and shape of your heart and how well your heart's chambers and valves are working. This procedure takes approximately one hour. There are no restrictions for this procedure. Please do NOT wear cologne, perfume, aftershave, or lotions (deodorant is allowed). Please arrive 15 minutes prior to your appointment time.  Please note: We ask at that you not bring children with you during ultrasound (echo/ vascular) testing. Due to room size and safety concerns, children are not allowed in the ultrasound rooms during exams. Our front office staff cannot provide observation of children in our lobby area while testing is being conducted. An adult accompanying a patient to their appointment will only be allowed in the ultrasound room at the discretion of the ultrasound technician under special circumstances. We apologize for any inconvenience.   Follow-Up: At [facility], you and your health needs are our priority.  As part of our continuing mission to provide you with exceptional heart care, our providers are all part of one team.  This team includes your primary Cardiologist (physician) and Advanced Practice Providers or APPs (Physician  Assistants and Nurse Practitioners) who all work together to provide you with the care you need, when you need it.  Your next appointment:   As needed

## 2023-11-24 ENCOUNTER — Encounter (HOSPITAL_COMMUNITY): Payer: Self-pay

## 2023-11-24 ENCOUNTER — Ambulatory Visit (INDEPENDENT_AMBULATORY_CARE_PROVIDER_SITE_OTHER)

## 2023-11-24 ENCOUNTER — Ambulatory Visit (INDEPENDENT_AMBULATORY_CARE_PROVIDER_SITE_OTHER): Admitting: Psychiatry

## 2023-11-24 VITALS — BP 116/93 | HR 101 | Resp 16 | Ht 66.0 in | Wt 158.6 lb

## 2023-11-24 DIAGNOSIS — F251 Schizoaffective disorder, depressive type: Secondary | ICD-10-CM | POA: Diagnosis not present

## 2023-11-24 DIAGNOSIS — F411 Generalized anxiety disorder: Secondary | ICD-10-CM | POA: Diagnosis not present

## 2023-11-24 DIAGNOSIS — F32A Depression, unspecified: Secondary | ICD-10-CM

## 2023-11-24 DIAGNOSIS — F200 Paranoid schizophrenia: Secondary | ICD-10-CM

## 2023-11-24 MED ORDER — BUSPIRONE HCL 15 MG PO TABS: 30.0000 mg | ORAL_TABLET | Freq: Two times a day (BID) | ORAL | Status: DC

## 2023-11-24 MED ORDER — ABILIFY MAINTENA 400 MG IM PRSY: PREFILLED_SYRINGE | INTRAMUSCULAR | 11 refills | Status: DC

## 2023-11-24 MED ORDER — HYDROXYZINE HCL 50 MG PO TABS: ORAL_TABLET | ORAL | Status: DC

## 2023-11-24 NOTE — Progress Notes (Cosign Needed)
 Patient presented to office for injection of Abilify  Maintena 400 mg. Patient received injection in right deltoid. Patient tolerated injection well. Patient was seen by provider prior to injection. Patient denies any concerns. Patient will return in 28 days.

## 2023-11-24 NOTE — Progress Notes (Signed)
 BH MD/PA/NP OP Progress Note  11/24/2023 10:19 AM Isaiah Black  MRN:  [id number]  Chief Complaint: [facility] has not been able to fill my medication  HPI: 63 old male seen today for follow-up psychiatric evaluation.  He has a psychiatric history of paranoid schizophrenia, anxiety, and disassociation disorder.  He is currently being managed on Buspar  30 mg twice daily, Mirtazapine  7.5 mg nightly, hydroxyzine  50 mg three times daily as needed, and Abilify  400 mg monthly injection. Patient reported that he has not been taking Mirtazapine . Today he reports that his medications are effective in managing his psychiatric condition.   Today he is well groomed, pleasant, cooperative, engaged in conversation, maintained eye contact.  He informed provider that [employer] computer system has been down and reports that they were not able to fill his Abilify  Maintena. Provider informed patient that he could receive a sample. Patient was was grateful.  Today he notes that his depression and anxiety has been well-managed.  Today provider contact a GAD-7 and patient scored a 10.  Conducted PHQ-9 patient scored a 7.  He endorses adequate sleep and appetite.  Today he denies SI/HI.  Patient continues to have auditory . He notes that he continues to hear chattering. Patient reports that he is able to cope with this.  Patient reports that he has been having syncope episodes. Patient was diagnosed with vertigo and given Meclizine. He reports that he finds it effective.  Patient had a EKG done on 11/16/2023.  Per chart review patient had sinus tachycardia but otherwise normal ECG.  Today provider conducted an [place] assessment and patient scored a 0.   At this time mirtazapine  not restarted.  Patient notes that he is not in need of refills of hydroxyzine  and BuSpar .  He will continue Abilify  as prescribed. No other concerns at this time. Visit Diagnosis:    ICD-10-CM   1. Paranoid schizophrenia (HCC)  F20.0  ARIPiprazole  ER (ABILIFY  MAINTENA) 400 MG PRSY prefilled syringe    2. Generalized anxiety disorder  F41.1 busPIRone  (BUSPAR ) 15 MG tablet    hydrOXYzine  (ATARAX ) 50 MG tablet    3. Mild depression  F32.A busPIRone  (BUSPAR ) 15 MG tablet         Past Psychiatric History: paranoid schizophrenia, anxiety, and disassociation disorder.   Past Medical History:  Past Medical History:  Diagnosis Date   Anxiety    Asthma    Depression    Disassociation disorder    Paranoid schizophrenia (HCC)    No past surgical history on file.  Family Psychiatric History: Paternal uncle schizophrenia, Father Bipolar 1, Maternal aunt Bipolar 1, Niece depression  Family History:  Family History  Problem Relation Age of Onset   Colon cancer Mother        mid 23s   Diabetes Mother    Diabetes Maternal Aunt    Diabetes Maternal Uncle    Aneurysm Maternal Uncle    Heart attack Maternal Grandfather    Stomach cancer Neg Hx    Esophageal cancer Neg Hx     Social History:  Social History   Socioeconomic History   Marital status: Single    Spouse name: Not on file   Number of children: 1   Years of education: Not on file   Highest education level: Not on file  Occupational History   Not on file  Tobacco Use   Smoking status: Former   Smokeless tobacco: Never  Vaping Use   Vaping status: Former  Substance and  Sexual Activity   Alcohol use: Not Currently    Comment: occ   Drug use: Not Currently   Sexual activity: Not Currently  Other Topics Concern   Not on file  Social History Narrative   Not on file   Social Drivers of Health   Financial Resource Strain: Low Risk  (08/06/2020)   Overall Financial Resource Strain (CARDIA)    Difficulty of Paying Living Expenses: Not hard at all  Food Insecurity: No Food Insecurity (04/14/2021)   Hunger Vital Sign    Worried About Running Out of Food in the Last Year: Never true    Ran Out of Food in the Last Year: Never true  Transportation  Needs: No Transportation Needs (04/14/2021)   PRAPARE - Administrator, Civil Service (Medical): No    Lack of Transportation (Non-Medical): No  Physical Activity: Sufficiently Active (09/03/2020)   Exercise Vital Sign    Days of Exercise per Week: 7 days    Minutes of Exercise per Session: 60 min  Recent Concern: Physical Activity - Insufficiently Active (08/06/2020)   Exercise Vital Sign    Days of Exercise per Week: 2 days    Minutes of Exercise per Session: 40 min  Stress: No Stress Concern Present (08/06/2020)   [employer] of Occupational Health - Occupational Stress Questionnaire    Feeling of Stress : Only a little  Social Connections: Moderately Isolated (08/12/2023)   Social Connection and Isolation Panel    Frequency of Communication with Friends and Family: More than three times a week    Frequency of Social Gatherings with Friends and Family: Twice a week    Attends Religious Services: Never    Database administrator or Organizations: Yes    Attends Engineer, structural: More than 4 times per year    Marital Status: Never married    Allergies:  Allergies  Allergen Reactions   Cogentin  [Benztropine ]     Metabolic Disorder Labs: Lab Results  Component Value Date   HGBA1C 5.5 01/21/2022   MPG 85.32 03/07/2019   MPG 99.67 07/08/2017   Lab Results  Component Value Date   PROLACTIN 8.6 06/17/2021   PROLACTIN 100.0 (H) 03/07/2019   Lab Results  Component Value Date   CHOL 183 06/17/2021   TRIG 152 (H) 06/17/2021   HDL 49 06/17/2021   CHOLHDL 3.7 06/17/2021   VLDL 10 03/07/2019   LDLCALC 107 (H) 06/17/2021   LDLCALC 80 03/07/2019   Lab Results  Component Value Date   TSH 2.160 01/21/2022   TSH 6.510 (H) 06/17/2021    Therapeutic Level Labs: No results found for: LITHIUM No results found for: VALPROATE No results found for: CBMZ  Current Medications: Current Outpatient Medications  Medication Sig Dispense Refill    ARIPiprazole  ER (ABILIFY  MAINTENA) 400 MG PRSY prefilled syringe INJECT EVERY 28 TO 30 DAYS 1 each 11   ARIPiprazole  ER (ABILIFY  MAINTENA) 400 MG SRER injection INJECT EVERY 28 TO 30 DAYS 1 each 11   betamethasone  valerate ointment (VALISONE ) 0.1 % Apply 1 Application topically 2 (two) times daily. 45 g 3   budesonide -formoterol  (SYMBICORT ) 160-4.5 MCG/ACT inhaler Inhale 2 puffs into the lungs 2 (two) times daily. 3 each 4   busPIRone  (BUSPAR ) 15 MG tablet Take 2 tablets (30 mg total) by mouth 2 (two) times daily.     hydrOXYzine  (ATARAX ) 50 MG tablet TAKE 1 TABLET(50 MG) BY MOUTH THREE TIMES DAILY AS NEEDED     meclizine (ANTIVERT)  25 MG tablet Take 1 tablet (25 mg total) by mouth 3 (three) times daily as needed for dizziness. 30 tablet 0   montelukast  (SINGULAIR ) 10 MG tablet TAKE 1 TABLET (10 MG TOTAL) BY MOUTH AT BEDTIME. 30 tablet 3   VENTOLIN  HFA 108 (90 Base) MCG/ACT inhaler INHALE 2 PUFFS MOUTH EVERY 6 (SIX) HOURS AS NEEDED FOR WHEEZING OR SHORTNESS OF BREATH. 18 g 0   Current Facility-Administered Medications  Medication Dose Route Frequency Provider Last Rate Last Admin   0.9 %  sodium chloride  infusion  500 mL Intravenous Once Armbruster, Elspeth SQUIBB, MD       0.9 %  sodium chloride  infusion  500 mL Intravenous Once Armbruster, Elspeth SQUIBB, MD       ARIPiprazole  ER (ABILIFY  MAINTENA) 400 MG prefilled syringe 400 mg  400 mg Intramuscular Q28 days Dasie Ellouise CROME, FNP   400 mg at 11/24/23 [id number]     Musculoskeletal: Strength & Muscle Tone: within normal limits Gait & Station: normal Patient leans: N/A  Psychiatric Specialty Exam: Review of Systems  Blood pressure (!) 116/93, pulse (!) 101, temperature 98 F (36.7 C), resp. rate 16, weight 158 lb 9.6 oz (71.9 kg), SpO2 97%.Body mass index is 25.6 kg/m.  General Appearance: Well Groomed  Eye Contact:  Good  Speech:  Clear and Coherent and Normal Rate  Volume:  Normal  Mood:  Euthymic  Affect:  Appropriate and Congruent  Thought  Process:  Coherent, Goal Directed, and Linear  Orientation:  Full (Time, Place, and Person)  Thought Content: Logical and Hallucinations: Auditory   Suicidal Thoughts:  No  Homicidal Thoughts:  No  Memory:  Immediate;   Good Recent;   Good Remote;   Good  Judgement:  Good  Insight:  Good  Psychomotor Activity:  Normal  Concentration:  Concentration: Good and Attention Span: Good  Recall:  Good  Fund of Knowledge: Good  Language: Good  Akathisia:  No  Handed:  Right  AIMS (if indicated): done, 0  Assets:  Communication Skills Desire for Improvement Financial Resources/Insurance Housing Leisure Time Physical Health Social Support  ADL's:  Intact  Cognition: WNL  Sleep:  Good   Screenings: AIMS    Flowsheet Row Office Visit from 05/31/2023 in [facility] Office Visit from 03/08/2023 in [facility] Office Visit from 03/18/2022 in [facility] Clinical Support from 01/20/2022 in [facility] Office Visit from 11/26/2021 in [facility]  AIMS Total Score 0 0 0 0 0   AUDIT    Flowsheet Row Ad[facility] (Discharged) [facility]  Alcohol Use Disorder Identification Test Final Score (AUDIT) 25 2   CAGE-AID    Flowsheet Row Counselor from 07/03/2019 in [facility]  CAGE-AID Score 0   GAD-7    Flowsheet Row Office Visit from 11/24/2023 in [facility] Counselor from 09/13/2023 in [facility] Counselor from 08/12/2023 in [facility] Office Visit from 05/31/2023 in [facility] Counselor from 03/16/2023 in [facility]  Total GAD-7 Score 10 5 5 14 6    PHQ2-9     Flowsheet Row Office Visit from 11/24/2023 in [facility] Counselor from 09/13/2023 in [facility] Counselor from 08/12/2023 in [facility] Office Visit from 05/31/2023 in [facility] Counselor from 03/16/2023 in [facility]  PHQ-2 Total Score 1 0 1 1 0  PHQ-9 Total Score 7 5 4 9 3    Flowsheet Row Office Visit from 05/31/2023 in [facility] Counselor from 04/27/2023 in [facility] Office Visit from 03/08/2023 in [facility]  C-SSRS RISK CATEGORY No Risk No Risk No Risk     Assessment and Plan: Patient endorses auditory hallucination but notes that he is able to cope with it. He reports that his anxiety, sleep, and depression are well managed. At this time mirtazapine  not restarted.  Patient notes that he is not in need of refills of hydroxyzine  and BuSpar .  He will continue Abilify  as prescribed.   1. Paranoid schizophrenia (HCC)  Continue- ARIPiprazole  ER (ABILIFY  MAINTENA) 400 MG PRSY prefilled syringe; INJECT EVERY 28 TO 30 DAYS  Dispense: 1 each; Refill: 11  2. Generalized anxiety disorder  Continue- busPIRone  (BUSPAR ) 15 MG tablet; Take 2 tablets (30 mg total) by mouth 2 (two) times daily. Continue- hydrOXYzine  (ATARAX ) 50 MG tablet; TAKE 1 TABLET(50 MG) BY MOUTH THREE TIMES DAILY AS NEEDED  3. Mild depression  Continue- busPIRone  (BUSPAR ) 15 MG tablet; Take 2 tablets (30 mg total) by mouth 2 (two) times daily.   Follow-up in 3 months Zane FORBES Bach, NP 11/24/2023, 10:19 AM

## 2023-12-06 ENCOUNTER — Ambulatory Visit (INDEPENDENT_AMBULATORY_CARE_PROVIDER_SITE_OTHER): Admitting: Licensed Clinical Social Worker

## 2023-12-06 DIAGNOSIS — F200 Paranoid schizophrenia: Secondary | ICD-10-CM | POA: Diagnosis not present

## 2023-12-06 NOTE — Progress Notes (Addendum)
 THERAPIST PROGRESS NOTE  Session Time: 20   Participation Level: Active  Behavioral Response: CasualAlertDepressed  Type of Therapy: Individual Therapy  Treatment Goals addressed:   Active     Anxiety Disorder CCP Problem  1 GAD      walk 3 x weekly  (Completed/Met)     Start:  03/24/21    Expected End:  04/01/22    Resolved:  01/12/22      Find independent housing  (Not Applicable)     Start:  03/24/21    Expected End:  10/01/22    Resolved:  07/28/22      Decrease nightmare to 2/7 days weekly  (Completed/Met)     Start:  03/24/21    Expected End:  04/01/22    Resolved:  03/31/22    Goal Note     Pt reports no nightmares that he can rememeber         LTG: Patient will score less than 5 on the Generalized Anxiety Disorder 7 Scale (GAD-7) (Completed/Met)     Start:  03/24/21    Expected End:  06/03/23    Resolved:  03/16/23      STG: Patient will complete at least 80% of assigned homework (Completed/Met)     Start:  03/24/21    Expected End:  10/01/22    Resolved:  07/28/22      STG: Patient will practice problem solving skills 3 times per week for the next 4 weeks (Progressing)     Start:  03/24/21    Expected End:  03/02/24         STG: Report a decrease in anxiety symptoms as evidenced by an overall reduction in anxiety score by a minimum of 25% on the Generalized Anxiety Disorder Scale (Completed/Met)     Start:  03/24/21    Expected End:  04/01/22    Resolved:  12/22/21      Discuss risks and benefits of medication treatment options for this problem and prescribe as indicated (Completed)     Start:  03/24/21    End:  06/02/21      Encourage patient to take psychotropic medication as prescribed (Completed)     Start:  03/24/21    End:  06/02/21      Review results of GAD-7 with the patient to track progress (Completed)     Start:  03/24/21    End:  06/02/21      Work with patient to track symptoms, triggers and/or skill use through a mood  chart, diary card, or journal (Completed)     Start:  03/24/21    End:  06/02/21      Perform psychoeducation regarding anxiety disorders (Completed)     Start:  03/24/21    End:  06/02/21      Provide patient with educational information and reading material on anxiety, its causes, and symptoms (Completed)     Start:  03/24/21    End:  06/24/21      Work with patient to identify the major components of a recent episode of anxiety: physical symptoms, major thoughts and images, and major behaviors they experienced (Completed)     Start:  03/24/21    End:  06/02/21      Work with patient to identify 3 personal goals for managing their anxiety to work on during current treatment (Completed)     Start:  03/24/21    End:  06/02/21       ProgressTowards Goals: Progressing  Interventions: CBT and Supportive  Summary: Jadian Karman is a 48 y.o. male  Objective:   Alert and oriented x5.  Pleasant, cooperative, maintained good eye contact.  Casually dressed, engaged appropriately.  Mood and affect appear stable and congruent.  Insight and judgment assessed as good.  S: Subjective Mehar was alert and oriented x5, pleasant, cooperative, and maintained good eye contact. He engaged well during the therapy session and was casually dressed.  Patient reported that "everything has been going well." He stated that his mental health has remained stable as long as he continues taking his medications consistently. Shaune described utilizing positive coping strategies, including 3D printing, spending time with family, and attending [employer].  LCSW and patient discussed LCSW's upcoming transfer to the [place] office. Ausencio reported feeling stable enough to discharge from therapy at this time and plans to engage more frequently with [facility] as part of his ongoing support system.  A: Assessment Patient presents with improved mood stability and effective coping skills.  Reports consistent medication adherence and increased engagement in meaningful activities. No current symptoms of acute distress noted. Patient appears stable and appropriate for discharge planning, with a solid support system and understanding of follow-up resources if symptoms re-emerge.  P: Plan / Intervention  LCSW provided psychodynamic therapy to facilitate expression of thoughts, feelings, and emotions in a supportive, nonjudgmental environment.  Discussed LCSW's transfer to the [place] office and provided information regarding continuity of care options.  Planned one final session during LCSW's last week at [facility] for discharge planning and review of reassessment resources if needed.  Patient to continue medication management at [facility].  Encouraged continued use of coping skills ([employer], family time, [facility] involvement) to maintain stability.  Suicidal/Homicidal: Nowithout intent/plan  Plan: Return again in 6 weeks.  Diagnosis: Paranoid schizophrenia (HCC)  Collaboration of Care: Follow-up for medication management at [facility].  Patient/Guardian was advised Release of Information must be obtained prior to any record release in order to collaborate their care with an outside provider. Patient/Guardian was advised if they have not already done so to contact the registration department to sign all necessary forms in order for us  to release information regarding their care.   Consent: Patient/Guardian gives verbal consent for treatment and assignment of benefits for services provided during this visit. Patient/Guardian expressed understanding and agreed to proceed.   Juliene GORMAN Patee, LCSW 12/06/2023

## 2023-12-22 ENCOUNTER — Ambulatory Visit (INDEPENDENT_AMBULATORY_CARE_PROVIDER_SITE_OTHER)

## 2023-12-22 VITALS — BP 122/81 | HR 80 | Ht 66.0 in | Wt 161.0 lb

## 2023-12-22 DIAGNOSIS — F251 Schizoaffective disorder, depressive type: Secondary | ICD-10-CM

## 2023-12-22 NOTE — Progress Notes (Cosign Needed)
 Pt presents today for injection of Abilify  Maintenna 400mg  which was administered in LEFT deltoid with no complaints.     JNL, CMA   Pt presents today well groomed and very conversational. States that he is doing good over all, pre in regards to mental. Pt denies any AVH, SI, and HI. PT states he has no concerns to be noted to provider for today. PT WILL BE RETURNING IN 28 DAYS.

## 2023-12-28 ENCOUNTER — Encounter (HOSPITAL_COMMUNITY): Payer: Self-pay

## 2023-12-28 ENCOUNTER — Ambulatory Visit (HOSPITAL_COMMUNITY)
Admission: RE | Admit: 2023-12-28 | Source: Ambulatory Visit | Attending: Student in an Organized Health Care Education/Training Program | Admitting: Student in an Organized Health Care Education/Training Program

## 2024-01-11 ENCOUNTER — Encounter (HOSPITAL_COMMUNITY): Payer: Self-pay | Admitting: Licensed Clinical Social Worker

## 2024-01-11 ENCOUNTER — Ambulatory Visit (INDEPENDENT_AMBULATORY_CARE_PROVIDER_SITE_OTHER): Admitting: Licensed Clinical Social Worker

## 2024-01-11 DIAGNOSIS — F333 Major depressive disorder, recurrent, severe with psychotic symptoms: Secondary | ICD-10-CM

## 2024-01-11 DIAGNOSIS — F200 Paranoid schizophrenia: Secondary | ICD-10-CM

## 2024-01-11 NOTE — Progress Notes (Signed)
 THERAPIST PROGRESS NOTE  Session Time: 24  Participation Level: Active  Behavioral Response: CasualAlertAnxious and Depressed  Type of Therapy: Individual Therapy  Treatment Goals addressed:  Resolved     Anxiety Disorder CCP Problem  1 GAD      walk 3 x weekly  (Completed/Met)     Start:  03/24/21    Expected End:  04/01/22    Resolved:  01/12/22      Find independent housing  (Not Applicable)     Start:  03/24/21    Expected End:  10/01/22    Resolved:  07/28/22      Decrease nightmare to 2/7 days weekly  (Completed/Met)     Start:  03/24/21    Expected End:  04/01/22    Resolved:  03/31/22    Goal Note     Pt reports no nightmares that Isaiah Black can rememeber         LTG: Patient will score less than 5 on the Generalized Anxiety Disorder 7 Scale (GAD-7) (Completed/Met)     Start:  03/24/21    Expected End:  06/03/23    Resolved:  03/16/23      STG: Patient will complete at least 80% of assigned homework (Completed/Met)     Start:  03/24/21    Expected End:  10/01/22    Resolved:  07/28/22      STG: Patient will practice problem solving skills 3 times per week for the next 4 weeks (Completed/Met)     Start:  03/24/21    Expected End:  03/02/24    Resolved:  01/11/24      STG: Report a decrease in anxiety symptoms as evidenced by an overall reduction in anxiety score by a minimum of 25% on the Generalized Anxiety Disorder Scale (Completed/Met)     Start:  03/24/21    Expected End:  04/01/22    Resolved:  12/22/21      Discuss risks and benefits of medication treatment options for this problem and prescribe as indicated (Completed)     Start:  03/24/21    End:  06/02/21      Encourage patient to take psychotropic medication as prescribed (Completed)     Start:  03/24/21    End:  06/02/21      Review results of GAD-7 with the patient to track progress (Completed)     Start:  03/24/21    End:  06/02/21      Work with patient to track symptoms,  triggers and/or skill use through a mood chart, diary card, or journal (Completed)     Start:  03/24/21    End:  06/02/21      Perform psychoeducation regarding anxiety disorders (Completed)     Start:  03/24/21    End:  06/02/21      Provide patient with educational information and reading material on anxiety, its causes, and symptoms (Completed)     Start:  03/24/21    End:  06/24/21      Work with patient to identify the major components of a recent episode of anxiety: physical symptoms, major thoughts and images, and major behaviors they experienced (Completed)     Start:  03/24/21    End:  06/02/21      Work with patient to identify 3 personal goals for managing their anxiety to work on during current treatment (Completed)     Start:  03/24/21    End:  06/02/21  ProgressTowards Goals: Progressing  Interventions: CBT and Motivational Interviewing   Suicidal/Homicidal: Nowithout intent/plan  Therapist Response:    S: Subjective Isaiah Black participated in todays session via in-person appointment and was agreeable to reviewing discharge instructions. Isaiah Black reports a decrease in auditory and visual hallucinations since beginning therapy. Isaiah Black states Isaiah Black is sleeping 6-8 hours nightly, with occasional nightmares Isaiah Black cannot recall but reports these do not negatively affect his mental health.  Isaiah Black shares positive news, stating Isaiah Black is done with parole as of 2026, evidenced by having his ankle monitor removed last Friday. Isaiah Black reports engaging in [employer] as a hobby and identifies strong family support from his mother, sister, and daughters mother. Isaiah Black also plans to continue receiving community support services through [facility].  Isaiah Black denies any acute concerns at this time and was receptive to all information provided during the session.  O: Objective Isaiah Black was alert and oriented x5. Isaiah Black appeared pleasant, cooperative, maintained good eye contact, and was casually dressed.  Isaiah Black engaged well in the therapeutic process. Mood was euthymic with appropriate affect. No behavioral disturbances noted.  GAD-7 and PHQ-9 administered; patient scored 5 or below on both measures.  A: Assessment Isaiah Black demonstrates improved symptom management with significant reduction in hallucinations. Sleep is adequate despite occasional non-distressing nightmares. Isaiah Black shows insight into his mental health, is medication-compliant, and has strong familial and community supports. Symptoms appear mild per screening tools (GAD-7 and PHQ-9). Patient is appropriate for discharge with follow-up resources in place.  P: Plan / Interventions  LCSW provided discharge instructions, including clinic contact information for [facility].  Clarified that due to [facility], Isaiah Black must continue care at [facility] if Isaiah Black wishes to be seen by [facility] through [facility].  Reinforced the importance of medication adherence, including attending the injection clinic for monthly injections.  Utilized supportive therapy for praise and encouragement.  Utilized psychodynamic therapy to facilitate emotional expression in a nonjudgmental environment.  Utilized motivational interviewing, including open-ended questions and reflective listening.  Patient verbalized understanding of all instructions and demonstrated readiness for discharge with ongoing community support. Plan: Return again in 4 weeks.  Diagnosis: Paranoid schizophrenia (Isaiah Black)  Severe episode of recurrent major depressive disorder, with psychotic features (Isaiah Black)  Collaboration of Care: Other None today   Patient/Guardian was advised Release of Information must be obtained prior to any record release in order to collaborate their care with an outside provider. Patient/Guardian was advised if they have not already done so to contact the registration department to sign all necessary forms in order for us  to release information  regarding their care.   Consent: Patient/Guardian gives verbal consent for treatment and assignment of benefits for services provided during this visit. Patient/Guardian expressed understanding and agreed to proceed.   Juliene GORMAN Patee, LCSW 01/11/2024

## 2024-01-17 ENCOUNTER — Encounter: Payer: Self-pay | Admitting: Family Medicine

## 2024-01-17 ENCOUNTER — Ambulatory Visit: Admitting: Family Medicine

## 2024-01-17 VITALS — BP 132/90 | HR 75 | Ht 65.0 in | Wt 162.8 lb

## 2024-01-17 DIAGNOSIS — Z760 Encounter for issue of repeat prescription: Secondary | ICD-10-CM | POA: Diagnosis not present

## 2024-01-17 DIAGNOSIS — J4540 Moderate persistent asthma, uncomplicated: Secondary | ICD-10-CM

## 2024-01-17 DIAGNOSIS — R21 Rash and other nonspecific skin eruption: Secondary | ICD-10-CM | POA: Diagnosis not present

## 2024-01-17 MED ORDER — BUDESONIDE-FORMOTEROL FUMARATE 160-4.5 MCG/ACT IN AERO: 2.0000 | INHALATION_SPRAY | Freq: Two times a day (BID) | RESPIRATORY_TRACT | 4 refills | Status: AC

## 2024-01-17 MED ORDER — VENTOLIN HFA 108 (90 BASE) MCG/ACT IN AERS: 2.0000 | INHALATION_SPRAY | Freq: Four times a day (QID) | RESPIRATORY_TRACT | 0 refills | Status: DC | PRN

## 2024-01-17 NOTE — Progress Notes (Signed)
 Name: Isaiah Black   Date of Visit: 01/17/2024   Date of last visit with me: Visit date not found   CHIEF COMPLAINT:  Chief Complaint  Patient presents with   Establish Care    New patient. Medication refill. Calves lock up every morning. Right pinky will lock up.        HPI:  Discussed the use of AI scribe software for clinical note transcription with the patient, who gave verbal consent to proceed.  History of Present Illness   Isaiah Black is a 48 year old male who presents for medication refills and evaluation of muscle cramps.  He experiences muscle cramps every morning, particularly affecting his fingers. His fingers 'lock' and cramp, making it difficult to move them, especially during activities like sleight of hand with cards. The cramps are persistent and have not improved with his current hydration habits, which include drinking [facility] and lemonade, but not water or electrolyte-rich drinks.  He discusses a skin condition for which he was previously prescribed an ointment, possibly calcitriol, but it did not provide relief. He recalls being told it was not psoriasis, but cannot remember the specific diagnosis or the healthcare provider he saw for this issue.  He reports that he walked to the appointment and does not report consistent high blood pressure readings outside of this instance. He does not report consistent high blood pressure readings outside of this instance.  His mother has asthma and was recently switched to a different inhaler, which prompts him to consider his own asthma management. He uses an inhaler for his asthma.         OBJECTIVE:       01/17/2024    3:25 PM  Depression screen PHQ 2/9  Decreased Interest 0  Down, Depressed, Hopeless 0  PHQ - 2 Score 0  Altered sleeping 1  Tired, decreased energy 1  Change in appetite 0  Feeling bad or failure about yourself  0  Trouble concentrating 2  Moving slowly or fidgety/restless 0   Suicidal thoughts 0  PHQ-9 Score 4  Difficult doing work/chores Somewhat difficult     BP Readings from Last 3 Encounters:  01/17/24 (!) 132/90  11/16/23 114/82  11/12/23 121/84    BP (!) 132/90   Pulse 75   Ht 5' 5 (1.651 m)   Wt 162 lb 12.8 oz (73.8 kg)   SpO2 97%   BMI 27.09 kg/m    Physical Exam          Physical Exam Constitutional:      Appearance: Normal appearance.  Neurological:     General: No focal deficit present.     Mental Status: He is alert and oriented to person, place, and time. Mental status is at baseline.     ASSESSMENT/PLAN:   Assessment & Plan Moderate persistent asthma without complication  Medication refill  Rash    Assessment and Plan    Moderate persistent asthma Asthma management discussed with emphasis on inhaler use. - Refilled symbicort  and ventolin    Rash Previous treatment with calcitriol was ineffective. Differential diagnosis includes psoriasis, but not confirmed. - Referred to dermatologist for further evaluation and diagnosis.  Medication management Blood pressure slightly elevated, possibly due to recent physical activity. Cramping likely due to electrolyte imbalance. - Sent medications to pharmacy on [facility] - Advised increased intake of electrolytes, such as low-sugar Gatorade, to address cramping.         Isaiah Black A. Vita MD [facility]

## 2024-01-19 ENCOUNTER — Encounter (HOSPITAL_COMMUNITY): Payer: Self-pay

## 2024-01-19 ENCOUNTER — Ambulatory Visit (INDEPENDENT_AMBULATORY_CARE_PROVIDER_SITE_OTHER)

## 2024-01-19 VITALS — BP 121/83 | HR 89 | Temp 98.0°F | Ht 66.0 in | Wt 165.2 lb

## 2024-01-19 DIAGNOSIS — F209 Schizophrenia, unspecified: Secondary | ICD-10-CM | POA: Diagnosis not present

## 2024-01-19 NOTE — Progress Notes (Cosign Needed)
 Patient presented to office for injection of Abilify  Maintena 400 mg. Patient received injection in right deltoid . Patient tolerated injection well. Patient denies SI/HI/AVH. Patient denies any concerns. Patient will return in 28 days.

## 2024-02-06 ENCOUNTER — Ambulatory Visit: Payer: Self-pay | Admitting: Student in an Organized Health Care Education/Training Program

## 2024-02-06 ENCOUNTER — Ambulatory Visit (HOSPITAL_COMMUNITY)
Admission: RE | Admit: 2024-02-06 | Discharge: 2024-02-06 | Disposition: A | Discharge status: Home / Self Care | Source: Ambulatory Visit | Attending: Student in an Organized Health Care Education/Training Program | Admitting: Student in an Organized Health Care Education/Training Program

## 2024-02-06 DIAGNOSIS — R42 Dizziness and giddiness: Secondary | ICD-10-CM | POA: Insufficient documentation

## 2024-02-06 DIAGNOSIS — I349 Nonrheumatic mitral valve disorder, unspecified: Secondary | ICD-10-CM | POA: Diagnosis not present

## 2024-02-06 LAB — ECHOCARDIOGRAM COMPLETE
Area-P 1/2: 3.76 cm2
S' Lateral: 2.9 cm

## 2024-02-16 ENCOUNTER — Ambulatory Visit (INDEPENDENT_AMBULATORY_CARE_PROVIDER_SITE_OTHER): Admitting: Psychiatry

## 2024-02-16 ENCOUNTER — Ambulatory Visit (HOSPITAL_COMMUNITY): Admitting: *Deleted

## 2024-02-16 ENCOUNTER — Other Ambulatory Visit: Payer: Self-pay | Admitting: Family Medicine

## 2024-02-16 ENCOUNTER — Encounter (HOSPITAL_COMMUNITY): Payer: Self-pay

## 2024-02-16 VITALS — BP 125/79 | HR 94 | Ht 66.0 in | Wt 162.0 lb

## 2024-02-16 DIAGNOSIS — F200 Paranoid schizophrenia: Secondary | ICD-10-CM

## 2024-02-16 DIAGNOSIS — F32A Depression, unspecified: Secondary | ICD-10-CM | POA: Diagnosis not present

## 2024-02-16 DIAGNOSIS — F411 Generalized anxiety disorder: Secondary | ICD-10-CM

## 2024-02-16 DIAGNOSIS — F209 Schizophrenia, unspecified: Secondary | ICD-10-CM

## 2024-02-16 DIAGNOSIS — Z760 Encounter for issue of repeat prescription: Secondary | ICD-10-CM

## 2024-02-16 DIAGNOSIS — J4540 Moderate persistent asthma, uncomplicated: Secondary | ICD-10-CM

## 2024-02-16 MED ORDER — HYDROXYZINE HCL 50 MG PO TABS: ORAL_TABLET | ORAL | 3 refills | Status: AC

## 2024-02-16 MED ORDER — BUSPIRONE HCL 15 MG PO TABS: 30.0000 mg | ORAL_TABLET | Freq: Two times a day (BID) | ORAL | 3 refills | Status: AC

## 2024-02-16 MED ORDER — ABILIFY MAINTENA 400 MG IM PRSY: PREFILLED_SYRINGE | INTRAMUSCULAR | 11 refills | Status: AC

## 2024-02-16 NOTE — Progress Notes (Signed)
 [facility] MD/PA/NP OP Progress Note  02/16/2024 8:35 AM Isaiah Black  MRN:  [id number]  Chief Complaint: I have been busy  HPI: 16 old male seen today for follow-up psychiatric evaluation.  He has a psychiatric history of paranoid schizophrenia, anxiety, and disassociation disorder.  He is currently being managed on Buspar  30 mg twice daily, hydroxyzine  50 mg three times daily as needed, and Abilify  400 mg monthly injection. Today he reports that his medications are effective in managing his psychiatric condition.   Today he is well groomed, pleasant, cooperative, engaged in conversation, maintained eye contact.  He informed provider that he has been staying busy.  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 He finds enjoyment in his job.   Since his last visit he notes that his mood, anxiety, depression continues to be well-managed. Today provider contact a GAD-7 and patient scored a 10, at his last visit he scored a 10.  Provider also conducted a PHQ-9 and patient scored a 4, at his last visit he scored a 7.  He endorses adequate sleep and appetite.  Today he denies SI/HI.  Patient continues to have auditory . He notes that he continues to hear chattering. Patient reports that he is able to cope with this.  Today provider conducted an [place] assessment and patient scored a 0.   No medication changes made today.  Patient agreeable to continue medication as prescribed.  No other concerns at this time. Visit Diagnosis:    ICD-10-CM   1. Paranoid schizophrenia (HCC)  F20.0 ARIPiprazole  ER (ABILIFY  MAINTENA) 400 MG PRSY prefilled syringe    2. Generalized anxiety disorder  F41.1 hydrOXYzine  (ATARAX ) 50 MG tablet    busPIRone  (BUSPAR ) 15 MG tablet    3. Mild depression  F32.A busPIRone  (BUSPAR ) 15 MG tablet          Past Psychiatric History: paranoid schizophrenia, anxiety, and disassociation disorder.   Past Medical History:  Past Medical History:  Diagnosis Date    Anxiety    Asthma    Depression    Disassociation disorder    Paranoid schizophrenia (HCC)    No past surgical history on file.  Family Psychiatric History: Paternal uncle schizophrenia, Father Bipolar 1, Maternal aunt Bipolar 1, Niece depression  Family History:  Family History  Problem Relation Age of Onset   Colon cancer Mother        mid 34s   Diabetes Mother    Diabetes Maternal Aunt    Diabetes Maternal Uncle    Aneurysm Maternal Uncle    Heart attack Maternal Grandfather    Stomach cancer Neg Hx    Esophageal cancer Neg Hx     Social History:  Social History   Socioeconomic History   Marital status: Single    Spouse name: Not on file   Number of children: 1   Years of education: Not on file   Highest education level: Not on file  Occupational History   Not on file  Tobacco Use   Smoking status: Former    Types: Cigarettes, E-cigarettes   Smokeless tobacco: Never  Vaping Use   Vaping status: Former  Substance and Sexual Activity   Alcohol use: Not Currently    Comment: occ   Drug use: Not Currently   Sexual activity: Not Currently  Other Topics Concern   Not on file  Social History Narrative   Not on file   Social Drivers of Health   Tobacco Use: Medium  Risk (01/19/2024)   Patient History    Smoking Tobacco Use: Former    Smokeless Tobacco Use: Never    Passive Exposure: Not on file  Financial Resource Strain: Low Risk (01/11/2024)   Overall Financial Resource Strain (CARDIA)    Difficulty of Paying Living Expenses: Not hard at all  Food Insecurity: No Food Insecurity (04/14/2021)   Hunger Vital Sign    Worried About Running Out of Food in the Last Year: Never true    Ran Out of Food in the Last Year: Never true  Transportation Needs: No Transportation Needs (04/14/2021)   PRAPARE - Administrator, Civil Service (Medical): No    Lack of Transportation (Non-Medical): No  Physical Activity: Sufficiently Active (01/11/2024)   Exercise  Vital Sign    Days of Exercise per Week: 7 days    Minutes of Exercise per Session: 60 min  Stress: No Stress Concern Present (01/11/2024)   [employer] of Occupational Health - Occupational Stress Questionnaire    Feeling of Stress: Only a little  Social Connections: Moderately Isolated (01/11/2024)   Social Connection and Isolation Panel    Frequency of Communication with Friends and Family: More than three times a week    Frequency of Social Gatherings with Friends and Family: Twice a week    Attends Religious Services: Never    Database Administrator or Organizations: Yes    Attends Banker Meetings: More than 4 times per year    Marital Status: Never married  Depression (PHQ2-9): Low Risk (02/16/2024)   Depression (PHQ2-9)    PHQ-2 Score: 4  Recent Concern: Depression (PHQ2-9) - Medium Risk (11/24/2023)   Depression (PHQ2-9)    PHQ-2 Score: 7  Alcohol Screen: Low Risk (04/14/2021)   Alcohol Screen    Last Alcohol Screening Score (AUDIT): 0  Housing: Low Risk (04/14/2021)   Housing    Last Housing Risk Score: 0  Utilities: Not At Risk (01/12/2022)   AHC Utilities    Threatened with loss of utilities: No  Health Literacy: Adequate Health Literacy (01/11/2024)   B1300 Health Literacy    Frequency of need for help with medical instructions: Never    Allergies:  Allergies  Allergen Reactions   Cogentin  [Benztropine ]     Metabolic Disorder Labs: Lab Results  Component Value Date   HGBA1C 5.5 01/21/2022   MPG 85.32 03/07/2019   MPG 99.67 07/08/2017   Lab Results  Component Value Date   PROLACTIN 8.6 06/17/2021   PROLACTIN 100.0 (H) 03/07/2019   Lab Results  Component Value Date   CHOL 183 06/17/2021   TRIG 152 (H) 06/17/2021   HDL 49 06/17/2021   CHOLHDL 3.7 06/17/2021   VLDL 10 03/07/2019   LDLCALC 107 (H) 06/17/2021   LDLCALC 80 03/07/2019   Lab Results  Component Value Date   TSH 2.160 01/21/2022   TSH 6.510 (H) 06/17/2021     Therapeutic Level Labs: No results found for: LITHIUM No results found for: VALPROATE No results found for: CBMZ  Current Medications: Current Outpatient Medications  Medication Sig Dispense Refill   ARIPiprazole  ER (ABILIFY  MAINTENA) 400 MG PRSY prefilled syringe INJECT EVERY 28 TO 30 DAYS 1 each 11   betamethasone  valerate ointment (VALISONE ) 0.1 % Apply 1 Application topically 2 (two) times daily. (Patient not taking: Reported on 01/17/2024) 45 g 3   budesonide -formoterol  (SYMBICORT ) 160-4.5 MCG/ACT inhaler Inhale 2 puffs into the lungs 2 (two) times daily. 3 each 4   busPIRone  (  BUSPAR ) 15 MG tablet Take 2 tablets (30 mg total) by mouth 2 (two) times daily. 60 tablet 3   hydrOXYzine  (ATARAX ) 50 MG tablet TAKE 1 TABLET(50 MG) BY MOUTH THREE TIMES DAILY AS NEEDED 90 tablet 3   meclizine  (ANTIVERT ) 25 MG tablet Take 1 tablet (25 mg total) by mouth 3 (three) times daily as needed for dizziness. 30 tablet 0   montelukast  (SINGULAIR ) 10 MG tablet TAKE 1 TABLET (10 MG TOTAL) BY MOUTH AT BEDTIME. 30 tablet 3   VENTOLIN  HFA 108 (90 Base) MCG/ACT inhaler Inhale 2 puffs into the lungs every 6 (six) hours as needed for wheezing or shortness of breath. 18 g 0   Current Facility-Administered Medications  Medication Dose Route Frequency Provider Last Rate Last Admin   0.9 %  sodium chloride  infusion  500 mL Intravenous Once Armbruster, Elspeth SQUIBB, MD       0.9 %  sodium chloride  infusion  500 mL Intravenous Once Armbruster, Elspeth SQUIBB, MD       ARIPiprazole  ER (ABILIFY  MAINTENA) 400 MG prefilled syringe 400 mg  400 mg Intramuscular Q28 days Dasie Ellouise CROME, FNP   400 mg at 02/16/24 [id number]     Musculoskeletal: Strength & Muscle Tone: within normal limits Gait & Station: normal Patient leans: N/A  Psychiatric Specialty Exam: Review of Systems  There were no vitals taken for this visit.There is no height or weight on file to calculate BMI.  General Appearance: Well Groomed  Eye Contact:  Good   Speech:  Clear and Coherent and Normal Rate  Volume:  Normal  Mood:  Euthymic  Affect:  Appropriate and Congruent  Thought Process:  Coherent, Goal Directed, and Linear  Orientation:  Full (Time, Place, and Person)  Thought Content: Logical and Hallucinations: Auditory   Suicidal Thoughts:  No  Homicidal Thoughts:  No  Memory:  Immediate;   Good Recent;   Good Remote;   Good  Judgement:  Good  Insight:  Good  Psychomotor Activity:  Normal  Concentration:  Concentration: Good and Attention Span: Good  Recall:  Good  Fund of Knowledge: Good  Language: Good  Akathisia:  No  Handed:  Right  AIMS (if indicated): done, 0  Assets:  Communication Skills Desire for Improvement Financial Resources/Insurance Housing Leisure Time Physical Health Social Support  ADL's:  Intact  Cognition: WNL  Sleep:  Good   Screenings: AIMS    Flowsheet Row Office Visit from 11/24/2023 in [facility] Office Visit from 05/31/2023 in [facility] Office Visit from 03/08/2023 in [facility] Office Visit from 03/18/2022 in [facility] Clinical Support from 01/20/2022 in [facility]  AIMS Total Score 0 0 0 0 0   AUDIT    Flowsheet Row Admission (Discharged) [facility]06[facility] ([facility]  Alcohol Use Disorder Identification Test Final Score (AUDIT) 25 2   CAGE-AID    Flowsheet Row Counselor from 07/03/2019 in [facility]  CAGE-AID Score 0   GAD-7    Flowsheet Row Office Visit from 02/16/2024 in [facility] Office Visit from 01/17/2024 in [facility] Counselor from 01/11/2024 in [facility] Office Visit from 11/24/2023 in [facility] Counselor from 09/13/2023 in [facility]  Total GAD-7 Score 10 9 5 10  5  [id number]    Flowsheet Row Office Visit from 02/16/2024 in [facility] Office Visit from 01/17/2024 in [facility] Counselor from 01/11/2024 in [facility] Office Visit from 11/24/2023 in [facility] Counselor from 09/13/2023 in [facility]  PHQ-2 Total Score 0 0 0 1 0  PHQ-9 Total Score 4 4 3 7 5    Flowsheet Row Office Visit from 05/31/2023 in [facility] Counselor from 04/27/2023 in [facility] Office Visit from 03/08/2023 in [facility]  C-SSRS RISK CATEGORY No Risk No Risk No Risk     Assessment and Plan: Patient endorses auditory hallucination but notes that he is able to cope with it. He reports that his anxiety, sleep, and depression are well managed.  No medication changes made.  Patient agreeable to continue medication as prescribed. 1. Paranoid schizophrenia (HCC)  Continue- ARIPiprazole  ER (ABILIFY  MAINTENA) 400 MG PRSY prefilled syringe; INJECT EVERY 28 TO 30 DAYS  Dispense: 1 each; Refill: 11  2. Generalized anxiety disorder  Continue- busPIRone  (BUSPAR ) 15 MG tablet; Take 2 tablets (30 mg total) by mouth 2 (two) times daily. Continue- hydrOXYzine  (ATARAX ) 50 MG tablet; TAKE 1 TABLET(50 MG) BY MOUTH THREE TIMES DAILY AS NEEDED  3. Mild depression  Continue- busPIRone  (BUSPAR ) 15 MG tablet; Take 2 tablets (30 mg total) by mouth 2 (two) times daily.   Follow-up in 3 months Zane FORBES Bach, NP 02/16/2024, 8:35 AM

## 2024-02-16 NOTE — Progress Notes (Signed)
 In first thing this am for his monthly inj of ability m 400 mg. Today given in his L Deltoid without issues.He offers no complaints. Seen today by Dr Harl DNP for his assessment of status and any movement concerns. He is to return in 28 days for his next inj.

## 2024-03-15 ENCOUNTER — Ambulatory Visit (HOSPITAL_COMMUNITY)

## 2024-04-20 ENCOUNTER — Encounter: Admitting: Family Medicine

## 2024-09-17 ENCOUNTER — Ambulatory Visit: Admitting: Physician Assistant
